# Patient Record
Sex: Female | Born: 1945
Health system: Southern US, Community
[De-identification: ages and names within clinical notes are randomized; demographics above are authoritative.]

## PROBLEM LIST (undated history)

## (undated) DIAGNOSIS — Z8541 Personal history of malignant neoplasm of cervix uteri: Secondary | ICD-10-CM

## (undated) DIAGNOSIS — N6019 Diffuse cystic mastopathy of unspecified breast: Secondary | ICD-10-CM

## (undated) DIAGNOSIS — I4891 Unspecified atrial fibrillation: Secondary | ICD-10-CM

## (undated) DIAGNOSIS — G629 Polyneuropathy, unspecified: Secondary | ICD-10-CM

## (undated) DIAGNOSIS — E785 Hyperlipidemia, unspecified: Secondary | ICD-10-CM

## (undated) DIAGNOSIS — I10 Essential (primary) hypertension: Secondary | ICD-10-CM

## (undated) DIAGNOSIS — Z8679 Personal history of other diseases of the circulatory system: Secondary | ICD-10-CM

## (undated) DIAGNOSIS — D649 Anemia, unspecified: Secondary | ICD-10-CM

## (undated) HISTORY — PX: BREAST EXCISIONAL BIOPSY: SUR124

## (undated) HISTORY — PX: TONSILLECTOMY: SUR1361

## (undated) HISTORY — DX: Hyperlipidemia, unspecified: E78.5

## (undated) HISTORY — PX: TOTAL ABDOMINAL HYSTERECTOMY: SHX209

## (undated) HISTORY — PX: BREAST CYST EXCISION: SHX579

## (undated) HISTORY — DX: Personal history of malignant neoplasm of cervix uteri: Z85.41

## (undated) HISTORY — DX: Anemia, unspecified: D64.9

## (undated) HISTORY — DX: Personal history of other diseases of the circulatory system: Z86.79

## (undated) HISTORY — PX: APPENDECTOMY: SHX54

## (undated) HISTORY — DX: Essential (primary) hypertension: I10

## (undated) HISTORY — DX: Unspecified atrial fibrillation: I48.91

## (undated) HISTORY — DX: Diffuse cystic mastopathy of unspecified breast: N60.19

## (undated) HISTORY — DX: Polyneuropathy, unspecified: G62.9

---

## 2003-10-29 ENCOUNTER — Encounter: Admission: RE | Admit: 2003-10-29 | Discharge: 2003-10-29 | Payer: Self-pay | Admitting: Internal Medicine

## 2004-11-14 ENCOUNTER — Encounter: Admission: RE | Admit: 2004-11-14 | Discharge: 2004-11-14 | Payer: Self-pay | Admitting: Internal Medicine

## 2005-02-27 ENCOUNTER — Ambulatory Visit: Payer: Self-pay | Admitting: Cardiovascular Disease

## 2005-03-12 ENCOUNTER — Ambulatory Visit: Payer: Self-pay | Admitting: Internal Medicine

## 2005-03-12 ENCOUNTER — Ambulatory Visit: Payer: Self-pay

## 2007-02-04 IMAGING — CT CT HEAD W/O CM
1 of 2 series · 16 of 30 positions shown, 20 images · non-contrast
Comparison: none

HISTORY: Dizziness, syncope, occasional hand numbness, remote history cervical
cancer

[Series 2: head_seq 4.5 h40s st · axial · 0.43mm/px · z∈[-128,-20]mm · 16 of 28 slices shown, 20 images]
[im 2/28  brain]
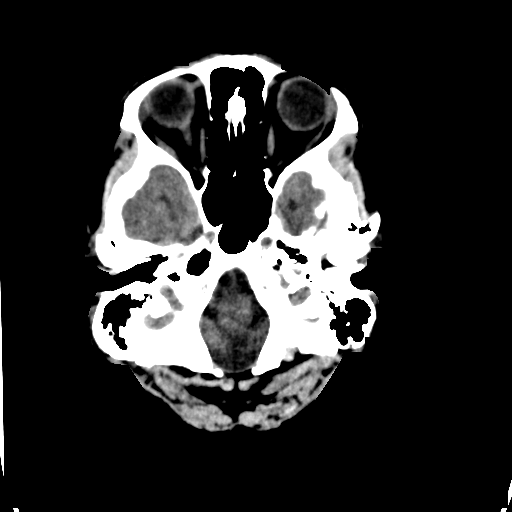
[im 2/28  bone]
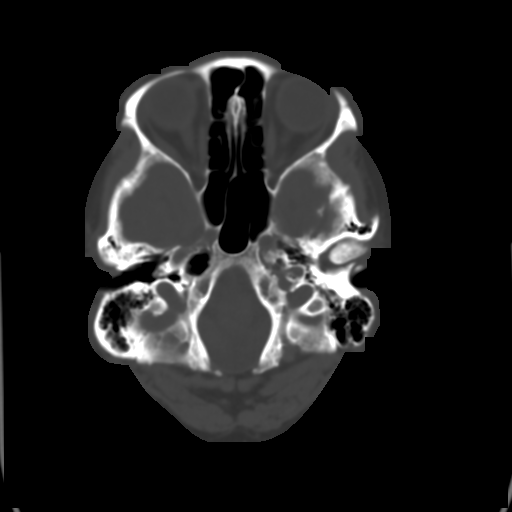
[im 4/28  brain]
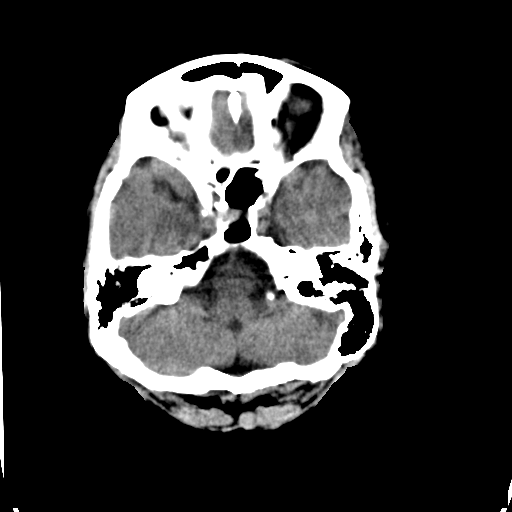
[im 5/28  brain]
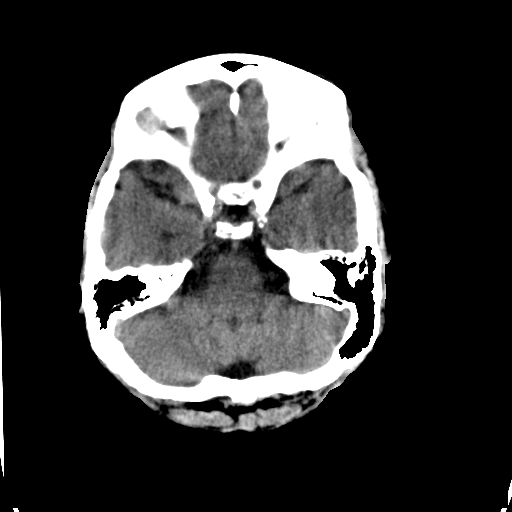
[im 7/28  brain]
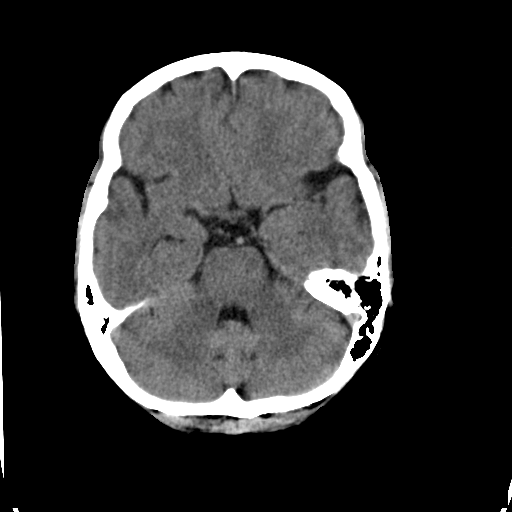
[im 8/28  brain]
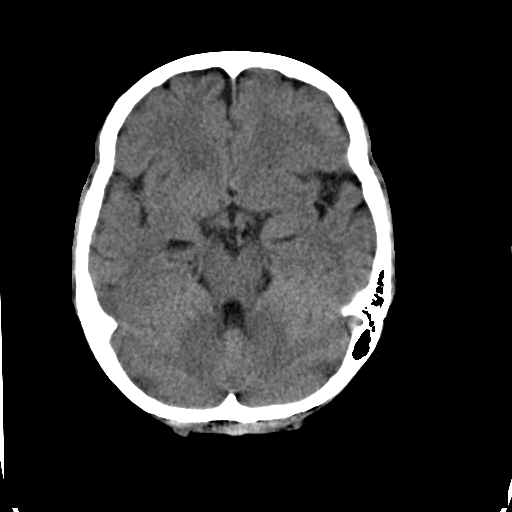
[im 8/28  bone]
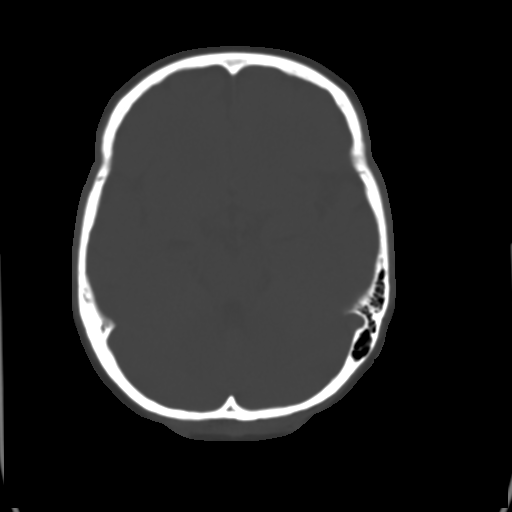
[im 10/28  brain]
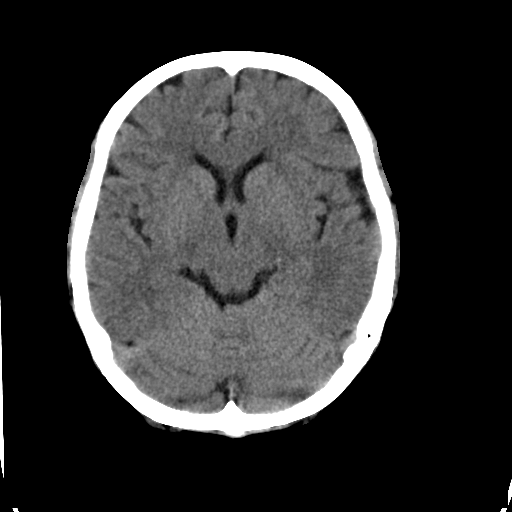
[im 11/28  brain]
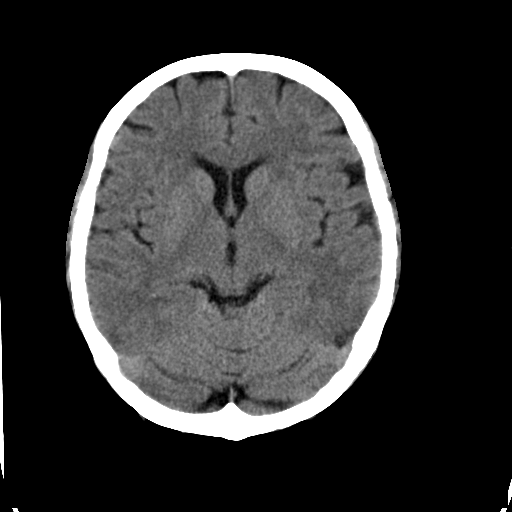
[im 13/28  brain]
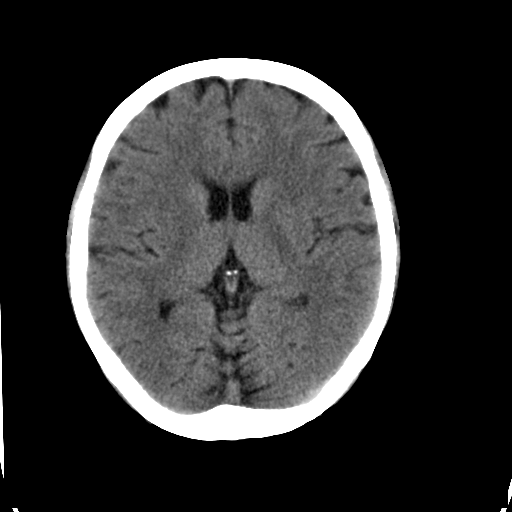
[im 15/28  brain]
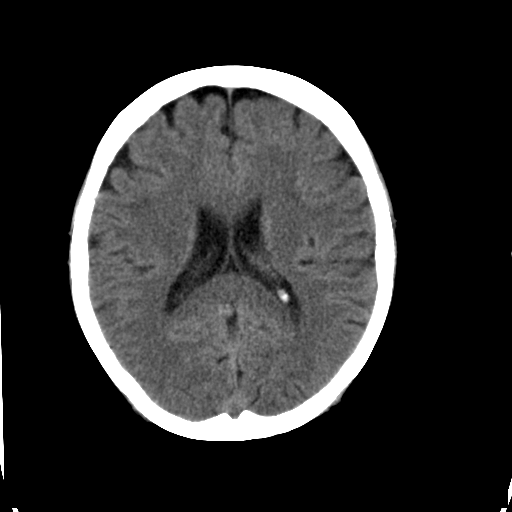
[im 15/28  bone]
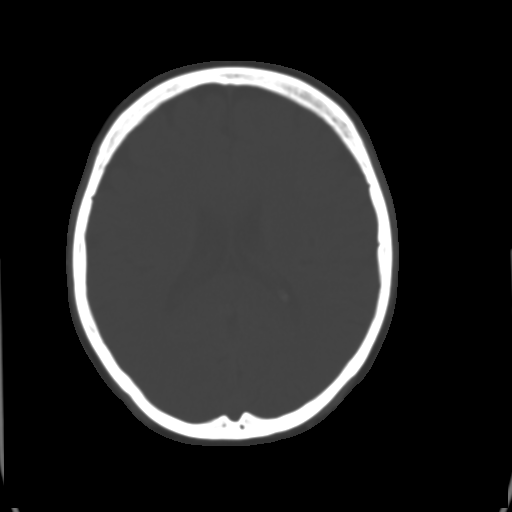
[im 17/28  brain]
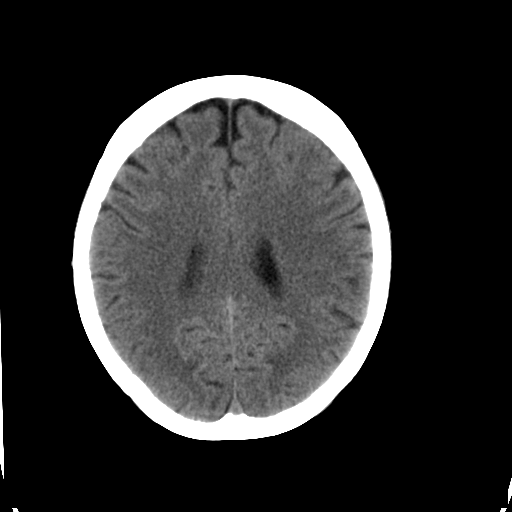
[im 18/28  brain]
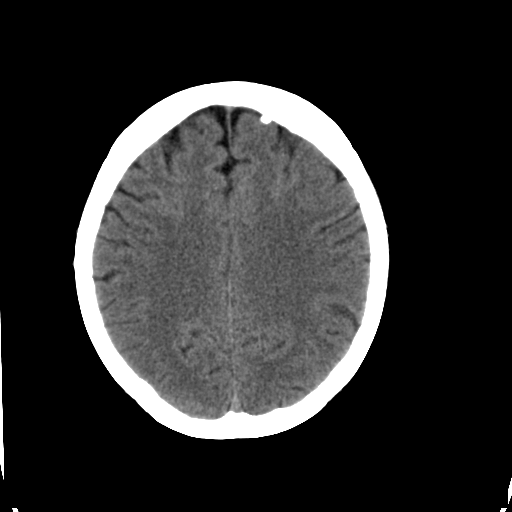
[im 20/28  brain]
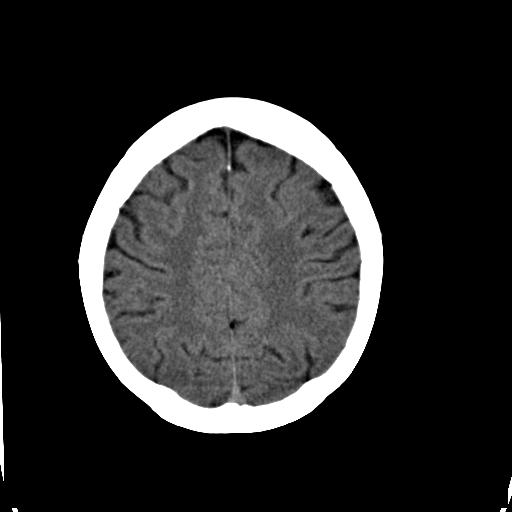
[im 21/28  brain]
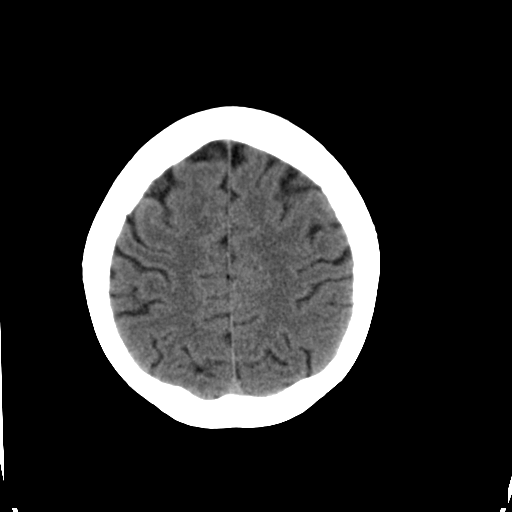
[im 21/28  bone]
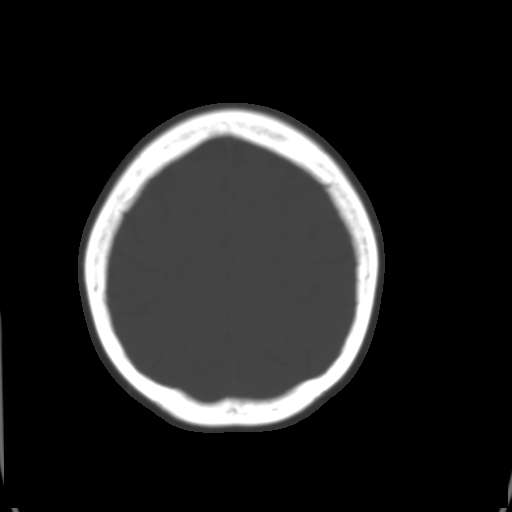
[im 23/28  brain]
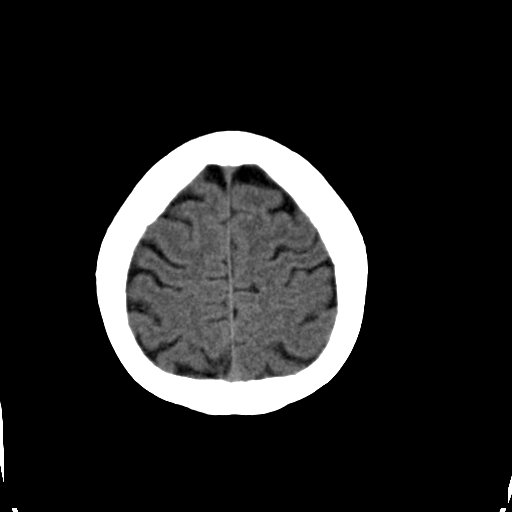
[im 24/28  brain]
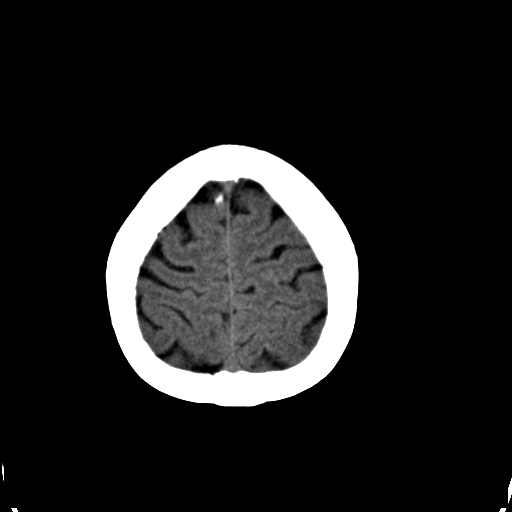
[im 26/28  brain]
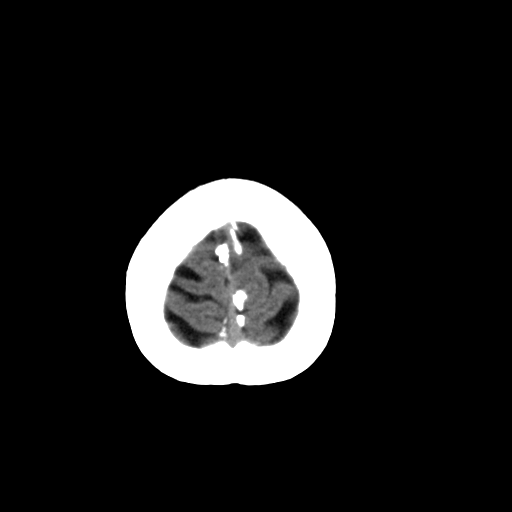

[16 of 30 positions shown; findings below may reference images not displayed]

CT HEAD WITHOUT CONTRAST:

Routine noncontrast CT head without priors for comparison

Normal ventricular morphology.
No midline shift or mass-effect.
Normal appearance of brain parenchyma without mass, hemorrhage, or infarction.
Visualized sinuses clear.
Significant aeration of petrous and temporal bones by mastoid air cells, which
appear clear.
IMPRESSION: No acute intracranial abnormality.

## 2007-04-15 ENCOUNTER — Encounter: Admission: RE | Admit: 2007-04-15 | Discharge: 2007-04-15 | Payer: Self-pay | Admitting: Internal Medicine

## 2007-05-10 ENCOUNTER — Emergency Department (HOSPITAL_COMMUNITY): Admission: EM | Admit: 2007-05-10 | Discharge: 2007-05-10 | Payer: Self-pay | Admitting: Emergency Medicine

## 2007-05-12 ENCOUNTER — Ambulatory Visit: Payer: Self-pay | Admitting: Cardiovascular Disease

## 2007-05-20 ENCOUNTER — Ambulatory Visit: Payer: Self-pay

## 2007-06-10 ENCOUNTER — Ambulatory Visit: Payer: Self-pay | Admitting: Cardiovascular Disease

## 2009-03-29 DIAGNOSIS — I10 Essential (primary) hypertension: Secondary | ICD-10-CM | POA: Insufficient documentation

## 2009-03-29 DIAGNOSIS — Z8541 Personal history of malignant neoplasm of cervix uteri: Secondary | ICD-10-CM | POA: Insufficient documentation

## 2009-03-29 DIAGNOSIS — E785 Hyperlipidemia, unspecified: Secondary | ICD-10-CM | POA: Insufficient documentation

## 2009-03-29 DIAGNOSIS — Z8679 Personal history of other diseases of the circulatory system: Secondary | ICD-10-CM | POA: Insufficient documentation

## 2010-09-07 ENCOUNTER — Encounter: Payer: Self-pay | Admitting: Internal Medicine

## 2010-12-30 NOTE — Assessment & Plan Note (Signed)
[facility]                            CARDIOLOGY OFFICE NOTE   Alicia Rodgers, Alicia Rodgers                         MRN:          [id number]  DATE:06/10/2007                            DOB:          1946/07/09    DATE OF VISIT:  June 10, 2007.   Alicia Rodgers returns today for followup.  She has a history of hypertension  and is much better controlled on Micardis; however, this medicine is  expensive for her and we need to come up with a substitute.  Lisinopril/HCTZ was not effective.  We had switched to Micardis-80 25,  and this was much better.  I told her I would look through the formulary  and see.  She uses the [facility] on [address] in [place].   Coronary risk factors include hypertension and hyperlipidemia.  She has  had no documented coronary artery disease.   She has been compliant with her meds.   REVIEW OF SYSTEMS:  Remarkable for continued dizziness with occasional  nauseated feelings.  This really sounds like more vertiginous or inner  ear problems; however, I cannot rule out temporal lobe migraines.   I think it is reasonable for her to be seen by a neurologist.  She has  had a CT scan before, which did not show any intracranial abnormalities.   From a cardiac perspective, she is otherwise stable.   CURRENT MEDICATIONS:  1. Lipitor 40 a day.  2. An aspirin a day.  3. Lisinopril/HCTZ, which was stopped.  4. Micardis-80 25, which is more effective but needs to substituted      for something cheaper.   PHYSICAL EXAMINATION:  VITAL SIGNS:  Blood pressure of 128/80, pulse is  70 to 85 and regular, afebrile, weight 156.  HEENT:  Unremarkable.  NECK:  Carotids are normal without bruit, no lymphadenopathy, no  thyromegaly, and no JVP elevation.  LUNGS:  Clear with good diaphragmatic motion and no wheezing.  HEART:  There is an S1 and S2 with normal heart sounds, the PMI is  normal.  ABDOMEN:  Benign, bowel sounds are positive, there is no  hepatosplenomegaly or hepatojugular reflux.  EXTREMITIES:  Distal pulses are intact with no edema.  NEURO:  Nonfocal, no muscular weakness.  SKIN:  Warm and dry.   IMPRESSION:  1. Hypertension currently well controlled.  Will try to find a generic      form of angiotensin-II receptor blocker (ARB) with diuretic to      substitute, continue a low salt diet.  2. Hyperlipidemia.  Continue on Lipitor 40 mg a day, a lipid and liver      profile in six months.  3. Vertiginous-like symptoms.  Rule out temporal lobe migraines.      Check electroencephalogram and have the patient see Dr. Marcelino Rodgers in Neurology.   Apparently Santina Evans sees [place], and [place] requested her as the  neurologist.     Theron Arista C. Eden Emms, MD, [facility]  Electronically Signed    PCN/MedQ  DD: 06/10/2007  DT: 06/11/2007  Job #: [id number] [id number]

## 2010-12-30 NOTE — Assessment & Plan Note (Signed)
[facility]                            CARDIOLOGY OFFICE NOTE   JANAUTICA, NETZLEY                         MRN:          [id number]  DATE:05/12/2007                            DOB:          17-Feb-1946    Ms. Alicia Rodgers is seen today as a new patient.  She is 65 years old.  I have  taken care of her husband for a long time.  She was in the emergency  room on May 10, 2007 with dizziness and hypertension.   The patient has had dizziness before.  She has been diagnosed with  tinnitus or vertigo.  She, I believe 2 years ago, had a workup including  an MRI, and there was no evidence of vertebrobasilar insufficiency, CVA,  or cardiac problem.   She does have long-standing hypertension.  She has been on lisinopril  hydrochlorothiazide 10/12.5 for about 10 years.  I am not sure why her  blood pressure would be spiking recently, although she is under a lot of  stress.  She works at [employer]. [employer], which is a [employer].  Apparently, both of her bosses have been sick, and she has been  basically running the company.  This is clearly a stressor for her.  She  was told by the ER doctor to take 2 of her lisinopril pills and followup  with a cardiologist.   Her coronary risk factors include a family history of coronary disease,  hyperlipidemia, and hypertension.  She is a nonsmoker.  Non-diabetic.   She has not had any previous workup with regard to coronary artery  disease or valvular heart disease.  I do not have a recent echo on her.   Her dizziness is not associated with any significant pre-syncope or  falls.  There are no palpitations.  She has occasionally gotten pressure  in her chest, which sounds more like anxiety.  Apparently, Dr. Sudie Bailey,  who knows her in [place], did not want to give her any Xanax until she  saw me.  I think it is okay for her to have some.   I talked to [place] at length.  I think we will switch her to Micardis  80/25  just to simplify her regimen so that she does not have to take 2  pills, and see how she does.  May need to add a second blood pressure  medicine depending on how she reacts to this change.  I told her we  would have her come back in about 2 weeks to reassess her blood pressure  and do a stress Myoview.  The pressure in her chest is related to her  stress levels at work.  It can be exacerbated by walking.  There is no  associated diaphoresis or shortness of breath.  I am not 100% sure it is  not an anginal equivalent and we will check her stress test next week.   REVIEW OF SYSTEMS:  Otherwise, negative.   PAST MEDICAL HISTORY:  Remarkable for hypertension, cervical cancer, and  is otherwise fairly benign.  She is allergic to  Penicillin.  She is  currently taking:  1. Lipitor 40 a day.  2. An aspirin a day.  3. Lisinopril 20/25.   She is happily married.  I have cared for her husband, Alicia Rodgers  for over 14 years.  One of her 3 daughters is with her.  They are a  lovely family from the [place] area.  She does a lot of work around the  house.  She is still working full time at [employer].  She does not smoke  or drink.   FAMILY HISTORY:  Noncontributory, except there is premature coronary  disease on her father's side.   EXAM:  Remarkable for a somewhat anxious white female in no distress.  Her blood pressure is 150/85, pulse 71 and regular, weight is 156.  Respiratory rate is 14. She is afebrile.  Affect is anxious.  HEENT:  Normal.  Carotids normal without bruit.  There is no lymphadenopathy or  thyromegaly.  NO JVP elevation.  LUNGS:  Clear.  Good diaphragmatic motion.  No wheezing.  ABDOMEN:  Benign.  Bowel sounds positive.  There are no renal bruits.  No AAA.  No hepatosplenomegaly.  No hepatojugular reflux.  Femorals are +2 bilaterally without bruit.  PT +3.  There is no lower  extremity edema.  NEURO:  Nonfocal.  SKIN:  Warm and dry.   She does have mild EKG  changes that may represent LVH.  She has slight  ST depression in the inferior lateral leads.   I reviewed her lab work from [facility].  Hematocrit was 38.3,  white count was 5.9, platelet count was 238,000.   She apparently was recently treated for a UTI by Dr. Sudie Bailey.  The UA  was negative for nitrite and leukocyte esterase with no white blood  cells.   IMPRESSION:  1. Hypertension, suspect it is exacerbated by her anxiety levels.      P.r.n. Xanax 0.25 mg a day given, Micardis 80/25 given, follow up      in 2 weeks.  2. Pressure in her chest, likely related to anxiety.  Followup stress      Myoview, particularly in light of her abnormal EKG.  3. Hyperlipidemia.  Continue Lipitor 40 a day.  Lipid and liver      profile per Dr. Sudie Bailey in 6 months.  4. History of cervical cancer.  Follow up with OB/GYN regarding yearly      Pap smears.  5. Recently-treated urinary tract infection.  UA negative in the      emergency room.  Symptoms resolved.  Follow up with primary care      physician.   Other recommendations based on the results of the medication change and  her stress test.     Theron Arista C. Eden Emms, MD, [facility]  Electronically Signed    PCN/MedQ  DD: 05/12/2007  DT: 05/13/2007  Job #: [id number]

## 2011-05-28 LAB — POCT I-STAT CREATININE
Creatinine, Ser: 1
Operator id: 198171

## 2011-05-28 LAB — URINALYSIS, ROUTINE W REFLEX MICROSCOPIC
Glucose, UA: NEGATIVE
Hgb urine dipstick: NEGATIVE
Ketones, ur: NEGATIVE
Protein, ur: NEGATIVE
Urobilinogen, UA: 0.2
pH: 8

## 2011-05-28 LAB — I-STAT 8, (EC8 V) (CONVERTED LAB)
Chloride: 107
Glucose, Bld: 123 — ABNORMAL HIGH
HCT: 41
Operator id: 198171
TCO2: 26
pCO2, Ven: 34.2 — ABNORMAL LOW
pH, Ven: 7.468 — ABNORMAL HIGH

## 2011-05-28 LAB — CBC
Hemoglobin: 13.4
RDW: 13.5

## 2011-05-28 LAB — POCT CARDIAC MARKERS
CKMB, poc: 1.2
Troponin i, poc: <0.05

## 2011-08-21 DIAGNOSIS — J019 Acute sinusitis, unspecified: Secondary | ICD-10-CM | POA: Diagnosis not present

## 2011-08-21 DIAGNOSIS — J209 Acute bronchitis, unspecified: Secondary | ICD-10-CM | POA: Diagnosis not present

## 2011-10-07 DIAGNOSIS — M79609 Pain in unspecified limb: Secondary | ICD-10-CM | POA: Diagnosis not present

## 2011-10-07 DIAGNOSIS — I10 Essential (primary) hypertension: Secondary | ICD-10-CM | POA: Diagnosis not present

## 2011-10-07 DIAGNOSIS — Z79899 Other long term (current) drug therapy: Secondary | ICD-10-CM | POA: Diagnosis not present

## 2011-10-07 DIAGNOSIS — E785 Hyperlipidemia, unspecified: Secondary | ICD-10-CM | POA: Diagnosis not present

## 2011-10-07 DIAGNOSIS — F411 Generalized anxiety disorder: Secondary | ICD-10-CM | POA: Diagnosis not present

## 2011-10-07 DIAGNOSIS — D649 Anemia, unspecified: Secondary | ICD-10-CM | POA: Diagnosis not present

## 2011-11-02 ENCOUNTER — Encounter: Payer: Self-pay | Admitting: Internal Medicine

## 2011-11-09 DIAGNOSIS — D539 Nutritional anemia, unspecified: Secondary | ICD-10-CM | POA: Diagnosis not present

## 2011-11-19 DIAGNOSIS — Z1231 Encounter for screening mammogram for malignant neoplasm of breast: Secondary | ICD-10-CM | POA: Diagnosis not present

## 2011-12-04 ENCOUNTER — Encounter: Payer: Self-pay | Admitting: *Deleted

## 2011-12-04 ENCOUNTER — Encounter: Payer: Self-pay | Admitting: Cardiovascular Disease

## 2011-12-07 ENCOUNTER — Encounter: Payer: Self-pay | Admitting: Cardiovascular Disease

## 2011-12-07 ENCOUNTER — Ambulatory Visit (INDEPENDENT_AMBULATORY_CARE_PROVIDER_SITE_OTHER): Payer: Medicare Other | Admitting: Cardiovascular Disease

## 2011-12-07 VITALS — BP 142/77 | HR 92 | Ht 62.0 in | Wt 153.0 lb

## 2011-12-07 DIAGNOSIS — I10 Essential (primary) hypertension: Secondary | ICD-10-CM

## 2011-12-07 DIAGNOSIS — I4949 Other premature depolarization: Secondary | ICD-10-CM

## 2011-12-07 DIAGNOSIS — E785 Hyperlipidemia, unspecified: Secondary | ICD-10-CM | POA: Diagnosis not present

## 2011-12-07 DIAGNOSIS — I493 Ventricular premature depolarization: Secondary | ICD-10-CM | POA: Insufficient documentation

## 2011-12-07 MED ORDER — NEBIVOLOL HCL 10 MG PO TABS: 10.0000 mg | ORAL_TABLET | Freq: Every day | ORAL | Status: DC

## 2011-12-07 NOTE — Assessment & Plan Note (Signed)
Cholesterol is at goal.  Continue current dose of statin and diet Rx.  No myalgias or side effects.  F/U  LFT's in 6 months. No results found for this basename: LDLCALC             

## 2011-12-07 NOTE — Patient Instructions (Signed)
Start Bystolic 10mg  daily.  Your physician recommends that you schedule a follow-up appointment in: 6 weeks with Dr. Eden Emms.

## 2011-12-07 NOTE — Assessment & Plan Note (Signed)
High adrenergic tone from stress.  Beta blocker should help.  F/U 4-6 weeks

## 2011-12-07 NOTE — Assessment & Plan Note (Signed)
Suboptimally controlled.  Add bystolic 10 mg  F/U 4-6 weeks

## 2011-12-07 NOTE — Progress Notes (Signed)
Alicia Rodgers returns today for followup. She has a history of hypertension and is much better controlled on Micardis; however, this medicine is expensive for her and we need to come up with a substitute. Lisinopril/HCTZ was not effective. We had switched to Micardis-80 25, and this was much better. I told her I would look through the formulary and see. She uses the [facility] on [address] in [place]. Coronary risk factors include hypertension and hyperlipidemia. She has had no documented coronary artery disease. She has been compliant with her meds.  Under a lot of stress Expenses at home.  Thinking of retiring  Wants to be close to grand babies at [facility]. Had lab work a month ago with primary in [place]  Anemic and to have colonoscopy with Dr Juanda Chance next month  ROS: Denies fever, malais, weight loss, blurry vision, decreased visual acuity, cough, sputum, SOB, hemoptysis, pleuritic pain, palpitaitons, heartburn, abdominal pain, melena, lower extremity edema, claudication, or rash.  All other systems reviewed and negative  General: Affect appropriate Healthy:  appears stated age HEENT: normal Neck supple with no adenopathy JVP normal no bruits no thyromegaly Lungs clear with no wheezing and good diaphragmatic motion Heart:  S1/S2 no murmur, no rub, gallop or click PMI normal Abdomen: benighn, BS positve, no tenderness, no AAA no bruit.  No HSM or HJR Distal pulses intact with no bruits No edema Neuro non-focal Skin warm and dry No muscular weakness   No current outpatient prescriptions on file.    Allergies  Review of patient's allergies indicates not on file.  Electrocardiogram: NSR rate 92  PVC's nonspecific ST/T wave changes  Assessment and Plan

## 2011-12-09 DIAGNOSIS — E7800 Pure hypercholesterolemia, unspecified: Secondary | ICD-10-CM | POA: Diagnosis not present

## 2011-12-09 DIAGNOSIS — R002 Palpitations: Secondary | ICD-10-CM | POA: Diagnosis not present

## 2011-12-09 DIAGNOSIS — K219 Gastro-esophageal reflux disease without esophagitis: Secondary | ICD-10-CM | POA: Diagnosis not present

## 2011-12-09 DIAGNOSIS — I4891 Unspecified atrial fibrillation: Secondary | ICD-10-CM | POA: Diagnosis not present

## 2011-12-09 DIAGNOSIS — I4949 Other premature depolarization: Secondary | ICD-10-CM | POA: Diagnosis not present

## 2011-12-09 DIAGNOSIS — Z79899 Other long term (current) drug therapy: Secondary | ICD-10-CM | POA: Diagnosis not present

## 2011-12-09 DIAGNOSIS — F411 Generalized anxiety disorder: Secondary | ICD-10-CM | POA: Diagnosis not present

## 2011-12-09 DIAGNOSIS — Z7982 Long term (current) use of aspirin: Secondary | ICD-10-CM | POA: Diagnosis not present

## 2011-12-10 NOTE — Progress Notes (Signed)
Addended by: Reine Just on: 12/10/2011 04:08 PM   Modules accepted: Orders

## 2011-12-11 DIAGNOSIS — H6120 Impacted cerumen, unspecified ear: Secondary | ICD-10-CM | POA: Diagnosis not present

## 2011-12-11 DIAGNOSIS — F411 Generalized anxiety disorder: Secondary | ICD-10-CM | POA: Diagnosis not present

## 2011-12-11 DIAGNOSIS — R002 Palpitations: Secondary | ICD-10-CM | POA: Diagnosis not present

## 2011-12-21 ENCOUNTER — Encounter: Payer: Self-pay | Admitting: *Deleted

## 2011-12-21 DIAGNOSIS — H6120 Impacted cerumen, unspecified ear: Secondary | ICD-10-CM | POA: Diagnosis not present

## 2011-12-29 ENCOUNTER — Encounter: Payer: Self-pay | Admitting: Internal Medicine

## 2011-12-29 ENCOUNTER — Ambulatory Visit (INDEPENDENT_AMBULATORY_CARE_PROVIDER_SITE_OTHER): Payer: Medicare Other | Admitting: Internal Medicine

## 2011-12-29 VITALS — BP 114/62 | HR 64 | Ht 62.0 in | Wt 152.8 lb

## 2011-12-29 DIAGNOSIS — D509 Iron deficiency anemia, unspecified: Secondary | ICD-10-CM | POA: Diagnosis not present

## 2011-12-29 MED ORDER — FERROUS SULFATE 325 (65 FE) MG PO TBEC: 325.0000 mg | DELAYED_RELEASE_TABLET | Freq: Every day | ORAL | Status: AC

## 2011-12-29 MED ORDER — PEG-KCL-NACL-NASULF-NA ASC-C 100 G PO SOLR: 1.0000 | Freq: Once | ORAL | Status: DC

## 2011-12-29 NOTE — Progress Notes (Signed)
Alicia Rodgers 1946/03/13 MRN [id number]    History of Present Illness:  This is a 66 year old white female with mild anemia. In February 2013, her hemoglobin was 11.4 and hematocrit was 35.3. According to our records, she had a hemoglobin of 13.9 in 2008. She denies any rectal bleeding, abdominal pain or family history of colon cancer. Her husband passed away 3 years ago and she has not been taking care of herself. She has had recent palpitations which are in the process of being evaluated by Dr.Nishan. She has never had a colonoscopy. She has been treated for high blood pressure. There is a history of cervical carcinoma for which she is status post radical hysterectomy at age 77. There is no history of radiation. She denies taking any nonsteroidal agents or aspirin.   Past Medical History  Diagnosis Date  . HYPERLIPIDEMIA   . HYPERTENSION   . CERVICAL CANCER, HX OF   . RHEUMATIC FEVER, HX OF   . Anemia   . Fibrocystic breast    Past Surgical History  Procedure Date  . Total abdominal hysterectomy   . Breast cyst excision   . Tonsillectomy   . Cesarean section     x3  . Appendectomy     reports that she has never smoked. She has never used smokeless tobacco. She reports that she does not drink alcohol or use illicit drugs. family history includes Breast cancer in her paternal aunt; Heart disease in her father; Stroke in her mother; and Thyroid cancer in her paternal aunt.  There is no history of Colon cancer. Allergies  Allergen Reactions  . Penicillins         Review of Systems:Denies symptoms of reflux. Dysphagia chest pain, positive for palpitations  The remainder of the 10 point ROS is negative except as outlined in H&P   Physical Exam: General appearance  Well developed, in no distress. Eyes- non icteric. HEENT nontraumatic, normocephalic. Mouth no lesions, tongue papillated, no cheilosis. Neck supple without adenopathy, thyroid not enlarged, no carotid bruits, no  JVD. Lungs Clear to auscultation bilaterally. Cor normal S1, normal S2, regular rhythm, no murmur,  quiet precordium. Abdomen: Soft nontender with normal active bowel sounds. No distention. Liver edge at costal margin. Well-healed surgical scar below umbilicus. Rectal:Soft Hemoccult negative stool. Small external hemorrhoidal tags.  Extremities no pedal edema. Skin no lesions. Neurological alert and oriented x 3. Psychological normal mood and affect.  Assessment and Plan:  Problem #75 66 year old white female with mild anemia. She had surgical menopause at age 58. She has a history of cervical cancer. There are no overt symptoms of GI malignancy such as abdominal pain or Hemoccult positive stool. She has never had a colonoscopy. She is a good candidate for a screening colonoscopy. We have discussed the procedure, the prep and the sedation. She would like to have the colonoscopy done after she sees Dr.Nishan in June. I asked her to take over-the-counter iron supplements and stop it one week prior to the scheduled colonoscopy.   12/29/2011 Alicia Rodgers

## 2011-12-29 NOTE — Patient Instructions (Signed)
You have been scheduled for a colonoscopy with propofol. Please follow written instructions given to you at your visit today.  Please pick up your prep kit at the pharmacy within the next 1-3 days. Please purchase ferrous sulfate 325 mg (Iron) over the counter and take once daily. Please hold ferrous sulfate 1 week prior to colonoscopy as per Dr Regino Schultze recommendations. CC: Dr Sudie Bailey

## 2012-01-25 ENCOUNTER — Ambulatory Visit (INDEPENDENT_AMBULATORY_CARE_PROVIDER_SITE_OTHER): Payer: Medicare Other | Admitting: Cardiovascular Disease

## 2012-01-25 ENCOUNTER — Encounter: Payer: Self-pay | Admitting: Cardiovascular Disease

## 2012-01-25 VITALS — BP 138/72 | HR 74 | Ht 62.0 in | Wt 152.0 lb

## 2012-01-25 DIAGNOSIS — I493 Ventricular premature depolarization: Secondary | ICD-10-CM

## 2012-01-25 DIAGNOSIS — E785 Hyperlipidemia, unspecified: Secondary | ICD-10-CM

## 2012-01-25 DIAGNOSIS — I4949 Other premature depolarization: Secondary | ICD-10-CM | POA: Diagnosis not present

## 2012-01-25 DIAGNOSIS — I10 Essential (primary) hypertension: Secondary | ICD-10-CM | POA: Diagnosis not present

## 2012-01-25 MED ORDER — ATENOLOL 50 MG PO TABS: 50.0000 mg | ORAL_TABLET | Freq: Every day | ORAL | Status: DC

## 2012-01-25 NOTE — Assessment & Plan Note (Signed)
Cholesterol is at goal.  Continue current dose of statin and diet Rx.  No myalgias or side effects.  F/U  LFT's in 6 months. No results found for this basename: LDLCALC             

## 2012-01-25 NOTE — Assessment & Plan Note (Signed)
Well controlled.  Continue current medications and low sodium Dash type diet.   Change bystolic to atenolol for cost

## 2012-01-25 NOTE — Patient Instructions (Signed)
Your physician wants you to follow-up in:  6 MONTHS WITH DR Haywood Filler will receive a reminder letter in the mail two months in advance. If you don't receive a letter, please call our office to schedule the follow-up appointment. Your physician has recommended you make the following change in your medication: STOP BYSTOLIC 10 MG AND START GEN ATENOLOL 50 MG  EVERY DAY

## 2012-01-25 NOTE — Assessment & Plan Note (Signed)
Stable palpitations only when she is anxious  Continue beta blocker

## 2012-01-25 NOTE — Progress Notes (Signed)
Patient ID: CHANNA HAZELETT, female   DOB: 10/21/1945, 66 y.o.   MRN: [id number] Ms. Matherne returns today for followup. She has a history of hypertension and is much better controlled on Micardis; however, this medicine is expensive for her and we need to come up with a substitute. Lisinopril/HCTZ was not effective. Currently on cozaar and diuretic  Bystolic too expensive and will need to change  Coronary risk factors include hypertension and hyperlipidemia. She has had no documented coronary artery disease. She has been compliant with her meds. Under a lot of stress Expenses at home. Thinking of retiring Wants to be close to grand babies at [facility]. Had lab work a month ago with primary in [place] Anemic  Colonoscopy next month.  Still some melancholy days.  Daughter to be married next April and she may move to [place] after wedding.  Her twin grandsons are now 3  ROS: Denies fever, malais, weight loss, blurry vision, decreased visual acuity, cough, sputum, SOB, hemoptysis, pleuritic pain, palpitaitons, heartburn, abdominal pain, melena, lower extremity edema, claudication, or rash.  All other systems reviewed and negative  General: Affect appropriate Healthy:  appears stated age HEENT: normal Neck supple with no adenopathy JVP normal no bruits no thyromegaly Lungs clear with no wheezing and good diaphragmatic motion Heart:  S1/S2 no murmur, no rub, gallop or click PMI normal Abdomen: benighn, BS positve, no tenderness, no AAA no bruit.  No HSM or HJR Distal pulses intact with no bruits No edema Neuro non-focal Skin warm and dry No muscular weakness   Current Outpatient Prescriptions  Medication Sig Dispense Refill  . ALPRAZolam (XANAX) 0.5 MG tablet Take 0.5 mg by mouth at bedtime as needed.      Marland Kitchen aspirin 81 MG tablet Take 81 mg by mouth daily.      . cetirizine (ZYRTEC) 10 MG tablet Take 10 mg by mouth as needed.      . Cholecalciferol 1000 UNITS capsule Take 1,000 Units by mouth daily.       . cyanocobalamin 500 MCG tablet Take 1,000 mcg by mouth daily.      . ferrous sulfate 325 (65 FE) MG EC tablet Take 1 tablet (325 mg total) by mouth daily.  1 tablet  0  . losartan (COZAAR) 100 MG tablet Take 100 mg by mouth daily.      . nebivolol (BYSTOLIC) 10 MG tablet Take 1 tablet (10 mg total) by mouth daily.  30 tablet  3  . omeprazole (PRILOSEC) 40 MG capsule Take 40 mg by mouth as needed.      . peg 3350 powder (MOVIPREP) 100 G SOLR Take 1 kit (100 g total) by mouth once. As directed  1 kit  0  . pravastatin (PRAVACHOL) 10 MG tablet Take 10 mg by mouth every other day.       . triamterene-hydrochlorothiazide (DYAZIDE) 37.5-25 MG per capsule Take 1 capsule by mouth every morning.        Allergies  Penicillins  Electrocardiogram:  Assessment and Plan

## 2012-02-04 DIAGNOSIS — E559 Vitamin D deficiency, unspecified: Secondary | ICD-10-CM | POA: Diagnosis not present

## 2012-02-04 DIAGNOSIS — D509 Iron deficiency anemia, unspecified: Secondary | ICD-10-CM | POA: Diagnosis not present

## 2012-02-04 DIAGNOSIS — Z79899 Other long term (current) drug therapy: Secondary | ICD-10-CM | POA: Diagnosis not present

## 2012-02-04 DIAGNOSIS — F411 Generalized anxiety disorder: Secondary | ICD-10-CM | POA: Diagnosis not present

## 2012-02-04 DIAGNOSIS — I10 Essential (primary) hypertension: Secondary | ICD-10-CM | POA: Diagnosis not present

## 2012-02-04 DIAGNOSIS — E785 Hyperlipidemia, unspecified: Secondary | ICD-10-CM | POA: Diagnosis not present

## 2012-02-19 ENCOUNTER — Encounter: Payer: Medicare Other | Admitting: Internal Medicine

## 2012-02-23 ENCOUNTER — Telehealth: Payer: Self-pay | Admitting: *Deleted

## 2012-02-23 NOTE — Telephone Encounter (Signed)
Pt called concerned because when she last saw dr Eden Emms he changed her bystolic to atenolol and the dosage went from 10 mg to 50 mg. Explained to pt was a difference in the meds. She will start with just 1/2 tablet of atenolol daily. She will track her bp and if elevated she can then increase to 50 mg daily. She will track her bp and bring the readings top the next appt with dr Eden Emms.

## 2012-03-07 DIAGNOSIS — R7989 Other specified abnormal findings of blood chemistry: Secondary | ICD-10-CM | POA: Diagnosis not present

## 2012-05-06 DIAGNOSIS — I10 Essential (primary) hypertension: Secondary | ICD-10-CM | POA: Diagnosis not present

## 2012-05-06 DIAGNOSIS — F411 Generalized anxiety disorder: Secondary | ICD-10-CM | POA: Diagnosis not present

## 2012-05-06 DIAGNOSIS — E785 Hyperlipidemia, unspecified: Secondary | ICD-10-CM | POA: Diagnosis not present

## 2012-05-06 DIAGNOSIS — K219 Gastro-esophageal reflux disease without esophagitis: Secondary | ICD-10-CM | POA: Diagnosis not present

## 2012-05-19 DIAGNOSIS — N390 Urinary tract infection, site not specified: Secondary | ICD-10-CM | POA: Diagnosis not present

## 2012-06-08 DIAGNOSIS — Z23 Encounter for immunization: Secondary | ICD-10-CM | POA: Diagnosis not present

## 2012-06-14 DIAGNOSIS — J209 Acute bronchitis, unspecified: Secondary | ICD-10-CM | POA: Diagnosis not present

## 2012-06-14 DIAGNOSIS — Z79899 Other long term (current) drug therapy: Secondary | ICD-10-CM | POA: Diagnosis not present

## 2012-06-14 DIAGNOSIS — F411 Generalized anxiety disorder: Secondary | ICD-10-CM | POA: Diagnosis not present

## 2012-06-14 DIAGNOSIS — D649 Anemia, unspecified: Secondary | ICD-10-CM | POA: Diagnosis not present

## 2012-06-14 DIAGNOSIS — I10 Essential (primary) hypertension: Secondary | ICD-10-CM | POA: Diagnosis not present

## 2012-06-14 DIAGNOSIS — E785 Hyperlipidemia, unspecified: Secondary | ICD-10-CM | POA: Diagnosis not present

## 2012-06-20 DIAGNOSIS — D649 Anemia, unspecified: Secondary | ICD-10-CM | POA: Diagnosis not present

## 2012-07-18 DIAGNOSIS — Z79899 Other long term (current) drug therapy: Secondary | ICD-10-CM | POA: Diagnosis not present

## 2012-07-27 ENCOUNTER — Ambulatory Visit (INDEPENDENT_AMBULATORY_CARE_PROVIDER_SITE_OTHER): Payer: Medicare Other | Admitting: Cardiovascular Disease

## 2012-07-27 ENCOUNTER — Encounter: Payer: Self-pay | Admitting: Cardiovascular Disease

## 2012-07-27 VITALS — BP 159/84 | HR 65 | Resp 16 | Ht 62.0 in | Wt 153.0 lb

## 2012-07-27 DIAGNOSIS — E785 Hyperlipidemia, unspecified: Secondary | ICD-10-CM

## 2012-07-27 DIAGNOSIS — I493 Ventricular premature depolarization: Secondary | ICD-10-CM

## 2012-07-27 DIAGNOSIS — I10 Essential (primary) hypertension: Secondary | ICD-10-CM

## 2012-07-27 DIAGNOSIS — I4949 Other premature depolarization: Secondary | ICD-10-CM

## 2012-07-27 NOTE — Assessment & Plan Note (Signed)
Well controlled.  Continue current medications and low sodium Dash type diet.   Component of white coat.  Samples bystolic given

## 2012-07-27 NOTE — Assessment & Plan Note (Signed)
Resolved no palpitations continue beta blocker

## 2012-07-27 NOTE — Assessment & Plan Note (Signed)
Cholesterol is at goal.  Continue current dose of statin and diet Rx.  No myalgias or side effects.  F/U  LFT's in 6 months. No results found for this basename: LDLCALC  Labs with primary            

## 2012-07-27 NOTE — Progress Notes (Signed)
Patient ID: Alicia Rodgers, female   DOB: [birth year], 66 y.o.   MRN: [id number] Alicia Rodgers returns today for followup. She has a history of hypertension and is much better controlled on Micardis; however, this medicine is expensive for her and we need to come up with a substitute. Lisinopril/HCTZ was not effective. Currently on cozaar and diuretic Bystolic too expensive and will need to change Coronary risk factors include hypertension and hyperlipidemia. She has had no documented coronary artery disease. She has been compliant with her meds. Under a lot of stress Expenses at home. Thinking of retiring Wants to be close to grand babies at [facility]. Had lab work a month ago with primary in [place] Anemic Colonoscopy next month.  Still some melancholy days. Daughter to be married next April and she may move to [place] after wedding. Her twin grandsons are now 76  Alicia Rodgers and Alicia Rodgers will be moving this way in January  ROS: Denies fever, malais, weight loss, blurry vision, decreased visual acuity, cough, sputum, SOB, hemoptysis, pleuritic pain, palpitaitons, heartburn, abdominal pain, melena, lower extremity edema, claudication, or rash.  All other systems reviewed and negative  General: Affect appropriate Healthy:  appears stated age HEENT: normal Neck supple with no adenopathy JVP normal no bruits no thyromegaly Lungs clear with no wheezing and good diaphragmatic motion Heart:  S1/S2 no murmur, no rub, gallop or click PMI normal Abdomen: benighn, BS positve, no tenderness, no AAA no bruit.  No HSM or HJR Distal pulses intact with no bruits No edema Neuro non-focal Skin warm and dry No muscular weakness   Current Outpatient Prescriptions  Medication Sig Dispense Refill  . ALPRAZolam (XANAX) 0.5 MG tablet Take 0.5 mg by mouth at bedtime as needed.      Marland Kitchen aspirin 81 MG tablet Take 81 mg by mouth daily.      Marland Kitchen atenolol (TENORMIN) 50 MG tablet Take 1 tablet (50 mg total) by mouth daily.  30 tablet  11   . cetirizine (ZYRTEC) 10 MG tablet Take 10 mg by mouth as needed.      . Cholecalciferol 1000 UNITS capsule Take 1,000 Units by mouth daily.      . cyanocobalamin 500 MCG tablet Take 1,000 mcg by mouth daily.      . ferrous sulfate 325 (65 FE) MG EC tablet Take 1 tablet (325 mg total) by mouth daily.  1 tablet  0  . hydrochlorothiazide (HYDRODIURIL) 12.5 MG tablet Take 12.5 mg by mouth daily.      Marland Kitchen losartan (COZAAR) 100 MG tablet Take 100 mg by mouth daily.      . nebivolol (BYSTOLIC) 10 MG tablet Take 10 mg by mouth daily.      Marland Kitchen omeprazole (PRILOSEC) 40 MG capsule Take 40 mg by mouth as needed.      . pravastatin (PRAVACHOL) 10 MG tablet Take 10 mg by mouth every other day.         Allergies  Penicillins  Electrocardiogram:  NSR normal ECG no LVH 12/07/11  Assessment and Plan

## 2012-07-27 NOTE — Patient Instructions (Addendum)
Your physician recommends that you continue on your current medications as directed. Please refer to the Current Medication list given to you today.  Your physician wants you to follow-up in: 6 months. You will receive a reminder letter in the mail two months in advance. If you don't receive a letter, please call our office to schedule the follow-up appointment.  

## 2012-08-16 DIAGNOSIS — R7989 Other specified abnormal findings of blood chemistry: Secondary | ICD-10-CM | POA: Diagnosis not present

## 2012-08-16 DIAGNOSIS — Z79899 Other long term (current) drug therapy: Secondary | ICD-10-CM | POA: Diagnosis not present

## 2012-09-22 DIAGNOSIS — E785 Hyperlipidemia, unspecified: Secondary | ICD-10-CM | POA: Diagnosis not present

## 2012-09-22 DIAGNOSIS — J209 Acute bronchitis, unspecified: Secondary | ICD-10-CM | POA: Diagnosis not present

## 2012-09-22 DIAGNOSIS — I10 Essential (primary) hypertension: Secondary | ICD-10-CM | POA: Diagnosis not present

## 2012-09-22 DIAGNOSIS — Z79899 Other long term (current) drug therapy: Secondary | ICD-10-CM | POA: Diagnosis not present

## 2012-09-22 DIAGNOSIS — F411 Generalized anxiety disorder: Secondary | ICD-10-CM | POA: Diagnosis not present

## 2012-09-22 DIAGNOSIS — R062 Wheezing: Secondary | ICD-10-CM | POA: Diagnosis not present

## 2012-11-04 DIAGNOSIS — M62838 Other muscle spasm: Secondary | ICD-10-CM | POA: Diagnosis not present

## 2012-11-04 DIAGNOSIS — Z79899 Other long term (current) drug therapy: Secondary | ICD-10-CM | POA: Diagnosis not present

## 2012-11-04 DIAGNOSIS — I10 Essential (primary) hypertension: Secondary | ICD-10-CM | POA: Diagnosis not present

## 2012-11-04 DIAGNOSIS — E785 Hyperlipidemia, unspecified: Secondary | ICD-10-CM | POA: Diagnosis not present

## 2012-11-04 DIAGNOSIS — H6980 Other specified disorders of Eustachian tube, unspecified ear: Secondary | ICD-10-CM | POA: Diagnosis not present

## 2012-11-04 DIAGNOSIS — K219 Gastro-esophageal reflux disease without esophagitis: Secondary | ICD-10-CM | POA: Diagnosis not present

## 2013-01-31 ENCOUNTER — Ambulatory Visit (INDEPENDENT_AMBULATORY_CARE_PROVIDER_SITE_OTHER): Payer: Medicare Other | Admitting: Cardiovascular Disease

## 2013-01-31 ENCOUNTER — Encounter: Payer: Self-pay | Admitting: Cardiovascular Disease

## 2013-01-31 DIAGNOSIS — I10 Essential (primary) hypertension: Secondary | ICD-10-CM | POA: Diagnosis not present

## 2013-01-31 DIAGNOSIS — E785 Hyperlipidemia, unspecified: Secondary | ICD-10-CM

## 2013-01-31 DIAGNOSIS — I493 Ventricular premature depolarization: Secondary | ICD-10-CM

## 2013-01-31 DIAGNOSIS — I4949 Other premature depolarization: Secondary | ICD-10-CM | POA: Diagnosis not present

## 2013-01-31 MED ORDER — ATENOLOL 25 MG PO TABS: 25.0000 mg | ORAL_TABLET | Freq: Every day | ORAL | Status: DC

## 2013-01-31 NOTE — Assessment & Plan Note (Signed)
Cholesterol is at goal.  Continue current dose of statin and diet Rx.  No myalgias or side effects.  F/U  LFT's in 6 months. No results found for this basename: LDLCALC  Labs with primary            

## 2013-01-31 NOTE — Assessment & Plan Note (Signed)
Benign SEM  Consider f/u echo in future

## 2013-01-31 NOTE — Assessment & Plan Note (Signed)
Resolved continue low dose beta blocker Try Atenolol which will be cheaper than bystolic

## 2013-01-31 NOTE — Progress Notes (Signed)
Patient ID: Alicia Rodgers, female   DOB: November 04, 1945, 66 y.o.   MRN: [id number] Ms. Mcclinton returns today for followup. She has a history of hypertension and is much better controlled on Micardis; however, this medicine is expensive for her and we need to come up with a substitute. Lisinopril/HCTZ was not effective. Currently on cozaar and diuretic Bystolic too expensive  Coronary risk factors include hypertension and hyperlipidemia. She has had no documented coronary artery disease. She has been compliant with her meds. Under a lot of stress Expenses at home. Thinking of retiring Wants to be close to grand babies at [facility]. Had lab work a month ago with primary in [place] Anemic Colonoscopy next month.  Still some melancholy daysDaughter married in April and she may move to [place] after wedding. Her twin grandsons are now 18  [place] and [name] living in [place] now   ROS: Denies fever, malais, weight loss, blurry vision, decreased visual acuity, cough, sputum, SOB, hemoptysis, pleuritic pain, palpitaitons, heartburn, abdominal pain, melena, lower extremity edema, claudication, or rash.  All other systems reviewed and negative  General: Affect appropriate Healthy:  appears stated age HEENT: normal Neck supple with no adenopathy JVP normal no bruits no thyromegaly Lungs clear with no wheezing and good diaphragmatic motion Heart:  S1/S2 SEM  murmur, no rub, gallop or click PMI normal Abdomen: benighn, BS positve, no tenderness, no AAA no bruit.  No HSM or HJR Distal pulses intact with no bruits No edema Neuro non-focal Skin warm and dry No muscular weakness   Current Outpatient Prescriptions  Medication Sig Dispense Refill  . ALPRAZolam (XANAX) 0.5 MG tablet Take 0.5 mg by mouth at bedtime as needed.      Marland Kitchen aspirin 81 MG tablet Take 81 mg by mouth daily.      . Cholecalciferol 1000 UNITS capsule Take 1,000 Units by mouth daily.      . cyanocobalamin 500 MCG tablet Take 1,000 mcg by mouth  daily.      Marland Kitchen escitalopram (LEXAPRO) 5 MG tablet Take 5 mg by mouth daily.      . hydrochlorothiazide (HYDRODIURIL) 12.5 MG tablet Take 12.5 mg by mouth daily.      Marland Kitchen losartan (COZAAR) 100 MG tablet Take 100 mg by mouth daily.      . nebivolol (BYSTOLIC) 10 MG tablet Take 10 mg by mouth daily.      . pravastatin (PRAVACHOL) 10 MG tablet Take 10 mg by mouth every other day.        No current facility-administered medications for this visit.    Allergies  Penicillins  Electrocardiogram:  SR rate 64  Nonspecific ST/T wave changes   Assessment and Plan

## 2013-01-31 NOTE — Assessment & Plan Note (Addendum)
Well controlled.  Continue current medications and low sodium Dash type diet.  Script for atenolol 25 mg called in to [facility] pharmacy

## 2013-01-31 NOTE — Patient Instructions (Addendum)
Your physician wants you to follow-up in:   6 MONTHS WITH DR Haywood Filler will receive a reminder letter in the mail two months in advance. If you don't receive a letter, please call our office to schedule the follow-up appointment.. Your physician has recommended you make the following change in your medication: STOP  BYSTOLIC AND START  ATENOLOL 25 MG  EVERY DAY

## 2013-03-03 DIAGNOSIS — I10 Essential (primary) hypertension: Secondary | ICD-10-CM | POA: Diagnosis not present

## 2013-03-03 DIAGNOSIS — E785 Hyperlipidemia, unspecified: Secondary | ICD-10-CM | POA: Diagnosis not present

## 2013-03-03 DIAGNOSIS — E559 Vitamin D deficiency, unspecified: Secondary | ICD-10-CM | POA: Diagnosis not present

## 2013-03-03 DIAGNOSIS — Z79899 Other long term (current) drug therapy: Secondary | ICD-10-CM | POA: Diagnosis not present

## 2013-03-03 DIAGNOSIS — F411 Generalized anxiety disorder: Secondary | ICD-10-CM | POA: Diagnosis not present

## 2013-03-03 DIAGNOSIS — D649 Anemia, unspecified: Secondary | ICD-10-CM | POA: Diagnosis not present

## 2013-07-03 DIAGNOSIS — M62838 Other muscle spasm: Secondary | ICD-10-CM | POA: Diagnosis not present

## 2013-07-03 DIAGNOSIS — I10 Essential (primary) hypertension: Secondary | ICD-10-CM | POA: Diagnosis not present

## 2013-07-03 DIAGNOSIS — E785 Hyperlipidemia, unspecified: Secondary | ICD-10-CM | POA: Diagnosis not present

## 2013-07-03 DIAGNOSIS — F411 Generalized anxiety disorder: Secondary | ICD-10-CM | POA: Diagnosis not present

## 2013-07-03 DIAGNOSIS — Z79899 Other long term (current) drug therapy: Secondary | ICD-10-CM | POA: Diagnosis not present

## 2013-07-28 ENCOUNTER — Encounter: Payer: Self-pay | Admitting: Cardiovascular Disease

## 2013-07-28 ENCOUNTER — Ambulatory Visit (INDEPENDENT_AMBULATORY_CARE_PROVIDER_SITE_OTHER): Payer: Medicare Other | Admitting: Cardiovascular Disease

## 2013-07-28 VITALS — BP 138/82 | HR 64 | Ht 62.0 in | Wt 157.4 lb

## 2013-07-28 DIAGNOSIS — I493 Ventricular premature depolarization: Secondary | ICD-10-CM

## 2013-07-28 DIAGNOSIS — I4949 Other premature depolarization: Secondary | ICD-10-CM

## 2013-07-28 DIAGNOSIS — I10 Essential (primary) hypertension: Secondary | ICD-10-CM | POA: Diagnosis not present

## 2013-07-28 DIAGNOSIS — E785 Hyperlipidemia, unspecified: Secondary | ICD-10-CM | POA: Diagnosis not present

## 2013-07-28 NOTE — Assessment & Plan Note (Signed)
Well controlled.  Continue current medications and low sodium Dash type diet.    

## 2013-07-28 NOTE — Assessment & Plan Note (Signed)
Cholesterol is at goal.  Continue current dose of statin and diet Rx.  No myalgias or side effects.  F/U  LFT's in 6 months. No results found for this basename: [facility]  Labs with Dr Gari Crown in [place]

## 2013-07-28 NOTE — Progress Notes (Signed)
Patient ID: Alicia Rodgers, female   DOB: Feb 23, 1946, 67 y.o.   MRN: [id number] Alicia Rodgers returns today for followup. She has a history of hypertension and is much better controlled on Micardis; however, this medicine is expensive for her and we need to come up with a substitute. Lisinopril/HCTZ was not effective. Currently on cozaar and diuretic Bystolic too expensive Coronary risk factors include hypertension and hyperlipidemia. She has had no documented coronary artery disease. She has been compliant with her meds. Under a lot of stress Expenses at home. Thinking of retiring Wants to be close to grand babies at [facility]. Had lab work a month ago with primary in [place] Anemic Colonoscopy next month.  Still some melancholy days Daughter married in April and she may move to [place] after wedding. 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  All other systems reviewed and negative  General: Affect appropriate Healthy:  appears stated age HEENT: normal Neck supple with no adenopathy JVP normal no bruits no thyromegaly Lungs clear with no wheezing and good diaphragmatic motion Heart:  S1/S2 no murmur, no rub, gallop or click PMI normal Abdomen: benighn, BS positve, no tenderness, no AAA no bruit.  No HSM or HJR Distal pulses intact with no bruits No edema Neuro non-focal Skin warm and dry No muscular weakness   Current Outpatient Prescriptions  Medication Sig Dispense Refill  . ALPRAZolam (XANAX) 0.5 MG tablet Take 0.5 mg by mouth at bedtime as needed.      Marland Kitchen amLODipine (NORVASC) 2.5 MG tablet Take 2.5 mg by mouth daily.      Marland Kitchen aspirin 81 MG tablet Take 81  mg by mouth daily.      Marland Kitchen atenolol (TENORMIN) 25 MG tablet Take 1 tablet (25 mg total) by mouth daily.  30 tablet  11  . Cholecalciferol 1000 UNITS capsule Take 1,000 Units by mouth daily.      . cyanocobalamin 500 MCG tablet Take 1,000 mcg by mouth daily.      Marland Kitchen escitalopram (LEXAPRO) 5 MG tablet Take 5 mg by mouth daily.      . hydrochlorothiazide (HYDRODIURIL) 12.5 MG tablet Take 12.5 mg by mouth daily.      Marland Kitchen losartan (COZAAR) 100 MG tablet Take 100 mg by mouth daily.      . pravastatin (PRAVACHOL) 10 MG tablet Take 10 mg by mouth every other day.        No current facility-administered medications for this visit.    Allergies  Penicillins  Electrocardiogram: NSR normal ECG  6/17  Rate 64   Assessment and Plan

## 2013-07-28 NOTE — Assessment & Plan Note (Signed)
Resolved Normal EF no CAD Observe

## 2013-07-28 NOTE — Patient Instructions (Addendum)
Your physician wants you to follow-up in:   62 MONTH  WITH  DR Haywood Filler will receive a reminder letter in the mail two months in advance. If you don't receive a letter, please call our office to schedule the follow-up appointment. Your physician recommends that you continue on your current medications as directed. Please refer to the Current Medication list given to you today.

## 2013-07-31 ENCOUNTER — Ambulatory Visit: Payer: Medicare Other | Admitting: Cardiovascular Disease

## 2013-10-31 DIAGNOSIS — F411 Generalized anxiety disorder: Secondary | ICD-10-CM | POA: Diagnosis not present

## 2013-10-31 DIAGNOSIS — E785 Hyperlipidemia, unspecified: Secondary | ICD-10-CM | POA: Diagnosis not present

## 2013-10-31 DIAGNOSIS — J209 Acute bronchitis, unspecified: Secondary | ICD-10-CM | POA: Diagnosis not present

## 2013-10-31 DIAGNOSIS — I10 Essential (primary) hypertension: Secondary | ICD-10-CM | POA: Diagnosis not present

## 2013-10-31 DIAGNOSIS — Z79899 Other long term (current) drug therapy: Secondary | ICD-10-CM | POA: Diagnosis not present

## 2013-10-31 DIAGNOSIS — J019 Acute sinusitis, unspecified: Secondary | ICD-10-CM | POA: Diagnosis not present

## 2013-12-12 DIAGNOSIS — Z1231 Encounter for screening mammogram for malignant neoplasm of breast: Secondary | ICD-10-CM | POA: Diagnosis not present

## 2014-01-13 DIAGNOSIS — K5229 Other allergic and dietetic gastroenteritis and colitis: Secondary | ICD-10-CM | POA: Diagnosis not present

## 2014-02-08 ENCOUNTER — Other Ambulatory Visit: Payer: Self-pay | Admitting: Cardiovascular Disease

## 2014-03-02 DIAGNOSIS — I10 Essential (primary) hypertension: Secondary | ICD-10-CM | POA: Diagnosis not present

## 2014-03-02 DIAGNOSIS — G4700 Insomnia, unspecified: Secondary | ICD-10-CM | POA: Diagnosis not present

## 2014-03-02 DIAGNOSIS — Z79899 Other long term (current) drug therapy: Secondary | ICD-10-CM | POA: Diagnosis not present

## 2014-03-02 DIAGNOSIS — E785 Hyperlipidemia, unspecified: Secondary | ICD-10-CM | POA: Diagnosis not present

## 2014-03-02 DIAGNOSIS — F411 Generalized anxiety disorder: Secondary | ICD-10-CM | POA: Diagnosis not present

## 2014-04-09 DIAGNOSIS — N390 Urinary tract infection, site not specified: Secondary | ICD-10-CM | POA: Diagnosis not present

## 2014-05-03 DIAGNOSIS — R7989 Other specified abnormal findings of blood chemistry: Secondary | ICD-10-CM | POA: Diagnosis not present

## 2014-07-31 ENCOUNTER — Ambulatory Visit (INDEPENDENT_AMBULATORY_CARE_PROVIDER_SITE_OTHER): Payer: Medicare Other | Admitting: Cardiovascular Disease

## 2014-07-31 VITALS — BP 125/67 | HR 70

## 2014-07-31 DIAGNOSIS — I493 Ventricular premature depolarization: Secondary | ICD-10-CM

## 2014-07-31 DIAGNOSIS — I10 Essential (primary) hypertension: Secondary | ICD-10-CM

## 2014-07-31 DIAGNOSIS — H2511 Age-related nuclear cataract, right eye: Secondary | ICD-10-CM | POA: Diagnosis not present

## 2014-07-31 DIAGNOSIS — H02839 Dermatochalasis of unspecified eye, unspecified eyelid: Secondary | ICD-10-CM | POA: Diagnosis not present

## 2014-07-31 DIAGNOSIS — E785 Hyperlipidemia, unspecified: Secondary | ICD-10-CM

## 2014-07-31 DIAGNOSIS — H18411 Arcus senilis, right eye: Secondary | ICD-10-CM | POA: Diagnosis not present

## 2014-07-31 MED ORDER — ATENOLOL 25 MG PO TABS: 25.0000 mg | ORAL_TABLET | Freq: Every day | ORAL | Status: DC

## 2014-07-31 NOTE — Assessment & Plan Note (Signed)
F/U labs with primary in [place] next month

## 2014-07-31 NOTE — Assessment & Plan Note (Signed)
Resolved no palpitations improved

## 2014-07-31 NOTE — Assessment & Plan Note (Signed)
Well controlled.  Continue current medications and low sodium Dash type diet.    

## 2014-07-31 NOTE — Progress Notes (Signed)
Patient ID: Alicia Rodgers, female   DOB: 1946-05-24, 68 y.o.   MRN: [id number] Alicia Rodgers returns today for followup. She has a history of hypertension and is much better controlled on Micardis; however, this medicine is expensive for her and we need to come up with a substitute. Lisinopril/HCTZ was not effective. Currently on cozaar and diuretic Bystolic too expensive Coronary risk factors include hypertension and hyperlipidemia. She has had no documented coronary artery disease. She has been compliant with her meds. Under a lot of stress Expenses at home. Thinking of retiring Wants to be close to grand babies at [facility]. Had lab work a month ago with primary in [place] Anemic Colonoscopy next month.  Still some melancholy days Daughter married in April and she may move to [place] after wedding. 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  Living in [place] now and will stay at home With 3 children after birth   ROS: Denies fever, malais, weight loss, blurry vision, decreased visual acuity, cough, sputum, SOB, hemoptysis, pleuritic pain, palpitaitons, heartburn, abdominal pain, melena, lower extremity edema, claudication, or rash.  All other systems reviewed and negative  General: Affect appropriate Healthy:  appears stated age 68: normal Neck supple with no adenopathy JVP normal no bruits no thyromegaly Lungs clear with no wheezing and good diaphragmatic motion Heart:  S1/S2 SEM murmur, no rub, gallop or click PMI normal Abdomen: benighn, BS positve, no tenderness, no AAA no bruit.  No HSM or HJR Distal pulses intact with no bruits No edema Neuro non-focal Skin warm and dry No muscular weakness   Current Outpatient Prescriptions  Medication Sig Dispense Refill  .  ALPRAZolam (XANAX) 0.5 MG tablet Take 0.5 mg by mouth at bedtime as needed.    Marland Kitchen amLODipine (NORVASC) 2.5 MG tablet Take 2.5 mg by mouth daily.    Marland Kitchen aspirin 81 MG tablet Take 81 mg by mouth daily.    Marland Kitchen atenolol (TENORMIN) 25 MG tablet TAKE 1 TABLET ONCE DAILY. 30 tablet 5  . Cholecalciferol 1000 UNITS capsule Take 1,000 Units by mouth daily.    . cyanocobalamin 500 MCG tablet Take 1,000 mcg by mouth daily.    Marland Kitchen escitalopram (LEXAPRO) 5 MG tablet Take 5 mg by mouth daily.    . hydrochlorothiazide (HYDRODIURIL) 12.5 MG tablet Take 12.5 mg by mouth daily.    Marland Kitchen losartan (COZAAR) 100 MG tablet Take 100 mg by mouth daily.    . pravastatin (PRAVACHOL) 10 MG tablet Take 10 mg by mouth every other day.      No current facility-administered medications for this visit.    Allergies  Penicillins  Electrocardiogram:  6/14  SR rate 64  Nonspecific ST/T wave changes  Assessment and Plan

## 2014-07-31 NOTE — Patient Instructions (Signed)
Your physician wants you to follow-up in: YEAR WITH DR NISHAN  You will receive a reminder letter in the mail two months in advance. If you don't receive a letter, please call our office to schedule the follow-up appointment.  Your physician recommends that you continue on your current medications as directed. Please refer to the Current Medication list given to you today. 

## 2014-08-03 DIAGNOSIS — G4700 Insomnia, unspecified: Secondary | ICD-10-CM | POA: Diagnosis not present

## 2014-08-03 DIAGNOSIS — Z79899 Other long term (current) drug therapy: Secondary | ICD-10-CM | POA: Diagnosis not present

## 2014-08-03 DIAGNOSIS — F419 Anxiety disorder, unspecified: Secondary | ICD-10-CM | POA: Diagnosis not present

## 2014-08-03 DIAGNOSIS — K219 Gastro-esophageal reflux disease without esophagitis: Secondary | ICD-10-CM | POA: Diagnosis not present

## 2014-08-03 DIAGNOSIS — I10 Essential (primary) hypertension: Secondary | ICD-10-CM | POA: Diagnosis not present

## 2014-08-03 DIAGNOSIS — E785 Hyperlipidemia, unspecified: Secondary | ICD-10-CM | POA: Diagnosis not present

## 2014-09-12 DIAGNOSIS — M109 Gout, unspecified: Secondary | ICD-10-CM | POA: Diagnosis not present

## 2014-09-24 DIAGNOSIS — H25811 Combined forms of age-related cataract, right eye: Secondary | ICD-10-CM | POA: Diagnosis not present

## 2014-09-24 DIAGNOSIS — H2511 Age-related nuclear cataract, right eye: Secondary | ICD-10-CM | POA: Diagnosis not present

## 2014-09-25 DIAGNOSIS — H2512 Age-related nuclear cataract, left eye: Secondary | ICD-10-CM | POA: Diagnosis not present

## 2014-10-15 DIAGNOSIS — H25812 Combined forms of age-related cataract, left eye: Secondary | ICD-10-CM | POA: Diagnosis not present

## 2014-10-15 DIAGNOSIS — H2512 Age-related nuclear cataract, left eye: Secondary | ICD-10-CM | POA: Diagnosis not present

## 2014-12-07 DIAGNOSIS — Z79899 Other long term (current) drug therapy: Secondary | ICD-10-CM | POA: Diagnosis not present

## 2014-12-07 DIAGNOSIS — G4700 Insomnia, unspecified: Secondary | ICD-10-CM | POA: Diagnosis not present

## 2014-12-07 DIAGNOSIS — F419 Anxiety disorder, unspecified: Secondary | ICD-10-CM | POA: Diagnosis not present

## 2014-12-07 DIAGNOSIS — M2550 Pain in unspecified joint: Secondary | ICD-10-CM | POA: Diagnosis not present

## 2014-12-07 DIAGNOSIS — I10 Essential (primary) hypertension: Secondary | ICD-10-CM | POA: Diagnosis not present

## 2014-12-07 DIAGNOSIS — E785 Hyperlipidemia, unspecified: Secondary | ICD-10-CM | POA: Diagnosis not present

## 2014-12-07 DIAGNOSIS — R252 Cramp and spasm: Secondary | ICD-10-CM | POA: Diagnosis not present

## 2014-12-31 DIAGNOSIS — Z1231 Encounter for screening mammogram for malignant neoplasm of breast: Secondary | ICD-10-CM | POA: Diagnosis not present

## 2015-05-28 DIAGNOSIS — Z961 Presence of intraocular lens: Secondary | ICD-10-CM | POA: Diagnosis not present

## 2015-05-28 DIAGNOSIS — H18411 Arcus senilis, right eye: Secondary | ICD-10-CM | POA: Diagnosis not present

## 2015-05-28 DIAGNOSIS — H02839 Dermatochalasis of unspecified eye, unspecified eyelid: Secondary | ICD-10-CM | POA: Diagnosis not present

## 2015-05-28 DIAGNOSIS — I10 Essential (primary) hypertension: Secondary | ICD-10-CM | POA: Diagnosis not present

## 2015-06-14 DIAGNOSIS — R209 Unspecified disturbances of skin sensation: Secondary | ICD-10-CM | POA: Diagnosis not present

## 2015-06-14 DIAGNOSIS — Z79899 Other long term (current) drug therapy: Secondary | ICD-10-CM | POA: Diagnosis not present

## 2015-06-14 DIAGNOSIS — I10 Essential (primary) hypertension: Secondary | ICD-10-CM | POA: Diagnosis not present

## 2015-06-14 DIAGNOSIS — E785 Hyperlipidemia, unspecified: Secondary | ICD-10-CM | POA: Diagnosis not present

## 2015-06-14 DIAGNOSIS — F419 Anxiety disorder, unspecified: Secondary | ICD-10-CM | POA: Diagnosis not present

## 2015-07-08 ENCOUNTER — Encounter: Payer: Self-pay | Admitting: Cardiovascular Disease

## 2015-08-06 ENCOUNTER — Ambulatory Visit: Payer: Medicare Other | Admitting: Cardiovascular Disease

## 2015-08-13 ENCOUNTER — Encounter: Payer: Medicare Other | Admitting: Cardiology

## 2015-08-13 NOTE — Progress Notes (Signed)
This encounter was created in error - please disregard.

## 2015-08-27 DIAGNOSIS — J189 Pneumonia, unspecified organism: Secondary | ICD-10-CM | POA: Diagnosis not present

## 2015-09-02 ENCOUNTER — Other Ambulatory Visit: Payer: Self-pay | Admitting: Cardiovascular Disease

## 2015-09-03 DIAGNOSIS — J189 Pneumonia, unspecified organism: Secondary | ICD-10-CM | POA: Diagnosis not present

## 2015-09-10 ENCOUNTER — Encounter: Payer: Self-pay | Admitting: Cardiovascular Disease

## 2015-09-19 NOTE — Progress Notes (Signed)
Patient ID: Alicia Rodgers, female   DOB: Jan 31, 1946, 70 y.o.   MRN: [id number]   Alicia Rodgers returns today for followup. She has a history of hypertension and is much better controlled on Micardis; however, this medicine is expensive for her and we need to come up with a substitute. Lisinopril/HCTZ was not effective. Currently on cozaar and diuretic Bystolic too expensive Coronary risk factors include hypertension and hyperlipidemia. She has had no documented coronary artery disease. She has been compliant with her meds. Under a lot of stress Expenses at home. Thinking of retiring Wants to be close to grand babies at [facility]. Had lab work a month ago with primary in [place] Anemic Colonoscopy next month.  Still some melancholy days Daughter married in April and she may move to [place] after wedding. 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  Living in [place] now and will stay at home With 3 children after birth   ROS: Denies fever, malais, weight loss, blurry vision, decreased visual acuity, cough, sputum, SOB, hemoptysis, pleuritic pain, palpitaitons, heartburn, abdominal pain, melena, lower extremity edema, claudication, or rash.  All other systems reviewed and negative  General: Affect appropriate Healthy:  appears stated age 70: normal Neck supple with no adenopathy JVP normal no bruits no thyromegaly Lungs clear with no wheezing and good diaphragmatic motion Heart:  S1/S2 SEM murmur, no rub, gallop or click PMI normal Abdomen: benighn, BS positve, no tenderness, no AAA no bruit.  No HSM or HJR Distal pulses intact with no bruits No edema Neuro non-focal Skin warm and dry No muscular weakness   Current Outpatient Prescriptions  Medication Sig Dispense Refill  .  ALPRAZolam (XANAX) 0.5 MG tablet Take 0.5 mg by mouth at bedtime as needed for anxiety.    Marland Kitchen amLODipine (NORVASC) 2.5 MG tablet Take 2.5 mg by mouth daily.    Marland Kitchen aspirin 81 MG tablet Take 81 mg by mouth daily.    Marland Kitchen atenolol (TENORMIN) 25 MG tablet Take 1 tablet (25 mg total) by mouth daily. 90 tablet 3  . Cholecalciferol 1000 UNITS capsule Take 1,000 Units by mouth daily.    Marland Kitchen escitalopram (LEXAPRO) 5 MG tablet Take 5 mg by mouth daily.    Marland Kitchen esomeprazole (NEXIUM) 20 MG capsule Take 20 mg by mouth daily as needed (heartburn).    . hydrochlorothiazide (HYDRODIURIL) 12.5 MG tablet Take 12.5 mg by mouth daily.    Marland Kitchen losartan (COZAAR) 100 MG tablet Take 100 mg by mouth daily.    . Multiple Vitamin (MULTIVITAMIN WITH MINERALS) TABS tablet Take 1 tablet by mouth daily.    . pravastatin (PRAVACHOL) 10 MG tablet Take 10 mg by mouth daily.    . vitamin B-12 (CYANOCOBALAMIN) 500 MCG tablet Take 500 mcg by mouth daily.     No current facility-administered medications for this visit.    Allergies  Penicillins  Electrocardiogram:  6/14  SR rate 64  Nonspecific ST/T wave changes  09/20/15  SR rate 60  Nonspecific ST changes   Assessment and Plan HTN: Well controlled.  Continue current medications and low sodium Dash type diet.   Abnormal ECG: nonspecific ST changes stable  Chol: labs with Dr Alicia Rodgers in [place] continue statin   GERD:  Alicia Rodgers

## 2015-09-20 ENCOUNTER — Ambulatory Visit (INDEPENDENT_AMBULATORY_CARE_PROVIDER_SITE_OTHER): Payer: Medicare Other | Admitting: Cardiovascular Disease

## 2015-09-20 ENCOUNTER — Encounter: Payer: Self-pay | Admitting: Cardiovascular Disease

## 2015-09-20 VITALS — BP 170/70 | HR 60 | Ht 63.0 in | Wt 148.4 lb

## 2015-09-20 DIAGNOSIS — I10 Essential (primary) hypertension: Secondary | ICD-10-CM | POA: Diagnosis not present

## 2015-09-20 MED ORDER — ATENOLOL 25 MG PO TABS: 25.0000 mg | ORAL_TABLET | Freq: Every day | ORAL | Status: DC

## 2015-09-20 NOTE — Patient Instructions (Signed)

## 2015-09-25 ENCOUNTER — Telehealth: Payer: Self-pay | Admitting: Internal Medicine

## 2015-09-25 NOTE — Telephone Encounter (Signed)
I called pt and she isn't moving till mid April and she is going to have one more appt with her provider in April. She said she will call us to schedule when she is ready.

## 2015-10-01 DIAGNOSIS — J189 Pneumonia, unspecified organism: Secondary | ICD-10-CM | POA: Diagnosis not present

## 2015-12-05 DIAGNOSIS — I10 Essential (primary) hypertension: Secondary | ICD-10-CM | POA: Diagnosis not present

## 2015-12-05 DIAGNOSIS — E785 Hyperlipidemia, unspecified: Secondary | ICD-10-CM | POA: Diagnosis not present

## 2015-12-05 DIAGNOSIS — Z1231 Encounter for screening mammogram for malignant neoplasm of breast: Secondary | ICD-10-CM | POA: Diagnosis not present

## 2015-12-05 DIAGNOSIS — G4700 Insomnia, unspecified: Secondary | ICD-10-CM | POA: Diagnosis not present

## 2015-12-05 DIAGNOSIS — E538 Deficiency of other specified B group vitamins: Secondary | ICD-10-CM | POA: Diagnosis not present

## 2015-12-05 DIAGNOSIS — Z79899 Other long term (current) drug therapy: Secondary | ICD-10-CM | POA: Diagnosis not present

## 2015-12-30 DIAGNOSIS — J40 Bronchitis, not specified as acute or chronic: Secondary | ICD-10-CM | POA: Diagnosis not present

## 2015-12-30 DIAGNOSIS — S30860A Insect bite (nonvenomous) of lower back and pelvis, initial encounter: Secondary | ICD-10-CM | POA: Diagnosis not present

## 2015-12-30 DIAGNOSIS — R209 Unspecified disturbances of skin sensation: Secondary | ICD-10-CM | POA: Diagnosis not present

## 2015-12-30 DIAGNOSIS — W57XXXA Bitten or stung by nonvenomous insect and other nonvenomous arthropods, initial encounter: Secondary | ICD-10-CM | POA: Diagnosis not present

## 2015-12-30 DIAGNOSIS — R05 Cough: Secondary | ICD-10-CM | POA: Diagnosis not present

## 2016-01-21 ENCOUNTER — Encounter: Payer: Self-pay | Admitting: Neurology

## 2016-01-21 ENCOUNTER — Ambulatory Visit (INDEPENDENT_AMBULATORY_CARE_PROVIDER_SITE_OTHER): Payer: Medicare Other | Admitting: Neurology

## 2016-01-21 VITALS — BP 142/67 | HR 63 | Ht 63.0 in | Wt 143.0 lb

## 2016-01-21 DIAGNOSIS — R202 Paresthesia of skin: Secondary | ICD-10-CM | POA: Diagnosis not present

## 2016-01-21 NOTE — Progress Notes (Signed)
PATIENT: Alicia Rodgers DOB: 18-Nov-1945  Chief Complaint  Patient presents with  . Peripheral Neuropathy    Reports pain, burning and tingling in her bilateral feet for the last two months.  Symptoms tend to worsen after being on her feet all day and during the night.       HISTORICAL  Alicia Rodgers is a 70 years old right-handed female, seen in refer by her primary care doctor Ernestene Kiel for evaluation of bilateral feet paresthesia  I reviewed and summarized the most recent referring note, she had a history of anxiety, hypertension, hyperlipidemia, chronic insomnia, had a history of cervical cancer, had hysterectomy in 1989  Around 2015, she noticed intermittent bilateral feet paresthesia, involving bilateral toes, ball of bilateral plantar feet, over the past 2 years, become more obvious, especially after weightbearing, she felt burning tingly sensation at bilateral plantar feet, coldness at bilateral toes, she denies gait difficulty, no low back pain, no bilateral fingertips paresthesia, no bowel and bladder incontinence.   Occasionally her bilateral feet paresthesia is so bothersome, she has difficulty sleeping, urge to move her leg, has to put socks on her feet to warm it up.  I reviewed most recent lab evaluation in May 2017, Lyme titer, Rocky mountain spotted fever was negative after tick bite.  She reported laboratory evaluation in April 2017, no significant abnormality   REVIEW OF SYSTEMS: Full 14 system review of systems performed and notable only for Anxiety, not enough sleep, allergy   ALLERGIES: Allergies  Allergen Reactions  . Penicillins     Rash & swelling     HOME MEDICATIONS: Current Outpatient Prescriptions  Medication Sig Dispense Refill  . ALPRAZolam (XANAX) 0.5 MG tablet Take 0.5 mg by mouth at bedtime as needed for anxiety.    Marland Kitchen amLODipine (NORVASC) 2.5 MG tablet Take 2.5 mg by mouth daily.    Marland Kitchen aspirin 81 MG tablet Take 81 mg by mouth daily.      Marland Kitchen atenolol (TENORMIN) 25 MG tablet Take 1 tablet (25 mg total) by mouth daily. 90 tablet 3  . Cholecalciferol 1000 UNITS capsule Take 1,000 Units by mouth daily.    Marland Kitchen escitalopram (LEXAPRO) 5 MG tablet Take 5 mg by mouth daily.    Marland Kitchen esomeprazole (NEXIUM) 20 MG capsule Take 20 mg by mouth daily as needed (heartburn).    . hydrochlorothiazide (HYDRODIURIL) 12.5 MG tablet Take 12.5 mg by mouth daily.    Marland Kitchen losartan (COZAAR) 100 MG tablet Take 100 mg by mouth daily.    . Multiple Vitamin (MULTIVITAMIN WITH MINERALS) TABS tablet Take 1 tablet by mouth daily.    . pravastatin (PRAVACHOL) 10 MG tablet Take 10 mg by mouth daily.    . vitamin B-12 (CYANOCOBALAMIN) 500 MCG tablet Take 500 mcg by mouth daily.     No current facility-administered medications for this visit.    PAST MEDICAL HISTORY: Past Medical History  Diagnosis Date  . HYPERLIPIDEMIA   . HYPERTENSION   . CERVICAL CANCER, HX OF   . RHEUMATIC FEVER, HX OF   . Anemia   . Fibrocystic breast   . Peripheral neuropathy ([place])     PAST SURGICAL HISTORY: Past Surgical History  Procedure Laterality Date  . Total abdominal hysterectomy    . Breast cyst excision    . Tonsillectomy    . Cesarean section      x3  . Appendectomy      FAMILY HISTORY: Family History  Problem Relation Age of  Onset  . Heart disease Father   . Stroke Mother   . Breast cancer Paternal Aunt   . Thyroid cancer Paternal Aunt   . Stroke Paternal Grandfather   . Heart disease Maternal Grandfather   . Cancer Maternal Grandmother     Unsure    SOCIAL HISTORY:  Social History   Social History  . Marital Status: Married    Spouse Name: N/A  . Number of Children: 3  . Years of Education: 1 yr coll   Occupational History  . Accountant     Retired   Social History Main Topics  . Smoking status: Never Smoker   . Smokeless tobacco: Never Used  . Alcohol Use: No  . Drug Use: No  . Sexual Activity: Not on file   Other Topics Concern  . Not on  file   Social History Narrative   Lives with her daughter and her family.   Right-handed.   1 cup caffeine daily.     PHYSICAL EXAM   Filed Vitals:   01/21/16 1137  BP: 142/67  Pulse: 63  Height: 5\' 3"  (1.6 m)  Weight: 143 lb (64.864 kg)    Not recorded      Body mass index is 25.34 kg/(m^2).  PHYSICAL EXAMNIATION:  Gen: NAD, conversant, well nourised, obese, well groomed                     Cardiovascular: Regular rate rhythm, no peripheral edema, warm, nontender. Eyes: Conjunctivae clear without exudates or hemorrhage Neck: Supple, no carotid bruise. Pulmonary: Clear to auscultation bilaterally   NEUROLOGICAL EXAM:  MENTAL STATUS: Speech:    Speech is normal; fluent and spontaneous with normal comprehension.  Cognition:     Orientation to time, place and person     Normal recent and remote memory     Normal Attention span and concentration     Normal Language, naming, repeating,spontaneous speech     Fund of knowledge   CRANIAL NERVES: CN II: Visual fields are full to confrontation. Fundoscopic exam is normal with sharp discs and no vascular changes. Pupils are round equal and briskly reactive to light. CN III, IV, VI: extraocular movement are normal. No ptosis. CN V: Facial sensation is intact to pinprick in all 3 divisions bilaterally. Corneal responses are intact.  CN VII: Face is symmetric with normal eye closure and smile. CN VIII: Hearing is normal to rubbing fingers CN IX, X: Palate elevates symmetrically. Phonation is normal. CN XI: Head turning and shoulder shrug are intact CN XII: Tongue is midline with normal movements and no atrophy.  MOTOR: There is no pronator drift of out-stretched arms. Muscle bulk and tone are normal. Muscle strength is normal.  REFLEXES: Reflexes are 2+ and symmetric at the biceps, triceps, knees, and ankles. Plantar responses are flexor.  SENSORY: Intact to light touch, pinprick, positional sensation and vibratory  sensation are intact in fingers and toes.  COORDINATION: Rapid alternating movements and fine finger movements are intact. There is no dysmetria on finger-to-nose and heel-knee-shin.    GAIT/STANCE: Posture is normal. Gait is steady with normal steps, base, arm swing, and turning. Heel and toe walking are normal. Tandem gait is normal.  Romberg is absent.   DIAGNOSTIC DATA (LABS, IMAGING, TESTING) - I reviewed patient records, labs, notes, testing and imaging myself where available.   ASSESSMENT AND PLAN  Alicia Rodgers is a 70 y.o. female    Bilateral lower extremity paresthesia, most consistent with  small vessel disease  Start with EMG nerve conduction study Get laboratory evaluation from her primary care at [facility] Dr. Ernestene Kiel Ph  [phone] Fax: [phone]  May consider skin biopsy  Marcial Pacas, M.D. Ph.D.  [facility] [address], [place], [place] [address] Ph: [phone] Fax: [phone]  [id number] Laqueta Due, MD

## 2016-02-25 ENCOUNTER — Encounter: Payer: Medicare Other | Admitting: Neurology

## 2016-03-17 NOTE — Progress Notes (Signed)
Patient ID: Alicia Rodgers, female   DOB: 1945/12/18, 70 y.o.   MRN: [id number]   Ms. Monts returns today for followup. She has a history of hypertension and is much better controlled on Micardis; however, this medicine is expensive for her and we need to come up with a substitute. Lisinopril/HCTZ was not effective. Currently on cozaar and diuretic Bystolic too expensive Coronary risk factors include hypertension and hyperlipidemia. She has had no documented coronary artery disease. She has been compliant with her meds. Under a lot of stress Expenses at home. Thinking of retiring Wants to be close to grand babies at [facility]. Had lab work a month ago with primary in [place] Anemic Colonoscopy next month.  Still some melancholy days Daughter married in April and she may move to [place] after wedding. 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  Living in [place] now and will stay at home With 3 children after birth   ROS: Denies fever, malais, weight loss, blurry vision, decreased visual acuity, cough, sputum, SOB, hemoptysis, pleuritic pain, palpitaitons, heartburn, abdominal pain, melena, lower extremity edema, claudication, or rash.  All other systems reviewed and negative  General: Affect appropriate Healthy:  appears stated age 61: normal Neck supple with no adenopathy JVP normal no bruits no thyromegaly Lungs clear with no wheezing and good diaphragmatic motion Heart:  S1/S2 SEM murmur, no rub, gallop or click PMI normal Abdomen: benighn, BS positve, no tenderness, no AAA no bruit.  No HSM or HJR Distal pulses intact with no bruits No edema Neuro non-focal Skin warm and dry No muscular weakness   Current Outpatient Prescriptions  Medication Sig Dispense Refill  .  ALPRAZolam (XANAX) 0.5 MG tablet Take 0.5 mg by mouth at bedtime as needed for anxiety.    Marland Kitchen amLODipine (NORVASC) 2.5 MG tablet Take 2.5 mg by mouth daily.    Marland Kitchen aspirin 81 MG tablet Take 81 mg by mouth daily.    Marland Kitchen atenolol (TENORMIN) 25 MG tablet Take 1 tablet (25 mg total) by mouth daily. 90 tablet 3  . Cholecalciferol 1000 UNITS capsule Take 1,000 Units by mouth daily.    Marland Kitchen escitalopram (LEXAPRO) 5 MG tablet Take 5 mg by mouth daily.    Marland Kitchen esomeprazole (NEXIUM) 20 MG capsule Take 20 mg by mouth daily as needed (heartburn).    . hydrochlorothiazide (HYDRODIURIL) 12.5 MG tablet Take 12.5 mg by mouth daily.    Marland Kitchen losartan (COZAAR) 100 MG tablet Take 100 mg by mouth daily.    . pravastatin (PRAVACHOL) 10 MG tablet Take 10 mg by mouth daily.    . vitamin B-12 (CYANOCOBALAMIN) 500 MCG tablet Take 500 mcg by mouth daily.     No current facility-administered medications for this visit.     Allergies  Penicillins  Electrocardiogram:  6/14  SR rate 64  Nonspecific ST/T wave changes  09/20/15  SR rate 60  Nonspecific ST changes   Assessment and Plan HTN: Well controlled.  Continue current medications and low sodium Dash type diet.    Abnormal ECG: nonspecific ST changes stable   Chol: labs with Dr Willette Alma in [place] continue statin   GERD: discussed weight loss and low carb diet   Murmur: benign SEM has had rheumatic fever script for clindamycin given  for dental procedures coming up   [facility]

## 2016-03-18 ENCOUNTER — Encounter: Payer: Self-pay | Admitting: Cardiovascular Disease

## 2016-03-18 ENCOUNTER — Encounter (INDEPENDENT_AMBULATORY_CARE_PROVIDER_SITE_OTHER): Payer: Self-pay

## 2016-03-18 ENCOUNTER — Ambulatory Visit (INDEPENDENT_AMBULATORY_CARE_PROVIDER_SITE_OTHER): Payer: Medicare Other | Admitting: Cardiovascular Disease

## 2016-03-18 VITALS — BP 135/70 | HR 59 | Ht 63.0 in | Wt 146.4 lb

## 2016-03-18 DIAGNOSIS — I493 Ventricular premature depolarization: Secondary | ICD-10-CM

## 2016-03-18 NOTE — Patient Instructions (Signed)

## 2016-04-17 ENCOUNTER — Other Ambulatory Visit: Payer: Self-pay

## 2016-04-27 DIAGNOSIS — Z79899 Other long term (current) drug therapy: Secondary | ICD-10-CM | POA: Diagnosis not present

## 2016-04-27 DIAGNOSIS — I10 Essential (primary) hypertension: Secondary | ICD-10-CM | POA: Diagnosis not present

## 2016-04-27 DIAGNOSIS — E785 Hyperlipidemia, unspecified: Secondary | ICD-10-CM | POA: Diagnosis not present

## 2016-04-27 DIAGNOSIS — F419 Anxiety disorder, unspecified: Secondary | ICD-10-CM | POA: Diagnosis not present

## 2016-04-27 DIAGNOSIS — K219 Gastro-esophageal reflux disease without esophagitis: Secondary | ICD-10-CM | POA: Diagnosis not present

## 2016-05-06 DIAGNOSIS — J00 Acute nasopharyngitis [common cold]: Secondary | ICD-10-CM | POA: Diagnosis not present

## 2016-06-02 DIAGNOSIS — H02839 Dermatochalasis of unspecified eye, unspecified eyelid: Secondary | ICD-10-CM | POA: Diagnosis not present

## 2016-06-02 DIAGNOSIS — Z961 Presence of intraocular lens: Secondary | ICD-10-CM | POA: Diagnosis not present

## 2016-06-02 DIAGNOSIS — H18411 Arcus senilis, right eye: Secondary | ICD-10-CM | POA: Diagnosis not present

## 2016-06-02 DIAGNOSIS — I10 Essential (primary) hypertension: Secondary | ICD-10-CM | POA: Diagnosis not present

## 2016-06-03 DIAGNOSIS — Z23 Encounter for immunization: Secondary | ICD-10-CM | POA: Diagnosis not present

## 2016-06-30 ENCOUNTER — Telehealth: Payer: Self-pay | Admitting: Cardiovascular Disease

## 2016-06-30 NOTE — Telephone Encounter (Addendum)
Patient stated she has been having dizzy spells for about a week when changing positions. Patient took her BP when sitting and standing. 120/57 and 114/67. Patient is not sure what is going on and wants to be seen now. Informed patient that she could come in this week to see Dr. Johnsie Cancel to be evaluated. Patient agreed to plan and if her symptoms get worse she will call our office.

## 2016-06-30 NOTE — Telephone Encounter (Signed)
Alicia Rodgers is calling because she is having some dizziness when she gets out of the bed and a spell or two during the course of the day . Her blood pressure is fluctuating up and down . Wants to come in to see someone .Marland Kitchen Please call

## 2016-07-01 NOTE — Progress Notes (Signed)
Patient ID: Alicia Rodgers, female   DOB: 1946-03-17, 70 y.o.   MRN: [id number]   Alicia Rodgers returns today for followup. She has a history of hypertension and is much better controlled on Micardis; however, this medicine is expensive for her and we need to come up with a substitute. Lisinopril/HCTZ was not effective. Currently on cozaar and diuretic Bystolic too expensive Coronary risk factors include hypertension and hyperlipidemia. She has had no documented coronary artery disease. She has been compliant with her meds. Under a lot of stress Expenses at home. Thinking of retiring Wants to be close to grand babies at [place]. Had lab work a month ago with primary in [place] Anemic Colonoscopy next month.  Still some melancholy days Daughter married in April and she may move to [place] after wedding. 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  Living in [place] now and will stay at home With 3 children after birth  Having some postural dizzy spells this week. When she checks her BP ok though Sounds like am vertigo   ROS: Denies fever, malais, weight loss, blurry vision, decreased visual acuity, cough, sputum, SOB, hemoptysis, pleuritic pain, palpitaitons, heartburn, abdominal pain, melena, lower extremity edema, claudication, or rash.  All other systems reviewed and negative  General: Affect appropriate Healthy:  appears stated age 70: normal Neck supple with no adenopathy JVP normal no bruits no thyromegaly Lungs clear with no wheezing and good diaphragmatic motion Heart:  S1/S2 SEM murmur, no rub, gallop or click PMI normal Abdomen: benighn, BS positve, no tenderness, no AAA no bruit.  No HSM or HJR Distal pulses intact with no bruits No edema Neuro non-focal Skin warm and  dry No muscular weakness   Current Outpatient Prescriptions  Medication Sig Dispense Refill  . amLODipine (NORVASC) 2.5 MG tablet Take 2.5 mg by mouth daily.    Marland Kitchen aspirin 81 MG tablet Take 81 mg by mouth daily.    Marland Kitchen atenolol (TENORMIN) 25 MG tablet Take 1 tablet (25 mg total) by mouth daily. 90 tablet 3  . Cholecalciferol 1000 UNITS capsule Take 1,000 Units by mouth daily.    Marland Kitchen escitalopram (LEXAPRO) 5 MG tablet Take 5 mg by mouth daily.    Marland Kitchen esomeprazole (NEXIUM) 20 MG capsule Take 20 mg by mouth daily as needed (heartburn).    . hydrochlorothiazide (HYDRODIURIL) 12.5 MG tablet Take 12.5 mg by mouth daily.    Marland Kitchen losartan (COZAAR) 100 MG tablet Take 100 mg by mouth daily.    . pravastatin (PRAVACHOL) 10 MG tablet Take 10 mg by mouth daily.    . vitamin B-12 (CYANOCOBALAMIN) 500 MCG tablet Take 500 mcg by mouth daily.     No current facility-administered medications for this visit.     Allergies  Penicillins  Electrocardiogram:  6/14  SR rate 64  Nonspecific ST/T wave changes  09/20/15  SR rate 60  Nonspecific ST changes   Assessment and Plan HTN: Well controlled.  Continue current medications and low sodium Dash type diet.    Abnormal ECG: nonspecific ST changes stable   Chol: labs with Dr [place]ia Rodgers in [place] continue statin   GERD: discussed weight loss and low carb diet   Murmur: benign SEM has had rheumatic fever script for clindamycin given for  dental procedures coming up  Dizzy:  Mild vertigo antivert called in coached on slow position changes can refer to [facility] [place] for vestibular PT if worse  [name]

## 2016-07-02 ENCOUNTER — Ambulatory Visit (INDEPENDENT_AMBULATORY_CARE_PROVIDER_SITE_OTHER): Payer: Medicare Other | Admitting: Cardiovascular Disease

## 2016-07-02 ENCOUNTER — Encounter (INDEPENDENT_AMBULATORY_CARE_PROVIDER_SITE_OTHER): Payer: Self-pay

## 2016-07-02 ENCOUNTER — Encounter: Payer: Self-pay | Admitting: Cardiovascular Disease

## 2016-07-02 VITALS — BP 140/60 | HR 68 | Ht 63.0 in | Wt 147.4 lb

## 2016-07-02 DIAGNOSIS — R42 Dizziness and giddiness: Secondary | ICD-10-CM | POA: Diagnosis not present

## 2016-07-02 MED ORDER — MECLIZINE HCL 25 MG PO TABS: 25.0000 mg | ORAL_TABLET | Freq: Three times a day (TID) | ORAL | 0 refills | Status: DC | PRN

## 2016-07-02 NOTE — Patient Instructions (Signed)

## 2016-07-28 DIAGNOSIS — I10 Essential (primary) hypertension: Secondary | ICD-10-CM | POA: Diagnosis not present

## 2016-07-28 DIAGNOSIS — Z23 Encounter for immunization: Secondary | ICD-10-CM | POA: Diagnosis not present

## 2016-07-28 DIAGNOSIS — F418 Other specified anxiety disorders: Secondary | ICD-10-CM | POA: Diagnosis not present

## 2016-08-24 NOTE — Progress Notes (Signed)
Patient ID: Alicia Rodgers, female   DOB: [birth year], 71 y.o.   MRN: [id number]   [place] Rodgers returns today for followup. She has a history of hypertension and is much better controlled on Micardis; however, this medicine is expensive for her and we need to come up with a substitute. Lisinopril/HCTZ was not effective. Currently on cozaar and diuretic Bystolic too expensive Coronary risk factors include hypertension and hyperlipidemia. She has had no documented coronary artery disease. She has been compliant with her meds. Under a lot of stress Expenses at home. Thinking of retiring Wants to be close to grand babies at [facility]. Had lab work a month ago with primary in [place] Anemic Colonoscopy next month.  Still some melancholy days Daughter married in April and she may move to [place] [place] after wedding. 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  Living in [place] now and will stay at home With 3 children after birth  She was re united with her husbands children from a previous marriage Son and daughter living in [place] had a nice holiday with them   ROS: Denies fever, malais, weight loss, blurry vision, decreased visual acuity, cough, sputum, SOB, hemoptysis, pleuritic pain, palpitaitons, heartburn, abdominal pain, melena, lower extremity edema, claudication, or rash.  All other systems reviewed and negative  General: Affect appropriate Healthy:  appears stated age 71: normal Neck supple with no adenopathy JVP normal no bruits no thyromegaly Lungs clear with no wheezing and good diaphragmatic motion Heart:  S1/S2 SEM murmur, no rub, gallop or click PMI normal Abdomen: benighn, BS positve, no tenderness, no AAA no bruit.  No HSM or HJR Distal pulses intact with no bruits No  edema Neuro non-focal Skin warm and dry No muscular weakness   Current Outpatient Prescriptions  Medication Sig Dispense Refill  . amLODipine (NORVASC) 2.5 MG tablet Take 2.5 mg by mouth daily.    Marland Kitchen aspirin 81 MG tablet Take 81 mg by mouth daily.    Marland Kitchen atenolol (TENORMIN) 25 MG tablet Take 1 tablet (25 mg total) by mouth daily. 90 tablet 3  . Cholecalciferol 1000 UNITS capsule Take 1,000 Units by mouth daily.    Marland Kitchen esomeprazole (NEXIUM) 20 MG capsule Take 20 mg by mouth daily as needed (heartburn).    . hydrochlorothiazide (HYDRODIURIL) 12.5 MG tablet Take 12.5 mg by mouth daily.    Marland Kitchen losartan (COZAAR) 100 MG tablet Take 100 mg by mouth daily.    . meclizine (ANTIVERT) 25 MG tablet Take 1 tablet (25 mg total) by mouth 3 (three) times daily as needed for dizziness. 30 tablet 0  . pravastatin (PRAVACHOL) 10 MG tablet Take 10 mg by mouth daily.    . vitamin B-12 (CYANOCOBALAMIN) 500 MCG tablet Take 500 mcg by mouth daily.     No current facility-administered medications for this visit.     Allergies  Penicillins  Electrocardiogram:  6/14  SR rate 64  Nonspecific ST/T wave changes  09/20/15  SR rate 60  Nonspecific ST changes  08/27/16  SR rate 64 nonspecific ST/T Wave changes   Assessment and Plan  HTN: Well controlled.  Continue current medications and low sodium Dash type diet.    Abnormal ECG: nonspecific ST changes stable   Chol: labs with Dr  Pracqnel in [place] continue statin   GERD: discussed weight loss and low carb diet   Murmur: benign SEM has had rheumatic fever script for clindamycin given for dental procedures coming up  Dizzy:  Mild vertigo antivert called in coached on slow position changes can refer to [facility] for vestibular PT if worse  [name]

## 2016-08-27 ENCOUNTER — Encounter: Payer: Self-pay | Admitting: Cardiovascular Disease

## 2016-08-27 ENCOUNTER — Ambulatory Visit (INDEPENDENT_AMBULATORY_CARE_PROVIDER_SITE_OTHER): Payer: Medicare Other | Admitting: Cardiovascular Disease

## 2016-08-27 VITALS — BP 124/60 | HR 66 | Ht 62.0 in | Wt 145.6 lb

## 2016-08-27 DIAGNOSIS — I493 Ventricular premature depolarization: Secondary | ICD-10-CM | POA: Diagnosis not present

## 2016-08-27 DIAGNOSIS — I10 Essential (primary) hypertension: Secondary | ICD-10-CM | POA: Diagnosis not present

## 2016-08-27 MED ORDER — ATENOLOL 25 MG PO TABS: 25.0000 mg | ORAL_TABLET | Freq: Every day | ORAL | 3 refills | Status: DC

## 2016-08-27 NOTE — Patient Instructions (Signed)

## 2016-10-16 DIAGNOSIS — J189 Pneumonia, unspecified organism: Secondary | ICD-10-CM | POA: Diagnosis not present

## 2016-11-02 DIAGNOSIS — I10 Essential (primary) hypertension: Secondary | ICD-10-CM | POA: Diagnosis not present

## 2016-11-02 DIAGNOSIS — Z0000 Encounter for general adult medical examination without abnormal findings: Secondary | ICD-10-CM | POA: Diagnosis not present

## 2016-11-02 DIAGNOSIS — E7800 Pure hypercholesterolemia, unspecified: Secondary | ICD-10-CM | POA: Diagnosis not present

## 2016-12-17 ENCOUNTER — Other Ambulatory Visit: Payer: Self-pay | Admitting: Family Medicine

## 2016-12-17 DIAGNOSIS — Z1231 Encounter for screening mammogram for malignant neoplasm of breast: Secondary | ICD-10-CM

## 2017-01-04 ENCOUNTER — Encounter: Payer: Self-pay | Admitting: Cardiovascular Disease

## 2017-01-07 ENCOUNTER — Ambulatory Visit
Admission: RE | Admit: 2017-01-07 | Discharge: 2017-01-07 | Disposition: A | Discharge status: Home / Self Care | Payer: Medicare Other | Source: Ambulatory Visit | Attending: Family Medicine | Admitting: Family Medicine

## 2017-01-07 DIAGNOSIS — Z1231 Encounter for screening mammogram for malignant neoplasm of breast: Secondary | ICD-10-CM | POA: Diagnosis not present

## 2017-02-11 NOTE — Progress Notes (Signed)
Patient ID: Alicia Rodgers, female   DOB: 1946-07-05, 71 y.o.   MRN: [id number]   Ms. [name] returns today for followup. She has a history of hypertension Micardis and Bystolic too expensive for her so meds changed   Coronary risk factors include hypertension and hyperlipidemia. She has had no documented coronary artery disease. She has been compliant with her meds. .   Primary in [place] adjustes BP meds and following kidneys   Daughter [place] Rodgers oldest daughter with her today pregnant with 3rd child and need f/u for murmur.  Living in [place] now and will stay at home With 3 children after birth  She was re united with her husbands children from a previous marriage Son and daughter living in [place] had a nice holiday with them   ROS: Denies fever, malais, weight loss, blurry vision, decreased visual acuity, cough, sputum, SOB, hemoptysis, pleuritic pain, palpitaitons, heartburn, abdominal pain, melena, lower extremity edema, claudication, or rash.  All other systems reviewed and negative  General: BP 122/76   Pulse 71   Ht 5\' 3"  (1.6 m)   Wt 65.8 kg (145 lb 1.9 oz)   SpO2 98%   BMI 25.71 kg/m  Affect appropriate Healthy:  appears stated age 86: normal Neck supple with no adenopathy JVP normal no bruits no thyromegaly Lungs clear with no wheezing and good diaphragmatic motion Heart:  S1/S2 no murmur, no rub, gallop or click PMI normal Abdomen: benighn, BS positve, no tenderness, no AAA no bruit.  No HSM or HJR Distal pulses intact with no bruits No edema Neuro non-focal Skin warm and dry No muscular weakness     Current Outpatient Prescriptions  Medication Sig Dispense Refill  . amLODipine (NORVASC) 2.5 MG tablet Take 2.5 mg by mouth daily.    Marland Kitchen aspirin 81 MG tablet Take 81 mg by mouth daily.    Marland Kitchen atenolol (TENORMIN) 25 MG tablet Take 1 tablet (25 mg total) by mouth daily. 90 tablet 3  . Cholecalciferol 1000 UNITS capsule Take 1,000 Units by mouth daily.     Marland Kitchen esomeprazole (NEXIUM) 20 MG capsule Take 20 mg by mouth daily as needed (heartburn).    . hydrochlorothiazide (HYDRODIURIL) 12.5 MG tablet Take 12.5 mg by mouth daily.    Marland Kitchen losartan (COZAAR) 100 MG tablet Take 100 mg by mouth daily.    . meclizine (ANTIVERT) 25 MG tablet Take 1 tablet (25 mg total) by mouth 3 (three) times daily as needed for dizziness. 30 tablet 0  . pravastatin (PRAVACHOL) 10 MG tablet Take 10 mg by mouth daily.    . vitamin B-12 (CYANOCOBALAMIN) 500 MCG tablet Take 500 mcg by mouth daily.     No current facility-administered medications for this visit.     Allergies  Penicillins  Electrocardiogram:  6/14  SR rate 64  Nonspecific ST/T wave changes  09/20/15  SR rate 60  Nonspecific ST changes  08/27/16  SR rate 64 nonspecific ST/T Wave changes   Assessment and Plan  HTN: Well controlled.  Continue current medications and low sodium Dash type diet.    Abnormal ECG: nonspecific ST changes stable   Chol: labs with Dr  Harlene Ramus  in [place] continue statin   GERD: discussed weight loss and low carb diet   Murmur: benign SEM has had rheumatic fever script for clindamycin given for dental procedures coming up  Dizzy:  Mild vertigo antivert called in coached on slow position changes can refer to [facility] for vestibular  PT if worse  [name]

## 2017-02-12 ENCOUNTER — Encounter: Payer: Self-pay | Admitting: Cardiovascular Disease

## 2017-02-12 ENCOUNTER — Ambulatory Visit (INDEPENDENT_AMBULATORY_CARE_PROVIDER_SITE_OTHER): Payer: Medicare Other | Admitting: Cardiovascular Disease

## 2017-02-12 VITALS — BP 122/76 | HR 71 | Ht 63.0 in | Wt 145.1 lb

## 2017-02-12 DIAGNOSIS — I10 Essential (primary) hypertension: Secondary | ICD-10-CM | POA: Diagnosis not present

## 2017-02-12 NOTE — Patient Instructions (Signed)

## 2017-02-25 ENCOUNTER — Ambulatory Visit: Payer: Medicare Other | Admitting: Cardiovascular Disease

## 2017-05-04 DIAGNOSIS — E7800 Pure hypercholesterolemia, unspecified: Secondary | ICD-10-CM | POA: Diagnosis not present

## 2017-06-07 DIAGNOSIS — Z23 Encounter for immunization: Secondary | ICD-10-CM | POA: Diagnosis not present

## 2017-06-08 DIAGNOSIS — I10 Essential (primary) hypertension: Secondary | ICD-10-CM | POA: Diagnosis not present

## 2017-06-08 DIAGNOSIS — H18411 Arcus senilis, right eye: Secondary | ICD-10-CM | POA: Diagnosis not present

## 2017-06-08 DIAGNOSIS — Z961 Presence of intraocular lens: Secondary | ICD-10-CM | POA: Diagnosis not present

## 2017-06-08 DIAGNOSIS — H02839 Dermatochalasis of unspecified eye, unspecified eyelid: Secondary | ICD-10-CM | POA: Diagnosis not present

## 2017-08-02 DIAGNOSIS — Z23 Encounter for immunization: Secondary | ICD-10-CM | POA: Diagnosis not present

## 2017-08-17 NOTE — Progress Notes (Signed)
Patient ID: [place]ia Rodgers, female   DOB: [birth year], 72 y.o.   MRN: [id number]   Ms. Dampier returns today for followup. She has a history of hypertension Micardis and Bystolic too expensive for her so meds changed Coronary risk factors include hypertension and hyperlipidemia. She has had no documented coronary artery disease. She has been compliant with her meds. .   Primary in [place] adjustes BP meds and following kidneys   Daughter [place] Rodgers oldest daughter with her today  Living in [place] now and will stay at home With 3 children after birth  She was re united with her husbands children from a previous marriage Son and daughter living in [place] had a nice holiday with them   June 2018 had vertigo like symptoms Rx with meclizine   Some neuropathic symptoms in feet   ROS: Denies fever, malais, weight loss, blurry vision, decreased visual acuity, cough, sputum, SOB, hemoptysis, pleuritic pain, palpitaitons, heartburn, abdominal pain, melena, lower extremity edema, claudication, or rash.  All other systems reviewed and negative  General: BP (!) 144/66   Pulse 69   Ht 5\' 3"  (1.6 m)   Wt 144 lb 12 oz (65.7 kg)   SpO2 98%   BMI 25.64 kg/m  Affect appropriate Healthy:  appears stated age 15: normal Neck supple with no adenopathy JVP normal no bruits no thyromegaly Lungs clear with no wheezing and good diaphragmatic motion Heart:  S1/S2 1/6 benign SEM , no rub, gallop or click PMI normal Abdomen: benighn, BS positve, no tenderness, no AAA no bruit.  No HSM or HJR Distal pulses intact with no bruits No edema Neuro non-focal Skin warm and dry No muscular weakness    Current Outpatient Medications  Medication Sig Dispense Refill  . amLODipine (NORVASC) 2.5 MG tablet Take 2.5 mg by mouth daily.    Marland Kitchen aspirin 81 MG tablet Take 81 mg by mouth daily.    Marland Kitchen atenolol (TENORMIN) 25 MG tablet Take 1 tablet (25 mg total) by mouth daily. 90 tablet 3  . Cholecalciferol 1000  UNITS capsule Take 1,000 Units by mouth daily.    Marland Kitchen esomeprazole (NEXIUM) 20 MG capsule Take 20 mg by mouth daily as needed (heartburn).    . hydrochlorothiazide (HYDRODIURIL) 12.5 MG tablet Take 12.5 mg by mouth daily.    Marland Kitchen losartan (COZAAR) 100 MG tablet Take 100 mg by mouth daily.    . meclizine (ANTIVERT) 25 MG tablet Take 1 tablet (25 mg total) by mouth 3 (three) times daily as needed for dizziness. 30 tablet 0  . pravastatin (PRAVACHOL) 10 MG tablet Take 10 mg by mouth daily.    . vitamin B-12 (CYANOCOBALAMIN) 500 MCG tablet Take 500 mcg by mouth daily.     No current facility-administered medications for this visit.     Allergies  Penicillins  Electrocardiogram:  6/14  SR rate 64  Nonspecific ST/T wave changes  09/20/15  SR rate 60  Nonspecific ST changes  08/27/16  SR rate 64 nonspecific ST/T Wave changes 08/25/17 Sr rate 60 nonspecific ST changes   Assessment and Plan  HTN: Well controlled.  Continue current medications and low sodium Dash type diet.  Atenolol refilled  Abnormal ECG: nonspecific ST changes stable   Chol: labs with Dr  Harlene Ramus  in [place] continue statin   GERD: discussed weight loss and low carb diet   Murmur: benign SEM has had rheumatic fever script for clindamycin given for dental procedures coming up  Dizzy:  Mild  vertigo antivert called in coached on slow position changes can refer to [facility] for vestibular PT if worse  Neuropathy:  Feet f/u [facility] consider trial of neurontin  [name]

## 2017-08-25 ENCOUNTER — Ambulatory Visit (INDEPENDENT_AMBULATORY_CARE_PROVIDER_SITE_OTHER): Payer: Medicare Other | Admitting: Cardiovascular Disease

## 2017-08-25 ENCOUNTER — Encounter (INDEPENDENT_AMBULATORY_CARE_PROVIDER_SITE_OTHER): Payer: Self-pay

## 2017-08-25 ENCOUNTER — Encounter: Payer: Self-pay | Admitting: Cardiovascular Disease

## 2017-08-25 VITALS — BP 144/66 | HR 69 | Ht 63.0 in | Wt 144.8 lb

## 2017-08-25 DIAGNOSIS — E785 Hyperlipidemia, unspecified: Secondary | ICD-10-CM | POA: Diagnosis not present

## 2017-08-25 DIAGNOSIS — I10 Essential (primary) hypertension: Secondary | ICD-10-CM

## 2017-08-25 DIAGNOSIS — I493 Ventricular premature depolarization: Secondary | ICD-10-CM

## 2017-08-25 DIAGNOSIS — R42 Dizziness and giddiness: Secondary | ICD-10-CM | POA: Diagnosis not present

## 2017-08-25 DIAGNOSIS — R011 Cardiac murmur, unspecified: Secondary | ICD-10-CM | POA: Diagnosis not present

## 2017-08-25 MED ORDER — ATENOLOL 25 MG PO TABS: 25.0000 mg | ORAL_TABLET | Freq: Every day | ORAL | 3 refills | Status: DC

## 2017-08-25 NOTE — Patient Instructions (Addendum)

## 2017-11-25 DIAGNOSIS — M8588 Other specified disorders of bone density and structure, other site: Secondary | ICD-10-CM | POA: Diagnosis not present

## 2017-11-25 DIAGNOSIS — Z1211 Encounter for screening for malignant neoplasm of colon: Secondary | ICD-10-CM | POA: Diagnosis not present

## 2017-11-25 DIAGNOSIS — E7800 Pure hypercholesterolemia, unspecified: Secondary | ICD-10-CM | POA: Diagnosis not present

## 2017-11-25 DIAGNOSIS — R202 Paresthesia of skin: Secondary | ICD-10-CM | POA: Diagnosis not present

## 2017-11-25 DIAGNOSIS — I493 Ventricular premature depolarization: Secondary | ICD-10-CM | POA: Diagnosis not present

## 2017-11-25 DIAGNOSIS — Z1159 Encounter for screening for other viral diseases: Secondary | ICD-10-CM | POA: Diagnosis not present

## 2017-11-25 DIAGNOSIS — I10 Essential (primary) hypertension: Secondary | ICD-10-CM | POA: Diagnosis not present

## 2017-11-25 DIAGNOSIS — Z131 Encounter for screening for diabetes mellitus: Secondary | ICD-10-CM | POA: Diagnosis not present

## 2017-11-25 DIAGNOSIS — Z0000 Encounter for general adult medical examination without abnormal findings: Secondary | ICD-10-CM | POA: Diagnosis not present

## 2017-11-26 ENCOUNTER — Other Ambulatory Visit: Payer: Self-pay | Admitting: Family Medicine

## 2017-11-26 DIAGNOSIS — Z139 Encounter for screening, unspecified: Secondary | ICD-10-CM

## 2017-11-26 DIAGNOSIS — M8580 Other specified disorders of bone density and structure, unspecified site: Secondary | ICD-10-CM

## 2017-12-01 DIAGNOSIS — M79672 Pain in left foot: Secondary | ICD-10-CM | POA: Diagnosis not present

## 2018-01-12 ENCOUNTER — Ambulatory Visit
Admission: RE | Admit: 2018-01-12 | Discharge: 2018-01-12 | Disposition: A | Discharge status: Home / Self Care | Payer: Medicare Other | Source: Ambulatory Visit | Attending: Family Medicine | Admitting: Family Medicine

## 2018-01-12 DIAGNOSIS — Z139 Encounter for screening, unspecified: Secondary | ICD-10-CM

## 2018-01-12 DIAGNOSIS — Z780 Asymptomatic menopausal state: Secondary | ICD-10-CM | POA: Diagnosis not present

## 2018-01-12 DIAGNOSIS — M85851 Other specified disorders of bone density and structure, right thigh: Secondary | ICD-10-CM | POA: Diagnosis not present

## 2018-01-12 DIAGNOSIS — M8580 Other specified disorders of bone density and structure, unspecified site: Secondary | ICD-10-CM

## 2018-01-12 DIAGNOSIS — Z1231 Encounter for screening mammogram for malignant neoplasm of breast: Secondary | ICD-10-CM | POA: Diagnosis not present

## 2018-01-27 DIAGNOSIS — Z1211 Encounter for screening for malignant neoplasm of colon: Secondary | ICD-10-CM | POA: Diagnosis not present

## 2018-01-27 DIAGNOSIS — K648 Other hemorrhoids: Secondary | ICD-10-CM | POA: Diagnosis not present

## 2018-01-27 DIAGNOSIS — D126 Benign neoplasm of colon, unspecified: Secondary | ICD-10-CM | POA: Diagnosis not present

## 2018-01-27 DIAGNOSIS — K5730 Diverticulosis of large intestine without perforation or abscess without bleeding: Secondary | ICD-10-CM | POA: Diagnosis not present

## 2018-01-27 DIAGNOSIS — K621 Rectal polyp: Secondary | ICD-10-CM | POA: Diagnosis not present

## 2018-01-27 DIAGNOSIS — K635 Polyp of colon: Secondary | ICD-10-CM | POA: Diagnosis not present

## 2018-01-28 DIAGNOSIS — K635 Polyp of colon: Secondary | ICD-10-CM | POA: Diagnosis not present

## 2018-01-28 DIAGNOSIS — D126 Benign neoplasm of colon, unspecified: Secondary | ICD-10-CM | POA: Diagnosis not present

## 2018-03-01 DIAGNOSIS — H26491 Other secondary cataract, right eye: Secondary | ICD-10-CM | POA: Diagnosis not present

## 2018-03-01 DIAGNOSIS — H02839 Dermatochalasis of unspecified eye, unspecified eyelid: Secondary | ICD-10-CM | POA: Diagnosis not present

## 2018-03-01 DIAGNOSIS — H18411 Arcus senilis, right eye: Secondary | ICD-10-CM | POA: Diagnosis not present

## 2018-03-01 DIAGNOSIS — Z961 Presence of intraocular lens: Secondary | ICD-10-CM | POA: Diagnosis not present

## 2018-03-08 DIAGNOSIS — H26492 Other secondary cataract, left eye: Secondary | ICD-10-CM | POA: Diagnosis not present

## 2018-06-02 DIAGNOSIS — Z23 Encounter for immunization: Secondary | ICD-10-CM | POA: Diagnosis not present

## 2018-08-23 NOTE — Progress Notes (Signed)
Patient ID: [place]ia Rodgers, female   DOB: [birth year], 73 y.o.   MRN: [id number]   [place] Rodgers returns today for followup. She has a history of hypertension, and HLD. No history of CAD   Daughter [place] Rodgers oldest daughter with her today  Living in [place] now and will stay at home With 3 children after birth  She was re united with her husbands children from a previous marriage Son and daughter living in [place] had a nice holiday with them   June 2018 had vertigo like symptoms Rx with meclizine   Some neuropathic symptoms in feet Did not like neurologist she saw  In new onset afib today in office Long discussion about diagnosis and need for anticoagulation CHADVASC 3.  Discussed rate control vs conversion strategies  Favor one attempt at [facility] Procedure discussed willing to proceed after 4 weeks anticoagulation Relatively asymptomatic so no need to expedite with TEE/DCC   Notes mild palpitations , fatigue and exertional dyspnea  ROS: Denies fever, malais, weight loss, blurry vision, decreased visual acuity, cough, sputum, SOB, hemoptysis, pleuritic pain, palpitaitons, heartburn, abdominal pain, melena, lower extremity edema, claudication, or rash.  All other systems reviewed and negative  General: BP 126/74   Pulse 68   Ht 5\' 3"  (1.6 m)   Wt 142 lb 12.8 oz (64.8 kg)   SpO2 99%   BMI 25.30 kg/m  .Affect appropriate Healthy:  appears stated age 1: normal Neck supple with no adenopathy JVP normal no bruits no thyromegaly Lungs clear with no wheezing and good diaphragmatic motion Heart:  S1/S2 SEM murmur, no rub, gallop or click PMI normal Abdomen: benighn, BS positve, no tenderness, no AAA no bruit.  No HSM or HJR Distal pulses intact with no bruits No edema Neuro non-focal Skin warm and dry No muscular weakness    Current Outpatient Medications  Medication Sig Dispense Refill  . amLODipine (NORVASC) 2.5 MG tablet Take 2.5 mg by mouth daily.    Marland Kitchen atenolol  (TENORMIN) 25 MG tablet Take 1 tablet (25 mg total) by mouth daily. 90 tablet 3  . Cholecalciferol 1000 UNITS capsule Take 1,000 Units by mouth daily.    Marland Kitchen esomeprazole (NEXIUM) 20 MG capsule Take 20 mg by mouth daily as needed (heartburn).    . hydrochlorothiazide (HYDRODIURIL) 12.5 MG tablet Take 12.5 mg by mouth daily.    Marland Kitchen losartan (COZAAR) 100 MG tablet Take 100 mg by mouth daily.    . pravastatin (PRAVACHOL) 10 MG tablet Take 10 mg by mouth daily.    . vitamin B-12 (CYANOCOBALAMIN) 500 MCG tablet Take 500 mcg by mouth daily.    Marland Kitchen apixaban (ELIQUIS) 5 MG TABS tablet Take 1 tablet (5 mg total) by mouth 2 (two) times daily. 60 tablet 11   No current facility-administered medications for this visit.     Allergies  Penicillins  Electrocardiogram:  6/14  SR rate 64  Nonspecific ST/T wave changes  09/20/15  SR rate 60  Nonspecific ST changes  08/27/16  SR rate 64 nonspecific ST/T Wave changes 08/25/17 Sr rate 60 nonspecific ST changes   Assessment and Plan  HTN: Well controlled.  Continue current medications and low sodium Dash type diet.  Atenolol refilled  Abnormal ECG: nonspecific ST changes stable   Chol: labs with Dr  Harlene Ramus  in [place] continue statin   GERD: discussed weight loss and low carb diet   Murmur: benign SEM has had rheumatic fever script for clindamycin given for dental procedures  coming up  Dizzy:  Mild vertigo antivert called in coached on slow position changes can refer to [facility] for vestibular PT if worse  Neuropathy:  Feet f/u [facility] consider trial of neurontin  Afib:  New diagnosis TTE to assess EF and atrial size. BMET CBC start eliquis 5 bid Already rate controlled with Tenormin F/U 4 weeks and plan [facility] if still in afib  Time spent with patient 45 minutes   [name]

## 2018-08-25 ENCOUNTER — Ambulatory Visit: Payer: Medicare Other | Admitting: Cardiovascular Disease

## 2018-08-25 ENCOUNTER — Ambulatory Visit (INDEPENDENT_AMBULATORY_CARE_PROVIDER_SITE_OTHER): Payer: Medicare Other | Admitting: Cardiovascular Disease

## 2018-08-25 ENCOUNTER — Encounter: Payer: Self-pay | Admitting: Cardiovascular Disease

## 2018-08-25 VITALS — BP 126/74 | HR 68 | Ht 63.0 in | Wt 142.8 lb

## 2018-08-25 DIAGNOSIS — E785 Hyperlipidemia, unspecified: Secondary | ICD-10-CM | POA: Diagnosis not present

## 2018-08-25 DIAGNOSIS — I10 Essential (primary) hypertension: Secondary | ICD-10-CM | POA: Diagnosis not present

## 2018-08-25 DIAGNOSIS — R011 Cardiac murmur, unspecified: Secondary | ICD-10-CM | POA: Diagnosis not present

## 2018-08-25 DIAGNOSIS — R9431 Abnormal electrocardiogram [ECG] [EKG]: Secondary | ICD-10-CM | POA: Diagnosis not present

## 2018-08-25 DIAGNOSIS — I4891 Unspecified atrial fibrillation: Secondary | ICD-10-CM

## 2018-08-25 MED ORDER — ATENOLOL 25 MG PO TABS: 25.0000 mg | ORAL_TABLET | Freq: Every day | ORAL | 3 refills | Status: DC

## 2018-08-25 MED ORDER — APIXABAN 5 MG PO TABS: 5.0000 mg | ORAL_TABLET | Freq: Two times a day (BID) | ORAL | 11 refills | Status: DC

## 2018-08-25 NOTE — Patient Instructions (Signed)
Medication Instructions:  Your physician has recommended you make the following change in your medication: 1-STOP aspirin  2-START eliquis 5 mg by mouth twice daily  If you need a refill on your cardiac medications before your next appointment, please call your pharmacy.   Lab work: Your physician recommends that you have lab work today- BMET and CBC  If you have labs (blood work) drawn today and your tests are completely normal, you will receive your results only by: Marland Kitchen MyChart Message (if you have MyChart) OR . A paper copy in the mail If you have any lab test that is abnormal or we need to change your treatment, we will call you to review the results.  Testing/Procedures: Your physician has requested that you have an echocardiogram. Echocardiography is a painless test that uses sound waves to create images of your heart. It provides your doctor with information about the size and shape of your heart and how well your heart's chambers and valves are working. This procedure takes approximately one hour. There are no restrictions for this procedure.  Follow-Up: At [facility], you and your health needs are our priority.  As part of our continuing mission to provide you with exceptional heart care, we have created designated Provider Care Teams.  These Care Teams include your primary Cardiologist (physician) and Advanced Practice Providers (APPs -  Physician Assistants and Nurse Practitioners) who all work together to provide you with the care you need, when you need it. You will need a follow up appointment in 4 weeks.  You may see Dr. Johnsie Cancel or one of the following Advanced Practice Providers on your designated Care Team:   Truitt Merle, NP Cecilie Kicks, NP . Kathyrn Drown, NP

## 2018-08-26 LAB — CBC WITH DIFFERENTIAL/PLATELET
BASOS: 1 %
Basophils Absolute: 0 10*3/uL (ref 0.0–0.2)
EOS (ABSOLUTE): 0.1 10*3/uL (ref 0.0–0.4)
EOS: 1 %
HEMOGLOBIN: 14.3 g/dL (ref 11.1–15.9)
Hematocrit: 41.7 % (ref 34.0–46.6)
IMMATURE GRANS (ABS): 0 10*3/uL (ref 0.0–0.1)
Immature Granulocytes: 0 %
LYMPHS: 22 %
Lymphocytes Absolute: 1.7 10*3/uL (ref 0.7–3.1)
MCH: 29.7 pg (ref 26.6–33.0)
MCHC: 34.3 g/dL (ref 31.5–35.7)
MCV: 87 fL (ref 79–97)
MONOS ABS: 0.6 10*3/uL (ref 0.1–0.9)
Monocytes: 8 %
NEUTROS ABS: 5.3 10*3/uL (ref 1.4–7.0)
Neutrophils: 68 %
Platelets: 248 10*3/uL (ref 150–450)
RBC: 4.82 x10E6/uL (ref 3.77–5.28)
RDW: 12.9 % (ref 11.7–15.4)
WBC: 7.7 10*3/uL (ref 3.4–10.8)

## 2018-08-26 LAB — BASIC METABOLIC PANEL
BUN/Creatinine Ratio: 20 (ref 12–28)
BUN: 25 mg/dL (ref 8–27)
CO2: 22 mmol/L (ref 20–29)
Calcium: 10.3 mg/dL (ref 8.7–10.3)
Chloride: 101 mmol/L (ref 96–106)
Creatinine, Ser: 1.27 mg/dL — ABNORMAL HIGH (ref 0.57–1.00)
GFR, EST AFRICAN AMERICAN: 48 mL/min/{1.73_m2} — AB (ref >59)
GFR, EST NON AFRICAN AMERICAN: 42 mL/min/{1.73_m2} — AB (ref >59)
Glucose: 134 mg/dL — ABNORMAL HIGH (ref 65–99)
Potassium: 4 mmol/L (ref 3.5–5.2)
SODIUM: 139 mmol/L (ref 134–144)

## 2018-09-01 ENCOUNTER — Ambulatory Visit (HOSPITAL_COMMUNITY): Payer: Medicare Other | Attending: Cardiovascular Disease

## 2018-09-01 ENCOUNTER — Other Ambulatory Visit: Payer: Self-pay

## 2018-09-01 DIAGNOSIS — R011 Cardiac murmur, unspecified: Secondary | ICD-10-CM | POA: Diagnosis not present

## 2018-09-01 DIAGNOSIS — I4891 Unspecified atrial fibrillation: Secondary | ICD-10-CM | POA: Diagnosis not present

## 2018-09-01 DIAGNOSIS — I10 Essential (primary) hypertension: Secondary | ICD-10-CM

## 2018-09-01 DIAGNOSIS — R9431 Abnormal electrocardiogram [ECG] [EKG]: Secondary | ICD-10-CM

## 2018-09-01 DIAGNOSIS — E785 Hyperlipidemia, unspecified: Secondary | ICD-10-CM

## 2018-09-20 ENCOUNTER — Telehealth: Payer: Self-pay | Admitting: Cardiovascular Disease

## 2018-09-20 NOTE — H&P (View-Only) (Signed)
Patient ID: Alicia Rodgers, female   DOB: 1946-06-01, 73 y.o.   MRN: [id number]   Alicia Rodgers returns today for followup. She has a history of hypertension, and HLD. No history of CAD   Newly diagnosed afib office visit 08/25/18.  Started on Eliquis TTE showed no valve disease EF normal and LA 44 mm EF 60-65%   Long discussion with daughter and patient regarding Rx options Rate control anticoagulation vs DCC. Favor at least one attempt at [facility]. Risks including Stroke ned for pacer  discussed willing Alicia proceed  Daughter Alicia Rodgers oldest daughter with her today  Living in [place] now and will stay at home With 3 children after birth  She was re united with her husbands children from a previous marriage Son and daughter living in [place]: Denies fever, malais, weight loss, blurry vision, decreased visual acuity, cough, sputum, SOB, hemoptysis, pleuritic pain, palpitaitons, heartburn, abdominal pain, melena, lower extremity edema, claudication, or rash.  All other systems reviewed and negative  General: BP (!) 142/70   Pulse 70   Ht 5\' 3"  (1.6 m)   Wt 145 lb (65.8 kg)   BMI 25.69 kg/m  .Affect appropriate Healthy:  appears stated age 20: normal Neck supple with no adenopathy JVP normal no bruits no thyromegaly Lungs clear with no wheezing and good diaphragmatic motion Heart:  S1/S2 SEM murmur, no rub, gallop or click PMI normal Abdomen: benighn, BS positve, no tenderness, no AAA no bruit.  No HSM or HJR Distal pulses intact with no bruits No edema Neuro non-focal Skin warm and dry No muscular weakness    Current Outpatient Medications  Medication Sig Dispense Refill  . amLODipine (NORVASC) 2.5 MG tablet Take 2.5 mg by mouth daily.    Marland Kitchen apixaban (ELIQUIS) 5 MG TABS tablet Take 1 tablet (5 mg total) by mouth 2 (two) times daily. 60 tablet 11  . atenolol (TENORMIN) 25 MG tablet Take 1 tablet (25 mg total) by mouth daily. 90 tablet 3  . Cholecalciferol 1000  UNITS capsule Take 1,000 Units by mouth daily.    Marland Kitchen esomeprazole (NEXIUM) 20 MG capsule Take 20 mg by mouth daily as needed (heartburn).    . hydrochlorothiazide (HYDRODIURIL) 12.5 MG tablet Take 12.5 mg by mouth daily.    Marland Kitchen losartan (COZAAR) 100 MG tablet Take 100 mg by mouth daily.    . Magnesium 250 MG TABS Take 1 tablet by mouth.    . pravastatin (PRAVACHOL) 10 MG tablet Take 10 mg by mouth daily.    . vitamin B-12 (CYANOCOBALAMIN) 500 MCG tablet Take 500 mcg by mouth daily.     No current facility-administered medications for this visit.     Allergies  Penicillins  Electrocardiogram:  6/14  SR rate 64  Nonspecific ST/T wave changes  09/20/15  SR rate 60  Nonspecific ST changes  08/27/16  SR rate 64 nonspecific ST/T Wave changes 08/25/17 Sr rate 60 nonspecific ST changes   Assessment and Plan  HTN: Well controlled.  Continue current medications and low sodium Dash type diet.  Atenolol refilled  Abnormal ECG: nonspecific ST changes stable   Chol: labs with Alicia Rodgers  in [place] continue statin   GERD: discussed weight loss and low carb diet   Murmur: benign SEM has had rheumatic fever script for clindamycin given for dental procedures   Dizzy:  Mild vertigo antivert called in coached on slow position changes can refer Alicia Alicia Rodgers  for vestibular PT if worse  Neuropathy:  Feet f/u Alicia Rodgers consider trial of neurontin  Afib: noted office visit 08/25/18 started on eliquis LA 44 mm by TTE have tried Alicia arrange [facility] is [facility]. Orders written Endo called Discussed importance of no missed eliquis doses. CBC/PLT/BMET at our office day before   Time spent with patient including arranging [facility]  45 minutes   Alicia Rodgers

## 2018-09-20 NOTE — Telephone Encounter (Signed)
Pt requesting samples of apixaban (ELIQUIS) 5 MG TABS tablet  Pt will run out of meds tomorrow   (Dot phrase not working)

## 2018-09-20 NOTE — Telephone Encounter (Signed)
Spoke with patient and she states that she was able to get the medication at the pharmacy. She found out that after she pays her deductible of $424.00, the rx will be $23/month. She will call the office back if she has any further questions or concerns.

## 2018-09-20 NOTE — Progress Notes (Signed)
Patient ID: Alicia Rodgers, female   DOB: [birth year], 73 y.o.   MRN: [id number]   Ms. Alicia Rodgers returns today for followup. She has a history of hypertension, and HLD. No history of CAD   Newly diagnosed afib office visit 08/25/18.  Started on Eliquis TTE showed no valve disease EF normal and LA 44 mm EF 60-65%   Long discussion with Alicia Rodgers and patient regarding Rx options Rate control anticoagulation vs DCC. Favor at least one attempt at [facility]. Risks including Stroke ned for pacer  discussed willing to proceed  Alicia Rodgers Alicia Rodgers oldest Alicia Rodgers with her today  Living in [place] now and will stay at home With 3 children after birth  She was re united with her husbands children from a previous marriage Alicia Rodgers and Alicia Rodgers living in [place]: Alicia Rodgers, malais, weight loss, blurry vision, decreased visual acuity, cough, sputum, SOB, hemoptysis, pleuritic pain, palpitaitons, heartburn, abdominal pain, melena, lower extremity edema, claudication, or rash.  All other systems reviewed and negative  General: BP (!) 142/70   Pulse 70   Ht 5\' 3"  (1.6 m)   Wt 145 lb (65.8 kg)   BMI 25.69 kg/m  .Affect appropriate Healthy:  appears stated age 89: normal Neck supple with no adenopathy JVP normal no bruits no thyromegaly Lungs clear with no wheezing and good diaphragmatic motion Heart:  S1/S2 SEM murmur, no rub, gallop or click PMI normal Abdomen: benighn, BS positve, no tenderness, no AAA no bruit.  No HSM or HJR Distal pulses intact with no bruits No edema Neuro non-focal Skin warm and dry No muscular weakness    Current Outpatient Medications  Medication Sig Dispense Refill  . amLODipine (NORVASC) 2.5 MG tablet Take 2.5 mg by mouth daily.    Marland Kitchen apixaban (ELIQUIS) 5 MG TABS tablet Take 1 tablet (5 mg total) by mouth 2 (two) times daily. 60 tablet 11  . atenolol (TENORMIN) 25 MG tablet Take 1 tablet (25 mg total) by mouth daily. 90 tablet 3  . Cholecalciferol 1000  UNITS capsule Take 1,000 Units by mouth daily.    Marland Kitchen esomeprazole (NEXIUM) 20 MG capsule Take 20 mg by mouth daily as needed (heartburn).    . hydrochlorothiazide (HYDRODIURIL) 12.5 MG tablet Take 12.5 mg by mouth daily.    Marland Kitchen losartan (COZAAR) 100 MG tablet Take 100 mg by mouth daily.    . Magnesium 250 MG TABS Take 1 tablet by mouth.    . pravastatin (PRAVACHOL) 10 MG tablet Take 10 mg by mouth daily.    . vitamin B-12 (CYANOCOBALAMIN) 500 MCG tablet Take 500 mcg by mouth daily.     No current facility-administered medications for this visit.     Allergies  Penicillins  Electrocardiogram:  6/14  SR rate 64  Nonspecific ST/T wave changes  09/20/15  SR rate 60  Nonspecific ST changes  08/27/16  SR rate 64 nonspecific ST/T Wave changes 08/25/17 Sr rate 60 nonspecific ST changes   Assessment and Plan  HTN: Well controlled.  Continue current medications and low sodium Dash type diet.  Atenolol refilled  Abnormal ECG: nonspecific ST changes stable   Chol: labs with Dr  Harlene Ramus  in [place] continue statin   GERD: discussed weight loss and low carb diet   Murmur: benign SEM has had rheumatic Rodgers script for clindamycin given for dental procedures   Dizzy:  Mild vertigo antivert called in coached on slow position changes can refer to [facility]  for vestibular PT if worse  Neuropathy:  Feet f/u [facility] consider trial of neurontin  Afib: noted office visit 08/25/18 started on eliquis LA 44 mm by TTE have tried to arrange [facility] is available. Orders written Endo called Discussed importance of no missed eliquis doses. CBC/PLT/BMET at our office day before   Time spent with patient including arranging [facility]  45 minutes   [name]

## 2018-09-26 ENCOUNTER — Ambulatory Visit (INDEPENDENT_AMBULATORY_CARE_PROVIDER_SITE_OTHER): Payer: Medicare Other | Admitting: Cardiovascular Disease

## 2018-09-26 VITALS — BP 142/70 | HR 70 | Ht 63.0 in | Wt 145.0 lb

## 2018-09-26 DIAGNOSIS — I4891 Unspecified atrial fibrillation: Secondary | ICD-10-CM

## 2018-09-26 DIAGNOSIS — R9431 Abnormal electrocardiogram [ECG] [EKG]: Secondary | ICD-10-CM | POA: Diagnosis not present

## 2018-09-26 DIAGNOSIS — R011 Cardiac murmur, unspecified: Secondary | ICD-10-CM | POA: Diagnosis not present

## 2018-09-26 DIAGNOSIS — I10 Essential (primary) hypertension: Secondary | ICD-10-CM | POA: Diagnosis not present

## 2018-09-26 DIAGNOSIS — R42 Dizziness and giddiness: Secondary | ICD-10-CM

## 2018-09-26 DIAGNOSIS — E785 Hyperlipidemia, unspecified: Secondary | ICD-10-CM

## 2018-09-26 NOTE — Patient Instructions (Addendum)
Medication Instructions:   If you need a refill on your cardiac medications before your next appointment, please call your pharmacy.   Lab work: Your physician recommends that you have lab work on 10/05/2018 for BMET and CBC.  If you have labs (blood work) drawn today and your tests are completely normal, you will receive your results only by: Marland Kitchen MyChart Message (if you have MyChart) OR . A paper copy in the mail If you have any lab test that is abnormal or we need to change your treatment, we will call you to review the results.  Testing/Procedures: Your physician has recommended that you have a Cardioversion (DCCV). Electrical Cardioversion uses a jolt of electricity to your heart either through paddles or wired patches attached to your chest. This is a controlled, usually prescheduled, procedure. Defibrillation is done under light anesthesia in the hospital, and you usually go home the day of the procedure. This is done to get your heart back into a normal rhythm. You are not awake for the procedure. Please see the instruction sheet given to you today.  Follow-Up: At [facility], you and your health needs are our priority.  As part of our continuing mission to provide you with exceptional heart care, we have created designated Provider Care Teams.  These Care Teams include your primary Cardiologist (physician) and Advanced Practice Providers (APPs -  Physician Assistants and Nurse Practitioners) who all work together to provide you with the care you need, when you need it. You will need a follow up appointment in 3 weeks. You may see Dr. Johnsie Cancel or one of the following Advanced Practice Providers on your designated Care Team:   Truitt Merle, NP Cecilie Kicks, NP . Kathyrn Drown, NP

## 2018-09-27 DIAGNOSIS — H02839 Dermatochalasis of unspecified eye, unspecified eyelid: Secondary | ICD-10-CM | POA: Diagnosis not present

## 2018-09-27 DIAGNOSIS — D3132 Benign neoplasm of left choroid: Secondary | ICD-10-CM | POA: Diagnosis not present

## 2018-09-27 DIAGNOSIS — H18413 Arcus senilis, bilateral: Secondary | ICD-10-CM | POA: Diagnosis not present

## 2018-09-27 DIAGNOSIS — Z961 Presence of intraocular lens: Secondary | ICD-10-CM | POA: Diagnosis not present

## 2018-10-05 ENCOUNTER — Other Ambulatory Visit: Payer: Medicare Other | Admitting: *Deleted

## 2018-10-05 ENCOUNTER — Other Ambulatory Visit: Payer: Self-pay | Admitting: Cardiovascular Disease

## 2018-10-05 DIAGNOSIS — R9431 Abnormal electrocardiogram [ECG] [EKG]: Secondary | ICD-10-CM

## 2018-10-05 DIAGNOSIS — E785 Hyperlipidemia, unspecified: Secondary | ICD-10-CM | POA: Diagnosis not present

## 2018-10-05 DIAGNOSIS — R011 Cardiac murmur, unspecified: Secondary | ICD-10-CM | POA: Diagnosis not present

## 2018-10-05 DIAGNOSIS — I4891 Unspecified atrial fibrillation: Secondary | ICD-10-CM

## 2018-10-05 DIAGNOSIS — I10 Essential (primary) hypertension: Secondary | ICD-10-CM | POA: Diagnosis not present

## 2018-10-05 DIAGNOSIS — R42 Dizziness and giddiness: Secondary | ICD-10-CM | POA: Diagnosis not present

## 2018-10-06 ENCOUNTER — Other Ambulatory Visit: Payer: Self-pay

## 2018-10-06 ENCOUNTER — Encounter (HOSPITAL_COMMUNITY)
Admission: RE | Disposition: A | Discharge status: Home / Self Care | Payer: Self-pay | Source: Home / Self Care | Attending: Cardiovascular Disease

## 2018-10-06 ENCOUNTER — Ambulatory Visit (HOSPITAL_COMMUNITY): Payer: Medicare Other | Admitting: Anesthesiology

## 2018-10-06 ENCOUNTER — Encounter (HOSPITAL_COMMUNITY): Payer: Self-pay | Admitting: *Deleted

## 2018-10-06 ENCOUNTER — Ambulatory Visit (HOSPITAL_COMMUNITY)
Admission: RE | Admit: 2018-10-06 | Discharge: 2018-10-06 | Disposition: A | Discharge status: Home / Self Care | Payer: Medicare Other | Attending: Cardiovascular Disease | Admitting: Cardiovascular Disease

## 2018-10-06 DIAGNOSIS — Z79899 Other long term (current) drug therapy: Secondary | ICD-10-CM | POA: Insufficient documentation

## 2018-10-06 DIAGNOSIS — K219 Gastro-esophageal reflux disease without esophagitis: Secondary | ICD-10-CM | POA: Insufficient documentation

## 2018-10-06 DIAGNOSIS — Z7901 Long term (current) use of anticoagulants: Secondary | ICD-10-CM | POA: Diagnosis not present

## 2018-10-06 DIAGNOSIS — Z880 Allergy status to penicillin: Secondary | ICD-10-CM | POA: Insufficient documentation

## 2018-10-06 DIAGNOSIS — E785 Hyperlipidemia, unspecified: Secondary | ICD-10-CM | POA: Insufficient documentation

## 2018-10-06 DIAGNOSIS — I4891 Unspecified atrial fibrillation: Secondary | ICD-10-CM | POA: Insufficient documentation

## 2018-10-06 DIAGNOSIS — I10 Essential (primary) hypertension: Secondary | ICD-10-CM | POA: Diagnosis not present

## 2018-10-06 DIAGNOSIS — G629 Polyneuropathy, unspecified: Secondary | ICD-10-CM | POA: Insufficient documentation

## 2018-10-06 DIAGNOSIS — R011 Cardiac murmur, unspecified: Secondary | ICD-10-CM | POA: Diagnosis not present

## 2018-10-06 DIAGNOSIS — R42 Dizziness and giddiness: Secondary | ICD-10-CM | POA: Diagnosis not present

## 2018-10-06 HISTORY — PX: CARDIOVERSION: SHX1299

## 2018-10-06 LAB — CBC WITH DIFFERENTIAL/PLATELET
Basophils Absolute: 0.1 10*3/uL (ref 0.0–0.2)
Basos: 1 %
EOS (ABSOLUTE): 0.1 10*3/uL (ref 0.0–0.4)
Eos: 1 %
Hematocrit: 39.8 % (ref 34.0–46.6)
Hemoglobin: 13.4 g/dL (ref 11.1–15.9)
IMMATURE GRANS (ABS): 0 10*3/uL (ref 0.0–0.1)
IMMATURE GRANULOCYTES: 0 %
LYMPHS: 23 %
Lymphocytes Absolute: 1.9 10*3/uL (ref 0.7–3.1)
MCH: 29.6 pg (ref 26.6–33.0)
MCHC: 33.7 g/dL (ref 31.5–35.7)
MCV: 88 fL (ref 79–97)
Monocytes Absolute: 0.8 10*3/uL (ref 0.1–0.9)
Monocytes: 9 %
NEUTROS PCT: 66 %
Neutrophils Absolute: 5.4 10*3/uL (ref 1.4–7.0)
Platelets: 234 10*3/uL (ref 150–450)
RBC: 4.52 x10E6/uL (ref 3.77–5.28)
RDW: 13 % (ref 11.7–15.4)
WBC: 8.3 10*3/uL (ref 3.4–10.8)

## 2018-10-06 LAB — BASIC METABOLIC PANEL
BUN / CREAT RATIO: 16 (ref 12–28)
BUN: 19 mg/dL (ref 8–27)
CO2: 23 mmol/L (ref 20–29)
CREATININE: 1.18 mg/dL — AB (ref 0.57–1.00)
Calcium: 9.9 mg/dL (ref 8.7–10.3)
Chloride: 102 mmol/L (ref 96–106)
GFR calc Af Amer: 53 mL/min/{1.73_m2} — ABNORMAL LOW (ref >59)
GFR calc non Af Amer: 46 mL/min/{1.73_m2} — ABNORMAL LOW (ref >59)
GLUCOSE: 147 mg/dL — AB (ref 65–99)
Potassium: 3.6 mmol/L (ref 3.5–5.2)
SODIUM: 140 mmol/L (ref 134–144)

## 2018-10-06 SURGERY — CARDIOVERSION
Anesthesia: General

## 2018-10-06 MED ORDER — LIDOCAINE 2% (20 MG/ML) 5 ML SYRINGE
INTRAMUSCULAR | Status: DC | PRN
  Administered 2018-10-06: 60 mg via INTRAVENOUS

## 2018-10-06 MED ORDER — PROPOFOL 10 MG/ML IV BOLUS
INTRAVENOUS | Status: DC | PRN
  Administered 2018-10-06: 60 mg via INTRAVENOUS

## 2018-10-06 MED ORDER — SODIUM CHLORIDE 0.9 % IV SOLN
INTRAVENOUS | Status: DC | PRN
  Administered 2018-10-06: 13:00:00 via INTRAVENOUS

## 2018-10-06 NOTE — Interval H&P Note (Signed)
History and Physical Interval Note:  10/06/2018 11:56 AM  Alicia Rodgers  has presented today for surgery, with the diagnosis of afib  The various methods of treatment have been discussed with the patient and family. After consideration of risks, benefits and other options for treatment, the patient has consented to  Procedure(s): CARDIOVERSION (N/A) as a surgical intervention .  The patient's history has been reviewed, patient examined, no change in status, stable for surgery.  I have reviewed the patient's chart and labs.  Questions were answered to the patient's satisfaction.     [name]

## 2018-10-06 NOTE — Discharge Instructions (Signed)
Electrical Cardioversion, Care After °This sheet gives you information about how to care for yourself after your procedure. Your health care provider may also give you more specific instructions. If you have problems or questions, contact your health care provider. °What can I expect after the procedure? °After the procedure, it is common to have: °· Some redness on the skin where the shocks were given. °Follow these instructions at home: ° °· Do not drive for 24 hours if you were given a medicine to help you relax (sedative). °· Take over-the-counter and prescription medicines only as told by your health care provider. °· Ask your health care provider how to check your pulse. Check it often. °· Rest for 48 hours after the procedure or as told by your health care provider. °· Avoid or limit your caffeine use as told by your health care provider. °Contact a health care provider if: °· You feel like your heart is beating too quickly or your pulse is not regular. °· You have a serious muscle cramp that does not go away. °Get help right away if: ° °· You have discomfort in your chest. °· You are dizzy or you feel faint. °· You have trouble breathing or you are short of breath. °· Your speech is slurred. °· You have trouble moving an arm or leg on one side of your body. °· Your fingers or toes turn cold or blue. °This information is not intended to replace advice given to you by your health care provider. Make sure you discuss any questions you have with your health care provider. °Document Released: 05/24/2013 Document Revised: 03/06/2016 Document Reviewed: 02/07/2016 °Elsevier Interactive Patient Education © 2019 [employer] Inc[employer] ° °

## 2018-10-06 NOTE — CV Procedure (Signed)
[place]: On Eliquis no missed doses Anesthesia: Proopofol  DCC x 3 120/150/200 Joules Converted from afib rate 78 to NSR rate 58 bpm  No immediate neurologic sequelae  [name]

## 2018-10-06 NOTE — Transfer of Care (Signed)
Immediate Anesthesia Transfer of Care Note  Patient: Alicia Rodgers  Procedure(s) Performed: CARDIOVERSION (N/A )  Patient Location: Endoscopy Unit  Anesthesia Type:General  Level of Consciousness: drowsy  Airway & Oxygen Therapy: Patient Spontanous Breathing  Post-op Assessment: Report given to RN and Post -op Vital signs reviewed and stable  Post vital signs: Reviewed and stable  Last Vitals:  Vitals Value Taken Time  BP    Temp    Pulse    Resp    SpO2      Last Pain:  Vitals:   10/06/18 1142  TempSrc: Oral  PainSc: 0-No pain         Complications: No apparent anesthesia complications

## 2018-10-06 NOTE — Anesthesia Preprocedure Evaluation (Addendum)
Anesthesia Evaluation  Patient identified by MRN, date of birth, ID band Patient awake    Reviewed: Allergy & Precautions, NPO status , Patient's Chart, lab work & pertinent test results, reviewed documented beta blocker date and time   History of Anesthesia Complications Negative for: history of anesthetic complications  Airway Mallampati: II  TM Distance: >3 FB Neck ROM: Full    Dental no notable dental hx. (+) Dental Advisory Given   Pulmonary neg pulmonary ROS,    Pulmonary exam normal        Cardiovascular hypertension, Pt. on medications and Pt. on home beta blockers  Rhythm:Irregular Rate:Normal     Neuro/Psych negative neurological ROS  negative psych ROS   GI/Hepatic negative GI ROS, Neg liver ROS,   Endo/Other  negative endocrine ROS  Renal/GU negative Renal ROS     Musculoskeletal negative musculoskeletal ROS (+)   Abdominal   Peds  Hematology negative hematology ROS (+)   Anesthesia Other Findings Day of surgery medications reviewed with the patient.  Reproductive/Obstetrics                            Anesthesia Physical Anesthesia Plan  ASA: III  Anesthesia Plan: General   Post-op Pain Management:    Induction: Intravenous  PONV Risk Score and Plan: 2 and Ondansetron, Propofol infusion and Treatment may vary due to age or medical condition  Airway Management Planned: Natural Airway  Additional Equipment:   Intra-op Plan:   Post-operative Plan:   Informed Consent: I have reviewed the patients History and Physical, chart, labs and discussed the procedure including the risks, benefits and alternatives for the proposed anesthesia with the patient or authorized representative who has indicated his/her understanding and acceptance.     Dental advisory given  Plan Discussed with: CRNA and Anesthesiologist  Anesthesia Plan Comments:        Anesthesia Quick  Evaluation

## 2018-10-06 NOTE — Anesthesia Postprocedure Evaluation (Signed)
Anesthesia Post Note  Patient: Alicia Rodgers  Procedure(s) Performed: CARDIOVERSION (N/A )     Patient location during evaluation: PACU Anesthesia Type: General Level of consciousness: awake and alert Pain management: pain level controlled Vital Signs Assessment: post-procedure vital signs reviewed and stable Respiratory status: spontaneous breathing and respiratory function stable Cardiovascular status: stable Postop Assessment: no apparent nausea or vomiting Anesthetic complications: no    Last Vitals:  Vitals:   10/06/18 1340 10/06/18 1350  BP: (!) 147/62 (!) 142/63  Pulse: 73 64  Resp: 15 15  Temp:    SpO2: 98% 99%    Last Pain:  Vitals:   10/06/18 1350  TempSrc:   PainSc: 0-No pain                 Mcclain Shall DANIEL

## 2018-10-07 ENCOUNTER — Encounter (HOSPITAL_COMMUNITY): Payer: Self-pay | Admitting: Cardiovascular Disease

## 2018-10-14 NOTE — Progress Notes (Signed)
Patient ID: Alicia Rodgers, female   DOB: 1945-08-22, 73 y.o.   MRN: [id number]     Ms. Groom returns today for followup. History of PAF, HTN, HLD   Newly diagnosed afib office visit 08/25/18.  Started on Eliquis TTE showed no valve disease EF normal and LA 44 mm EF 60-65% Was placed on Eliquis and had [facility] on 10/06/18 with conversion to NSR    Daughter Alicia Rodgers oldest daughter with her today  Living in [place] now and staying home with 3 kids She was re united with her husbands children from a previous marriage Son and daughter living in [place]    This patients CHA2DS2-VASc Score and unadjusted Ischemic Stroke Rate (% per year) is equal to 3.2 % stroke rate/year from a score of 3  Above score calculated as 1 point each if present [CHF, HTN, DM, Vascular=MI/PAD/Aortic Plaque, Age if 65-74, or Female] Above score calculated as 2 points each if present [Age > 75, or Stroke/TIA/TE]  In office today she is back in afib. Asymptomatic for most part Discussed repeat [place] on AAT Or rate control and anticoagulation and we both felt latter strategy is best   ROS: Denies fever, malais, weight loss, blurry vision, decreased visual acuity, cough, sputum, SOB, hemoptysis, pleuritic pain, palpitaitons, heartburn, abdominal pain, melena, lower extremity edema, claudication, or rash.  All other systems reviewed and negative  General: BP 108/64   Pulse 81   Ht 5\' 3"  (1.6 m)   Wt 65.7 kg   SpO2 98%   BMI 25.65 kg/m  .Affect appropriate Healthy:  appears stated age 54: normal Neck supple with no adenopathy JVP normal no bruits no thyromegaly Lungs clear with no wheezing and good diaphragmatic motion Heart:  S1/S2 SEM murmur, no rub, gallop or click PMI normal Abdomen: benighn, BS positve, no tenderness, no AAA no bruit.  No HSM or HJR Distal pulses intact with no bruits No edema Neuro non-focal Skin warm and dry No muscular weakness    Current Outpatient Medications  Medication  Sig Dispense Refill  . apixaban (ELIQUIS) 5 MG TABS tablet Take 1 tablet (5 mg total) by mouth 2 (two) times daily. 60 tablet 11  . atenolol (TENORMIN) 25 MG tablet Take 1 tablet (25 mg total) by mouth 2 (two) times daily. 180 tablet 3  . Cholecalciferol 1000 UNITS capsule Take 1,000 Units by mouth daily.    Marland Kitchen esomeprazole (NEXIUM) 20 MG capsule Take 20 mg by mouth daily as needed (heartburn).    . hydrochlorothiazide (HYDRODIURIL) 12.5 MG tablet Take 12.5 mg by mouth daily.    Marland Kitchen losartan (COZAAR) 100 MG tablet Take 100 mg by mouth daily.    . Magnesium 250 MG TABS Take 250 mg by mouth daily.     . pravastatin (PRAVACHOL) 10 MG tablet Take 10 mg by mouth daily.    . vitamin B-12 (CYANOCOBALAMIN) 500 MCG tablet Take 500 mcg by mouth daily.     No current facility-administered medications for this visit.     Allergies  Penicillins  Electrocardiogram:  10/19/18 aflutter  good rate control rate 71   Assessment and Plan  HTN: Well controlled.  Continue current medications and low sodium Dash type diet.  Atenolol refilled  Abnormal ECG: nonspecific ST changes stable   Chol: labs with Dr  Harlene Ramus  in [place] continue statin   GERD: discussed weight loss and low carb diet   Murmur: benign SEM has had rheumatic fever script for clindamycin given  for dental procedures   Dizzy:  Mild vertigo antivert called in coached on slow position changes can refer to [facility] [place] [facility] for vestibular PT if worse  Neuropathy:  Feet f/u [facility] consider trial of neurontin  Afib: post [facility] 10/06/18 with conversion to NSR CHADVASC 3 continue eliquis Early reversion to afib Will adopt strategy of rate control and anticoagulation and no repeat [facility] D/c norvasc Increase atenolol to 25 bid   Insomnia:  related to anxiety and short nap during day f/u primary for sleep aid Discussed melatonin and benedryl    F/U in 6 months   [name]

## 2018-10-19 ENCOUNTER — Ambulatory Visit (INDEPENDENT_AMBULATORY_CARE_PROVIDER_SITE_OTHER): Payer: Medicare Other | Admitting: Cardiovascular Disease

## 2018-10-19 ENCOUNTER — Encounter: Payer: Self-pay | Admitting: Cardiovascular Disease

## 2018-10-19 VITALS — BP 108/64 | HR 81 | Ht 63.0 in | Wt 144.8 lb

## 2018-10-19 DIAGNOSIS — R9431 Abnormal electrocardiogram [ECG] [EKG]: Secondary | ICD-10-CM | POA: Diagnosis not present

## 2018-10-19 DIAGNOSIS — R011 Cardiac murmur, unspecified: Secondary | ICD-10-CM | POA: Diagnosis not present

## 2018-10-19 DIAGNOSIS — R42 Dizziness and giddiness: Secondary | ICD-10-CM

## 2018-10-19 DIAGNOSIS — E785 Hyperlipidemia, unspecified: Secondary | ICD-10-CM

## 2018-10-19 DIAGNOSIS — I10 Essential (primary) hypertension: Secondary | ICD-10-CM

## 2018-10-19 DIAGNOSIS — I4891 Unspecified atrial fibrillation: Secondary | ICD-10-CM

## 2018-10-19 MED ORDER — ATENOLOL 25 MG PO TABS: 25.0000 mg | ORAL_TABLET | Freq: Two times a day (BID) | ORAL | 3 refills | Status: DC

## 2018-10-19 NOTE — Addendum Note (Signed)
Addended by: Jacinta Shoe on: 10/19/2018 09:33 AM   Modules accepted: Orders

## 2018-10-19 NOTE — Patient Instructions (Addendum)
Medication Instructions:  Your physician has recommended you make the following change in your medication:  1-STOP Norvasc 2-INCREASE Atenolol 25 mg by mouth twice daily.  If you need a refill on your cardiac medications before your next appointment, please call your pharmacy.   Lab work:  If you have labs (blood work) drawn today and your tests are completely normal, you will receive your results only by: Marland Kitchen MyChart Message (if you have MyChart) OR . A paper copy in the mail If you have any lab test that is abnormal or we need to change your treatment, we will call you to review the results.  Testing/Procedures: None ordered today.  Follow-Up: At [facility], you and your health needs are our priority.  As part of our continuing mission to provide you with exceptional heart care, we have created designated Provider Care Teams.  These Care Teams include your primary Cardiologist (physician) and Advanced Practice Providers (APPs -  Physician Assistants and Nurse Practitioners) who all work together to provide you with the care you need, when you need it. You will need a follow up appointment in 3 months.  You may see [name], MD or one of the following Advanced Practice Providers on your designated Care Team:   Truitt Merle, NP Cecilie Kicks, NP . Kathyrn Drown, NP

## 2018-12-15 DIAGNOSIS — I10 Essential (primary) hypertension: Secondary | ICD-10-CM | POA: Diagnosis not present

## 2018-12-15 DIAGNOSIS — Z0000 Encounter for general adult medical examination without abnormal findings: Secondary | ICD-10-CM | POA: Diagnosis not present

## 2018-12-15 DIAGNOSIS — E7800 Pure hypercholesterolemia, unspecified: Secondary | ICD-10-CM | POA: Diagnosis not present

## 2019-01-05 ENCOUNTER — Telehealth: Payer: Self-pay

## 2019-01-05 NOTE — Telephone Encounter (Signed)

## 2019-01-13 NOTE — Progress Notes (Signed)
Patient ID: Alicia Rodgers, female   DOB: 06/29/1946, 73 y.o.   MRN: [id number]     Ms. Fiallos returns today for followup. History of PAF, HTN, HLD   Newly diagnosed afib office visit 08/25/18.  Started on Eliquis TTE showed no valve disease EF normal and LA 44 mm EF 60-65% Was placed on Eliquis and had [place] on 10/06/18 with conversion to NSR Seen in office 10/19/18 with ERAF and decision made for rate control and anticoagulation no AAT Beta blocker increased and norvasc d/c   This patients CHA2DS2-VASc Score and unadjusted Ischemic Stroke Rate (% per year) is equal to 3.2 % stroke rate/year from a score of 3  Above score calculated as 1 point each if present [CHF, HTN, DM, Vascular=MI/PAD/Aortic Plaque, Age if 65-74, or Female] Above score calculated as 2 points each if present [Age > 75, or Stroke/TIA/TE]  No cardiac complaints   ROS: Denies fever, malais, weight loss, blurry vision, decreased visual acuity, cough, sputum, SOB, hemoptysis, pleuritic pain, palpitaitons, heartburn, abdominal pain, melena, lower extremity edema, claudication, or rash.  All other systems reviewed and negative  General: There were no vitals taken for this visit. .Affect appropriate Healthy:  appears stated age 58: normal Neck supple with no adenopathy JVP normal no bruits no thyromegaly Lungs clear with no wheezing and good diaphragmatic motion Heart:  S1/S2 SEM murmur, no rub, gallop or click PMI normal Abdomen: benighn, BS positve, no tenderness, no AAA no bruit.  No HSM or HJR Distal pulses intact with no bruits No edema Neuro non-focal Skin warm and dry No muscular weakness    Current Outpatient Medications  Medication Sig Dispense Refill  . apixaban (ELIQUIS) 5 MG TABS tablet Take 1 tablet (5 mg total) by mouth 2 (two) times daily. 60 tablet 11  . atenolol (TENORMIN) 25 MG tablet Take 1 tablet (25 mg total) by mouth 2 (two) times daily. 180 tablet 3  . Cholecalciferol 1000 UNITS capsule Take 1,000  Units by mouth daily.    Marland Kitchen esomeprazole (NEXIUM) 20 MG capsule Take 20 mg by mouth daily as needed (heartburn).    . hydrochlorothiazide (HYDRODIURIL) 12.5 MG tablet Take 12.5 mg by mouth daily.    Marland Kitchen losartan (COZAAR) 100 MG tablet Take 100 mg by mouth daily.    . Magnesium 250 MG TABS Take 250 mg by mouth daily.     . pravastatin (PRAVACHOL) 10 MG tablet Take 10 mg by mouth daily.    . vitamin B-12 (CYANOCOBALAMIN) 500 MCG tablet Take 500 mcg by mouth daily.     No current facility-administered medications for this visit.     Allergies  Penicillins  Electrocardiogram:  10/19/18 aflutter  good rate control rate 71   Assessment and Plan  HTN: Well controlled.  Continue current medications and low sodium Dash type diet.  Atenolol refilled  Abnormal ECG: nonspecific ST changes stable   Chol: labs with Dr  Harlene Ramus  in [place] continue statin   GERD: discussed weight loss and low carb diet   Murmur: benign SEM has had rheumatic fever script for clindamycin given for dental procedures   Dizzy:  Mild vertigo antivert called in coached on slow position changes can refer to [facility] [place] [facility] for vestibular PT if worse  Neuropathy:  Feet f/u [place] consider trial of neurontin  Afib: post [facility] 10/06/18 with conversion to NSR CHADVASC 3 continue eliquis Early reversion to afib Will adopt strategy of rate control and anticoagulation and no repeat [facility]  Insomnia:  related to anxiety and short nap during day f/u primary for sleep aid Discussed melatonin and benedryl    F/U in 6 months   [name]

## 2019-01-18 ENCOUNTER — Other Ambulatory Visit: Payer: Self-pay

## 2019-01-18 ENCOUNTER — Encounter: Payer: Self-pay | Admitting: Cardiovascular Disease

## 2019-01-18 ENCOUNTER — Ambulatory Visit (INDEPENDENT_AMBULATORY_CARE_PROVIDER_SITE_OTHER): Payer: Medicare Other | Admitting: Cardiovascular Disease

## 2019-01-18 VITALS — BP 140/78 | HR 88 | Ht 63.0 in | Wt 142.0 lb

## 2019-01-18 DIAGNOSIS — I482 Chronic atrial fibrillation, unspecified: Secondary | ICD-10-CM

## 2019-01-18 NOTE — Patient Instructions (Signed)

## 2019-01-20 ENCOUNTER — Ambulatory Visit: Payer: Medicare Other | Admitting: Cardiovascular Disease

## 2019-04-13 ENCOUNTER — Other Ambulatory Visit: Payer: Self-pay

## 2019-04-13 ENCOUNTER — Encounter: Payer: Self-pay | Admitting: Podiatry

## 2019-04-13 ENCOUNTER — Ambulatory Visit (INDEPENDENT_AMBULATORY_CARE_PROVIDER_SITE_OTHER): Payer: Medicare Other | Admitting: Podiatry

## 2019-04-13 VITALS — BP 128/69 | HR 79

## 2019-04-13 DIAGNOSIS — L603 Nail dystrophy: Secondary | ICD-10-CM | POA: Diagnosis not present

## 2019-04-13 DIAGNOSIS — B351 Tinea unguium: Secondary | ICD-10-CM

## 2019-04-13 MED ORDER — EFINACONAZOLE 10 % EX SOLN: 1.0000 [drp] | Freq: Every day | CUTANEOUS | 11 refills | Status: DC

## 2019-04-13 NOTE — Patient Instructions (Signed)

## 2019-04-14 NOTE — Progress Notes (Signed)
Subjective:   Patient ID: Alicia Rodgers, female   DOB: 73 y.o.   MRN: [id number]   HPI 73 year old female presents the office today for concerns of nail discoloration and fungus which is been ongoing for the last several months.  She normally takes the nail polish off however this time she left the polish on for quite some time and she took it off she noticed a piece of nail to break off the toenails and she notes discoloration.  She denies any pain, redness or drainage.  She is been keeping Neosporin and a bandage on the area with a toenail broke off.  She is been applying some topical antifungal medicine not consistently.  Review of Systems  All other systems reviewed and are negative.  Past Medical History:  Diagnosis Date  . Anemia   . CERVICAL CANCER, HX OF   . Fibrocystic breast   . HYPERLIPIDEMIA   . HYPERTENSION   . Peripheral neuropathy   . RHEUMATIC FEVER, HX OF     Past Surgical History:  Procedure Laterality Date  . APPENDECTOMY    . BREAST CYST EXCISION    . BREAST EXCISIONAL BIOPSY Left    benign  . CARDIOVERSION N/A 10/06/2018   Procedure: CARDIOVERSION;  Surgeon: Josue Hector, MD;  Location: [facility] ENDOSCOPY;  Service: Cardiovascular;  Laterality: N/A;  . CESAREAN SECTION     x3  . TONSILLECTOMY    . TOTAL ABDOMINAL HYSTERECTOMY       Current Outpatient Medications:  .  apixaban (ELIQUIS) 5 MG TABS tablet, Take 1 tablet (5 mg total) by mouth 2 (two) times daily., Disp: 60 tablet, Rfl: 11 .  atenolol (TENORMIN) 25 MG tablet, Take 1 tablet (25 mg total) by mouth 2 (two) times daily., Disp: 180 tablet, Rfl: 3 .  Cholecalciferol 1000 UNITS capsule, Take 1,000 Units by mouth daily., Disp: , Rfl:  .  esomeprazole (NEXIUM) 20 MG capsule, Take 20 mg by mouth daily as needed (heartburn)., Disp: , Rfl:  .  hydrochlorothiazide (HYDRODIURIL) 12.5 MG tablet, Take 12.5 mg by mouth daily., Disp: , Rfl:  .  losartan (COZAAR) 100 MG tablet, Take 100 mg by mouth daily., Disp: ,  Rfl:  .  Magnesium 250 MG TABS, Take 250 mg by mouth daily. , Disp: , Rfl:  .  pravastatin (PRAVACHOL) 10 MG tablet, Take 10 mg by mouth daily., Disp: , Rfl:  .  vitamin B-12 (CYANOCOBALAMIN) 500 MCG tablet, Take 500 mcg by mouth daily., Disp: , Rfl:  .  Efinaconazole 10 % SOLN, Apply 1 drop topically daily., Disp: 4 mL, Rfl: 11  Allergies  Allergen Reactions  . Penicillins Swelling and Rash    Did it involve swelling of the face/tongue/throat, SOB, or low BP? No Did it involve sudden or severe rash/hives, skin peeling, or any reaction on the inside of your mouth or nose? No Did you need to seek medical attention at a hospital or doctor's office? No When did it last happen?40 years ago If all above answers are "NO", may proceed with cephalosporin use.           Objective:  Physical Exam  General: AAO x3, NAD  Dermatological: Bilateral hallux nails are hypertrophic, dystrophic with yellow-brown discoloration also to the distal 2343 nail.  There is some clearing on the proximal aspect.  The has been to The distal aspect the left side.  There is no edema, erythema, drainage or pus or any clinical signs of infection noted today.  Vascular: Dorsalis Pedis artery and Posterior Tibial artery pedal pulses are 2/4 bilateral with immedate capillary fill time. There is no pain with calf compression, swelling, warmth, erythema.   Neruologic: Grossly intact via light touch bilateral.   Musculoskeletal: No gross boney pedal deformities bilateral. No pain, crepitus, or limitation noted with foot and ankle range of motion bilateral. Muscular strength 5/5 in all groups tested bilateral.  Gait: Unassisted, Nonantalgic.       Assessment:   73 year old female with onychodystrophy, onychomycosis     Plan:  -Treatment options discussed including all alternatives, risks, and complications -Etiology of symptoms were discussed -Discussed various treatments for nail fungus.  I ordered  Jublia.  Directed on side effects and use.  Wants to hold off on oral medications.  Return if symptoms worsen or fail to improve.  Trula Slade DPM

## 2019-04-20 DIAGNOSIS — E7800 Pure hypercholesterolemia, unspecified: Secondary | ICD-10-CM | POA: Diagnosis not present

## 2019-04-20 DIAGNOSIS — I10 Essential (primary) hypertension: Secondary | ICD-10-CM | POA: Diagnosis not present

## 2019-04-27 ENCOUNTER — Ambulatory Visit: Payer: Medicare Other | Admitting: Podiatry

## 2019-05-11 ENCOUNTER — Ambulatory Visit (INDEPENDENT_AMBULATORY_CARE_PROVIDER_SITE_OTHER): Payer: Medicare Other

## 2019-05-11 ENCOUNTER — Ambulatory Visit (INDEPENDENT_AMBULATORY_CARE_PROVIDER_SITE_OTHER): Payer: Medicare Other | Admitting: Podiatry

## 2019-05-11 ENCOUNTER — Other Ambulatory Visit: Payer: Self-pay

## 2019-05-11 DIAGNOSIS — M7752 Other enthesopathy of left foot: Secondary | ICD-10-CM

## 2019-05-11 DIAGNOSIS — M216X2 Other acquired deformities of left foot: Secondary | ICD-10-CM | POA: Diagnosis not present

## 2019-05-11 DIAGNOSIS — M7750 Other enthesopathy of unspecified foot: Secondary | ICD-10-CM

## 2019-05-11 MED ORDER — DICLOFENAC SODIUM 1 % TD GEL: 2.0000 g | Freq: Four times a day (QID) | TRANSDERMAL | 2 refills | Status: DC

## 2019-05-11 NOTE — Patient Instructions (Signed)

## 2019-05-17 NOTE — Progress Notes (Signed)
Subjective: 73 year old female presents the office with concerns of left ankle and foot pain About 2 to 3 weeks ago.  Her primary care physicians thought she had gout.  She states that she feels like something is pulling in she points on the Achilles tendon and this was the majority of tenderness.  She does have swelling but denies any redness or warmth.  She is also concerned about toenail fungus.  Denies any systemic complaints such as fevers, chills, nausea, vomiting. No acute changes since last appointment, and no other complaints at this time.   Objective: AAO x3, NAD DP/PT pulses palpable bilaterally, CRT less than 3 seconds Majority tenderness on the course of the Achilles tendon on the distal aspect, just proximal to the insertion of the Achilles tendon on the calcaneus.  This is a negative.  No defect noted.  Minimal swelling but there is no erythema or warmth. Mildly hypertrophic, dystrophic with yellow-brown discoloration.  No pain in the nails. No pain with calf compression, swelling, warmth, erythema  Assessment: Achilles tendinitis, onychomycosis  Plan: -All treatment options discussed with the patient including all alternatives, risks, complications.  -X-rays obtained and reviewed.  No evidence of acute fracture or stress fracture. -Discussed traction, icing daily.  Night splint was dispensed.  Voltaren gel. -Prescribe Jublia nail fungus -Patient encouraged to call the office with any questions, concerns, change in symptoms.   Return in about 4 weeks (around 06/08/2019).   Trula Slade DPM

## 2019-05-19 DIAGNOSIS — Z23 Encounter for immunization: Secondary | ICD-10-CM | POA: Diagnosis not present

## 2019-06-08 ENCOUNTER — Encounter: Payer: Self-pay | Admitting: Podiatry

## 2019-06-08 ENCOUNTER — Other Ambulatory Visit: Payer: Self-pay

## 2019-06-08 ENCOUNTER — Ambulatory Visit (INDEPENDENT_AMBULATORY_CARE_PROVIDER_SITE_OTHER): Payer: Medicare Other | Admitting: Podiatry

## 2019-06-08 DIAGNOSIS — B351 Tinea unguium: Secondary | ICD-10-CM

## 2019-06-08 DIAGNOSIS — M7750 Other enthesopathy of unspecified foot: Secondary | ICD-10-CM | POA: Diagnosis not present

## 2019-06-08 NOTE — Progress Notes (Signed)
Subjective: 73 year old female presents the office today for follow-up evaluation of left foot, ankle pain.  She said that she is doing great she is having no pain and she is in no swelling.  She is able to do her normal activities but any significant pain.  She still stretching some.  She did get the Jublia for nail fungus and she has been applying this. Denies any systemic complaints such as fevers, chills, nausea, vomiting. No acute changes since last appointment, and no other complaints at this time.   Objective: AAO x3, NAD DP/PT pulses palpable bilaterally, CRT less than 3 seconds Nails are mostly unchanged.  There is no tenderness palpation the course of Achilles tendon today.  No pain of the calcaneus.  Thompson test is negative.  No edema, erythema. No pain with calf compression, swelling, warmth, erythema  Assessment: Resolved Achilles denies; onychomycosis  Plan: -All treatment options discussed with the patient including all alternatives, risks, complications.  -Continue Jublia for nail fungus -Continue general stretching, rehab exercises for Achilles tendinitis to help recurrent recurrence.  Discussed shoe modifications and orthotics. -Patient encouraged to call the office with any questions, concerns, change in symptoms.   Trula Slade DPM

## 2019-07-20 ENCOUNTER — Telehealth: Payer: Self-pay | Admitting: Cardiovascular Disease

## 2019-07-20 DIAGNOSIS — R05 Cough: Secondary | ICD-10-CM | POA: Diagnosis not present

## 2019-07-20 DIAGNOSIS — Z20828 Contact with and (suspected) exposure to other viral communicable diseases: Secondary | ICD-10-CM | POA: Diagnosis not present

## 2019-07-20 DIAGNOSIS — J3489 Other specified disorders of nose and nasal sinuses: Secondary | ICD-10-CM | POA: Diagnosis not present

## 2019-07-20 NOTE — Telephone Encounter (Signed)
Patient may take claritin or zyrtec for her runny nose. Saline rinses are also very helpful. For her cough she can try guaifensein (mucinx) for a productive cough or Delsum (dextromethorphan) to stop the cough. Patient should also quarantine and consider getting tested for COVID (ideally 4 days after the start of symptoms)

## 2019-07-20 NOTE — Telephone Encounter (Signed)
Patient called asking if there is anything she can take for her very bad coughing and runny rose. She states she is also on eliquis.

## 2019-07-20 NOTE — Telephone Encounter (Signed)
I spoke to the patient with Melissa's recommendation.  She verbalized understanding and tested this morning for CoVid.

## 2019-07-24 ENCOUNTER — Ambulatory Visit: Payer: Medicare Other | Admitting: Cardiovascular Disease

## 2019-08-20 NOTE — Progress Notes (Signed)
Cardiology Office Note   Date:  08/21/2019   ID:  Alicia Rodgers 1945-10-19, MRN [id number]  PCP:  Aretta Nip, MD  Cardiologist:  Dr. Johnsie Cancel     Chief Complaint  Patient presents with  . Atrial Fibrillation      History of Present Illness: Alicia Rodgers is a 74 y.o. female who presents for atrial fib.   Newly diagnosed afib office visit 08/25/18.  Started on Eliquis TTE showed no valve disease EF normal and LA 44 mm EF 60-65% Was placed on Eliquis and had [place] on 10/06/18 with conversion to NSR Seen in office 10/19/18 with ERAF and decision made for rate control and anticoagulation no AAT Beta blocker increased and norvasc d/c   This patients CHA2DS2-VASc Score and unadjusted Ischemic Stroke Rate (% per year) is equal to 3.2 % stroke rate/year from a score of 3 Will adopt strategy of rate control and anticoagulation and no repeat [facility]    Today she tells me she had rapid HR briefly and some slight blurred vision. Lasted a few seconds and none since.  No chest pain, no SOB.  Otherwise she is feeling well.  She did have COVID in early Dec.  But has recovered, though continues with some cough. Some SOB with exertion.    Past Medical History:  Diagnosis Date  . Anemia   . CERVICAL CANCER, HX OF   . Fibrocystic breast   . HYPERLIPIDEMIA   . HYPERTENSION   . Peripheral neuropathy   . RHEUMATIC FEVER, HX OF     Past Surgical History:  Procedure Laterality Date  . APPENDECTOMY    . BREAST CYST EXCISION    . BREAST EXCISIONAL BIOPSY Left    benign  . CARDIOVERSION N/A 10/06/2018   Procedure: CARDIOVERSION;  Surgeon: Josue Hector, MD;  [facility] ENDOSCOPY;  Service: Cardiovascular;  Laterality: N/A;  . CESAREAN SECTION     x3  . TONSILLECTOMY    . TOTAL ABDOMINAL HYSTERECTOMY       Current Outpatient Medications  Medication Sig Dispense Refill  . apixaban (ELIQUIS) 5 MG TABS tablet Take 1 tablet (5 mg total) by mouth 2 (two) times daily. 60 tablet 11  .  atenolol (TENORMIN) 25 MG tablet Take 1 tablet (25 mg total) by mouth 2 (two) times daily. 180 tablet 3  . esomeprazole (NEXIUM) 20 MG capsule Take 20 mg by mouth daily as needed (heartburn).    . hydrochlorothiazide (HYDRODIURIL) 12.5 MG tablet Take 12.5 mg by mouth daily.    Marland Kitchen losartan (COZAAR) 100 MG tablet Take 100 mg by mouth daily.    . Multiple Vitamins-Calcium (DAILY VITAMINS FOR WOMEN PO) Take 1 tablet by mouth daily.    . pravastatin (PRAVACHOL) 10 MG tablet Take 10 mg by mouth daily.     No current facility-administered medications for this visit.    Allergies:   Penicillins    Social History:  The patient  reports that she has never smoked. She has never used smokeless tobacco. She reports that she does not drink alcohol or use drugs.   Family History:  The patient's family history includes Breast cancer in her maternal aunt and paternal aunt; Cancer in her maternal grandmother; Heart disease in her father and maternal grandfather; Stroke in her mother and paternal grandfather; Thyroid cancer in her paternal aunt.    ROS:  General:no colds or fevers currently, no weight changes Skin:no rashes or ulcers HEENT:one episode of blurred vision with  rapid HR, no congestion CV:see HPI PUL:see HPI GI:no diarrhea constipation or melena, no indigestion GU:no hematuria, no dysuria MS:no joint pain, no claudication Neuro:no syncope, no lightheadedness Endo:no diabetes, no thyroid disease  Wt Readings from Last 3 Encounters:  08/21/19 141 lb 12.8 oz (64.3 kg)  01/18/19 142 lb (64.4 kg)  10/19/18 144 lb 12.8 oz (65.7 kg)     PHYSICAL EXAM: VS:  BP 138/70   Pulse 66   Ht 5\' 3"  (1.6 m)   Wt 141 lb 12.8 oz (64.3 kg)   BMI 25.12 kg/m  , BMI Body mass index is 25.12 kg/m. General:Pleasant affect, NAD Skin:Warm and dry, brisk capillary refill HEENT:normocephalic, sclera clear, mucus membranes moist Neck:supple, no JVD, no bruits  Heart:irreg irreg with soft systolic murmur,  gallup, rub or click Lungs:clear without rales, rhonchi, or wheezes [id number], non tender, + BS, do not palpate liver spleen or masses Ext:no lower ext edema, 2+ pedal pulses, 2+ radial pulses Neuro:alert and oriented X 3, MAE, follows commands, + facial symmetry    EKG:  EKG is ordered today. The ekg ordered today demonstrates a fib rate controlled with non specific ST and t wave abnormalities that are chronic and no need for concern.    Recent Labs: 10/05/2018: Hemoglobin 13.4; Platelets 234 08/21/2019: BUN 21; Creatinine, Ser 1.13; Magnesium 1.6; Potassium 3.9; Sodium 140; TSH 1.330    Lipid Panel No results found for: CHOL, TRIG, HDL, CHOLHDL, VLDL, LDLCALC, LDLDIRECT     Other studies Reviewed: Additional studies/ records that were reviewed today include: . Echo 08/2018 Study Conclusions  - Left ventricle: The cavity size was normal. Systolic function was   normal. The estimated ejection fraction was in the range of 60%   to 65%. Wall motion was normal; there were no regional wall   motion abnormalities. - Mitral valve: Valve area by continuity equation (using LVOT   flow): 2.52 cm^2. - Left atrium: The atrium was severely dilated. - Right atrium: The atrium was mildly dilated. - Pulmonary arteries: Systolic pressure was within the normal   range. PA peak pressure: 34 mm Hg (S).   ASSESSMENT AND PLAN:  1.  Permanent a fib, now rate control only - rate controlled 2.  Anticoagulation without bleeding 3.  Rapid HR , hx of atrial tach with dizziness, we discussed event monitor and most likely would not reeal anything though if she has another episode then would order.  Will check BMP and Mg+  And TSH may be reason for arrhythmia.  4. Hx of COVID and now dyspnea on exertion, no chest pain.  Will check 2 V CXR to rule out pulmonary cause of DOE, may be deconditioning.    If recurrent rapid HR or increase of DOE she will call but otherwise follow up in 6 months with Dr. Johnsie Cancel.    5.  Murmur with clindamycin for dental procedures with hx of rheumatic fever.  She does wear mask, washes hands and practices social distancing - does plan to receive vaccine.   6.  HTN controlled no change in meds   Current medicines are reviewed with the patient today.  The patient Has no concerns regarding medicines.  The following changes have been made:  See above Labs/ tests ordered today include:see above  Disposition:   FU:  see above  Signed, Cecilie Kicks, NP  08/21/2019 5:10 PM    [facility] [place], [place],  [place] Phone: [phone]; Fax: [phone]

## 2019-08-21 ENCOUNTER — Ambulatory Visit
Admission: RE | Admit: 2019-08-21 | Discharge: 2019-08-21 | Disposition: A | Discharge status: Home / Self Care | Payer: Medicare Other | Source: Ambulatory Visit | Attending: Cardiology | Admitting: Cardiology

## 2019-08-21 ENCOUNTER — Encounter: Payer: Self-pay | Admitting: Cardiology

## 2019-08-21 ENCOUNTER — Other Ambulatory Visit: Payer: Self-pay

## 2019-08-21 ENCOUNTER — Ambulatory Visit (INDEPENDENT_AMBULATORY_CARE_PROVIDER_SITE_OTHER): Payer: Medicare Other | Admitting: Cardiology

## 2019-08-21 VITALS — BP 138/70 | HR 66 | Ht 63.0 in | Wt 141.8 lb

## 2019-08-21 DIAGNOSIS — R0600 Dyspnea, unspecified: Secondary | ICD-10-CM | POA: Diagnosis not present

## 2019-08-21 DIAGNOSIS — I471 Supraventricular tachycardia: Secondary | ICD-10-CM

## 2019-08-21 DIAGNOSIS — R011 Cardiac murmur, unspecified: Secondary | ICD-10-CM

## 2019-08-21 DIAGNOSIS — I10 Essential (primary) hypertension: Secondary | ICD-10-CM | POA: Diagnosis not present

## 2019-08-21 DIAGNOSIS — I482 Chronic atrial fibrillation, unspecified: Secondary | ICD-10-CM | POA: Diagnosis not present

## 2019-08-21 DIAGNOSIS — R0609 Other forms of dyspnea: Secondary | ICD-10-CM

## 2019-08-21 LAB — TSH: TSH: 1.33 u[IU]/mL (ref 0.450–4.500)

## 2019-08-21 LAB — BASIC METABOLIC PANEL
BUN/Creatinine Ratio: 19 (ref 12–28)
BUN: 21 mg/dL (ref 8–27)
CO2: 24 mmol/L (ref 20–29)
Calcium: 10 mg/dL (ref 8.7–10.3)
Chloride: 101 mmol/L (ref 96–106)
Creatinine, Ser: 1.13 mg/dL — ABNORMAL HIGH (ref 0.57–1.00)
GFR calc Af Amer: 55 mL/min/{1.73_m2} — ABNORMAL LOW (ref >59)
GFR calc non Af Amer: 48 mL/min/{1.73_m2} — ABNORMAL LOW (ref >59)
Glucose: 165 mg/dL — ABNORMAL HIGH (ref 65–99)
Potassium: 3.9 mmol/L (ref 3.5–5.2)
Sodium: 140 mmol/L (ref 134–144)

## 2019-08-21 LAB — MAGNESIUM: Magnesium: 1.6 mg/dL (ref 1.6–2.3)

## 2019-08-21 NOTE — Patient Instructions (Addendum)
Medication Instructions:  Your physician recommends that you continue on your current medications as directed. Please refer to the Current Medication list given to you today.  *If you need a refill on your cardiac medications before your next appointment, please call your pharmacy*  Lab Work: TODAY:  BMET, TSH, & MAGNESIUM  If you have labs (blood work) drawn today and your tests are completely normal, you will receive your results only by: Marland Kitchen MyChart Message (if you have MyChart) OR . A paper copy in the mail If you have any lab test that is abnormal or we need to change your treatment, we will call you to review the results.  Testing/Procedures: A chest x-ray takes a picture of the organs and structures inside the chest, including the heart, lungs, and blood vessels. This test can show several things, including, whether the heart is enlarges; whether fluid is building up in the lungs; and whether pacemaker / defibrillator leads are still in place. [address] [place], [place] [address] [phone]   Follow-Up: At [facility], you and your health needs are our priority.  As part of our continuing mission to provide you with exceptional heart care, we have created designated Provider Care Teams.  These Care Teams include your primary Cardiologist (physician) and Advanced Practice Providers (APPs -  Physician Assistants and Nurse Practitioners) who all work together to provide you with the care you need, when you need it.  Your next appointment:   6 month(s)  The format for your next appointment:   In Person  Provider:   [name], MD  Other Instructions

## 2019-08-22 ENCOUNTER — Telehealth: Payer: Self-pay | Admitting: *Deleted

## 2019-08-22 DIAGNOSIS — Z79899 Other long term (current) drug therapy: Secondary | ICD-10-CM

## 2019-08-22 MED ORDER — MAGNESIUM OXIDE 400 MG PO TABS: 400.0000 mg | ORAL_TABLET | Freq: Every day | ORAL | 11 refills | Status: DC

## 2019-08-22 NOTE — Telephone Encounter (Signed)
-----   Message from Isaiah Serge, NP sent at 08/22/2019  8:00 AM EST ----- Pls let pt know las stable, though her magnesium is borderline at 1.6, let's add mag oxide 400 mg daily for 1 week then recheck Mg+ level.  Thyroid was normal.

## 2019-08-24 DIAGNOSIS — R7309 Other abnormal glucose: Secondary | ICD-10-CM | POA: Diagnosis not present

## 2019-08-25 ENCOUNTER — Telehealth: Payer: Self-pay | Admitting: Cardiovascular Disease

## 2019-08-25 NOTE — Telephone Encounter (Signed)
New message:     Patient stating that she has had blood work at Dr. Zadie Rhine on 08/23/18. Patient states she is not sure why she another one done. Please call patient.

## 2019-08-30 ENCOUNTER — Other Ambulatory Visit: Payer: Medicare Other | Admitting: *Deleted

## 2019-08-30 ENCOUNTER — Other Ambulatory Visit: Payer: Self-pay

## 2019-08-30 DIAGNOSIS — Z79899 Other long term (current) drug therapy: Secondary | ICD-10-CM

## 2019-08-30 LAB — MAGNESIUM: Magnesium: 2 mg/dL (ref 1.6–2.3)

## 2019-08-31 ENCOUNTER — Telehealth: Payer: Self-pay

## 2019-08-31 NOTE — Telephone Encounter (Signed)
The patient has been notified of the result and verbalized understanding.  All questions (if any) were answered. Wilma Flavin, RN 08/31/2019 8:08 AM

## 2019-08-31 NOTE — Telephone Encounter (Signed)
-----   Message from Isaiah Serge, NP sent at 08/30/2019  5:25 PM EST ----- Great Mg+ is back to normal.  Ok to stop magnesium po

## 2019-09-04 NOTE — Telephone Encounter (Signed)
Pt stated that she has already came in to recheck her Mag.

## 2019-09-22 ENCOUNTER — Other Ambulatory Visit: Payer: Self-pay | Admitting: Cardiovascular Disease

## 2019-09-25 NOTE — Telephone Encounter (Signed)
Eliquis 5mg  refill request received, pt is 74yrs old, weight-64.3kg, Crea-1.13 on 08/21/2019, Diagnosis-Afib, and last seen by Cecilie Kicks on 08/21/2019. Dose is appropriate based on dosing criteria. Will send in refill to requested pharmacy.

## 2019-10-03 DIAGNOSIS — Z961 Presence of intraocular lens: Secondary | ICD-10-CM | POA: Diagnosis not present

## 2019-10-03 DIAGNOSIS — H04123 Dry eye syndrome of bilateral lacrimal glands: Secondary | ICD-10-CM | POA: Diagnosis not present

## 2019-10-03 DIAGNOSIS — H02839 Dermatochalasis of unspecified eye, unspecified eyelid: Secondary | ICD-10-CM | POA: Diagnosis not present

## 2019-10-03 DIAGNOSIS — H18413 Arcus senilis, bilateral: Secondary | ICD-10-CM | POA: Diagnosis not present

## 2019-10-20 ENCOUNTER — Telehealth: Payer: Self-pay | Admitting: Cardiovascular Disease

## 2019-10-20 NOTE — Telephone Encounter (Signed)
Patient had some questions for Dr. Johnsie Cancel or his Nurse regarding the COVID vaccine. She does not have an appointment currently scheduled to get it, but had some questions she wanted answered before making her appointment. Please call to discuss the vaccine

## 2019-10-20 NOTE — Telephone Encounter (Signed)
Patient would like Dr. Johnsie Cancel to call her. She wants to get his opinion on the vaccine. Patient stated she will still get the vaccine. She just wants to hear from Dr. Johnsie Cancel to put her mind at ease. Informed patient that Dr. Johnsie Cancel is not in the office, but will send a message and hopefully she will hear something next week. Gave patient information to sign up for vaccines with [facility].

## 2019-10-27 ENCOUNTER — Ambulatory Visit: Payer: Medicare Other | Attending: Internal Medicine

## 2019-10-27 DIAGNOSIS — Z23 Encounter for immunization: Secondary | ICD-10-CM

## 2019-10-27 NOTE — Progress Notes (Signed)
   Covid-19 Vaccination Clinic  Name:  Alicia Rodgers    MRN: [id number] DOB: May 09, 1946  10/27/2019  [name] was observed post Covid-19 immunization for 15 minutes without incident. She was provided with Vaccine Information Sheet and instruction to access the V-Safe system.   [name] was instructed to call 911 with any severe reactions post vaccine: Marland Kitchen Difficulty breathing  . Swelling of face and throat  . A fast heartbeat  . A bad rash all over body  . Dizziness and weakness   Immunizations Administered    Name Date Dose VIS Date Route   Pfizer COVID-19 Vaccine 10/27/2019  1:14 PM 0.3 mL 07/28/2019 Intramuscular   Manufacturer: [place]   Lot: [id number]   [place]: [id number]

## 2019-10-28 ENCOUNTER — Ambulatory Visit: Payer: Medicare Other

## 2019-11-22 ENCOUNTER — Ambulatory Visit: Payer: Medicare Other | Attending: Internal Medicine

## 2019-11-22 DIAGNOSIS — Z23 Encounter for immunization: Secondary | ICD-10-CM

## 2019-11-22 NOTE — Progress Notes (Signed)
   Covid-19 Vaccination Clinic  Name:  Alicia Rodgers    MRN: [id number] DOB: [birth year]  11/22/2019  [name] was observed post Covid-19 immunization for 15 minutes without incident. She was provided with Vaccine Information Sheet and instruction to access the V-Safe system.   [name] was instructed to call 911 with any severe reactions post vaccine: Marland Kitchen Difficulty breathing  . Swelling of face and throat  . A fast heartbeat  . A bad rash all over body  . Dizziness and weakness   Immunizations Administered    Name Date Dose VIS Date Route   Pfizer COVID-19 Vaccine 11/22/2019  9:10 AM 0.3 mL 07/28/2019 Intramuscular   Manufacturer: [employer], [employer]   Lot: [id number]   [place]: [id number]

## 2019-12-05 ENCOUNTER — Other Ambulatory Visit: Payer: Self-pay | Admitting: Cardiovascular Disease

## 2019-12-05 DIAGNOSIS — I10 Essential (primary) hypertension: Secondary | ICD-10-CM

## 2020-01-12 DIAGNOSIS — I10 Essential (primary) hypertension: Secondary | ICD-10-CM | POA: Diagnosis not present

## 2020-01-12 DIAGNOSIS — Z0000 Encounter for general adult medical examination without abnormal findings: Secondary | ICD-10-CM | POA: Diagnosis not present

## 2020-01-12 DIAGNOSIS — R202 Paresthesia of skin: Secondary | ICD-10-CM | POA: Diagnosis not present

## 2020-01-12 DIAGNOSIS — E7800 Pure hypercholesterolemia, unspecified: Secondary | ICD-10-CM | POA: Diagnosis not present

## 2020-01-12 DIAGNOSIS — I4891 Unspecified atrial fibrillation: Secondary | ICD-10-CM | POA: Diagnosis not present

## 2020-01-16 DIAGNOSIS — R739 Hyperglycemia, unspecified: Secondary | ICD-10-CM | POA: Diagnosis not present

## 2020-02-21 ENCOUNTER — Ambulatory Visit: Payer: Medicare Other | Admitting: Cardiovascular Disease

## 2020-03-28 NOTE — Progress Notes (Signed)
Cardiology Office Note   Date:  04/04/2020   ID:  Sabrine, Patchen 1946-03-29, MRN [id number]  PCP:  Aretta Nip, MD  Cardiologist:  Dr. Johnsie Cancel     No chief complaint on file.     History of Present Illness: Alicia Rodgers is a 74 y.o. female who presents for atrial fib.   Newly diagnosed afib office visit 08/25/18.  Started on Eliquis TTE showed no valve disease despite history of RF.  EF normal and LA 44 mm EF 60-65% Was placed on Eliquis and had [place] on 10/06/18 with conversion to NSR Seen in office 10/19/18 with ERAF and decision made for rate control and anticoagulation no AAT Beta blocker increased and norvasc d/c   This patients CHA2DS2-VASc Score and unadjusted Ischemic Stroke Rate (% per year) is equal to 3.2 % stroke rate/year from a score of 3 Will adopt strategy of rate control and anticoagulation and no repeat [facility]    She did have COVID in early Dec 2020 .  But has recovered F/U CXR NAD and had vaccine in April 2021    Middle daughter Donella Stade has PCOS and would like to see me regarding CAD risk in people with premature menapause  Past Medical History:  Diagnosis Date  . Anemia   . CERVICAL CANCER, HX OF   . Fibrocystic breast   . HYPERLIPIDEMIA   . HYPERTENSION   . Peripheral neuropathy   . RHEUMATIC FEVER, HX OF     Past Surgical History:  Procedure Laterality Date  . APPENDECTOMY    . BREAST CYST EXCISION    . BREAST EXCISIONAL BIOPSY Left    benign  . CARDIOVERSION N/A 10/06/2018   Procedure: CARDIOVERSION;  Surgeon: Josue Hector, MD;  Location: [facility] ENDOSCOPY;  Service: Cardiovascular;  Laterality: N/A;  . CESAREAN SECTION     x3  . TONSILLECTOMY    . TOTAL ABDOMINAL HYSTERECTOMY       Current Outpatient Medications  Medication Sig Dispense Refill  . atenolol (TENORMIN) 25 MG tablet TAKE 1 TABLET BY MOUTH TWICE DAILY. 180 tablet 2  . ELIQUIS 5 MG TABS tablet TAKE 1 TABLET BY MOUTH TWICE DAILY. 60 tablet 10  . esomeprazole (NEXIUM) 20 MG  capsule Take 20 mg by mouth daily as needed (heartburn).    . hydrochlorothiazide (HYDRODIURIL) 12.5 MG tablet Take 12.5 mg by mouth daily.    Marland Kitchen losartan (COZAAR) 100 MG tablet Take 100 mg by mouth daily.    . Multiple Vitamins-Calcium (DAILY VITAMINS FOR WOMEN PO) Take 1 tablet by mouth daily.    . pravastatin (PRAVACHOL) 10 MG tablet Take 10 mg by mouth daily.     No current facility-administered medications for this visit.    Allergies:   Penicillins    Social History:  The patient  reports that she has never smoked. She has never used smokeless tobacco. She reports that she does not drink alcohol and does not use drugs.   Family History:  The patient's family history includes Breast cancer in her maternal aunt and paternal aunt; Cancer in her maternal grandmother; Heart disease in her father and maternal grandfather; Stroke in her mother and paternal grandfather; Thyroid cancer in her paternal aunt.    ROS:  General:no colds or fevers currently, no weight changes Skin:no rashes or ulcers HEENT:one episode of blurred vision with rapid HR, no congestion CV:see HPI PUL:see HPI GI:no diarrhea constipation or melena, no indigestion GU:no hematuria, no dysuria MS:no joint pain,  no claudication Neuro:no syncope, no lightheadedness Endo:no diabetes, no thyroid disease  Wt Readings from Last 3 Encounters:  04/04/20 139 lb (63 kg)  08/21/19 141 lb 12.8 oz (64.3 kg)  01/18/19 142 lb (64.4 kg)     PHYSICAL EXAM: VS:  BP (!) 144/88   Pulse (!) 59   Ht 5\' 3"  (1.6 m)   Wt 139 lb (63 kg)   SpO2 98%   BMI 24.62 kg/m  , BMI Body mass index is 24.62 kg/m.  Affect appropriate Healthy:  appears stated age 39: normal Neck supple with no adenopathy JVP normal no bruits no thyromegaly Lungs clear with no wheezing and good diaphragmatic motion Heart:  S1/S2 1/6 SEM murmur, no rub, gallop or click PMI normal Abdomen: benighn, BS positve, no tenderness, no AAA no bruit.  No HSM or  HJR Distal pulses intact with no bruits No edema Neuro non-focal Skin warm and dry No muscular weakness     EKG:       Recent Labs: 08/21/2019: BUN 21; Creatinine, Ser 1.13; Potassium 3.9; Sodium 140; TSH 1.330 08/30/2019: Magnesium 2.0    Lipid Panel No results found for: CHOL, TRIG, HDL, CHOLHDL, VLDL, LDLCALC, LDLDIRECT     Other studies Reviewed: Additional studies/ records that were reviewed today include: . Echo 08/2018 Study Conclusions  - Left ventricle: The cavity size was normal. Systolic function was   normal. The estimated ejection fraction was in the range of 60%   to 65%. Wall motion was normal; there were no regional wall   motion abnormalities. - Mitral valve: Valve area by continuity equation (using LVOT   flow): 2.52 cm^2. - Left atrium: The atrium was severely dilated. - Right atrium: The atrium was mildly dilated. - Pulmonary arteries: Systolic pressure was within the normal   range. PA peak pressure: 34 mm Hg (S).   ASSESSMENT AND PLAN:  1.  Afib: permanent rate control and anticoagulation good Severe LAE failed [facility] Labs done by primary  [place] q 6 months   2. Hx of COVID CXR 08/21/19 NAD Received vaccine April 2021      3.  Murmur with clindamycin for dental procedures with hx of rheumatic fever. Echo done 09/01/18 no significant valve disease   4  HTN : Well controlled.  Continue current medications and low sodium Dash type diet.    5. HLD:  On statin labs with primary    Current medicines are reviewed with the patient today.  The patient Has no concerns regarding medicines.  The following changes have been made:  See above Labs/ tests ordered today include:see above  Disposition:   FU: 6 months   Signed, [name], MD  04/04/2020 8:55 AM    [facility] [place], [place], [place], [place] Phone: [phone]; Fax: [phone]

## 2020-04-04 ENCOUNTER — Ambulatory Visit (INDEPENDENT_AMBULATORY_CARE_PROVIDER_SITE_OTHER): Payer: Medicare Other | Admitting: Cardiovascular Disease

## 2020-04-04 ENCOUNTER — Encounter: Payer: Self-pay | Admitting: Cardiovascular Disease

## 2020-04-04 ENCOUNTER — Other Ambulatory Visit: Payer: Self-pay

## 2020-04-04 VITALS — BP 144/88 | HR 59 | Ht 63.0 in | Wt 139.0 lb

## 2020-04-04 DIAGNOSIS — I482 Chronic atrial fibrillation, unspecified: Secondary | ICD-10-CM

## 2020-04-04 NOTE — Patient Instructions (Addendum)

## 2020-06-10 ENCOUNTER — Telehealth: Payer: Self-pay | Admitting: Cardiovascular Disease

## 2020-06-10 NOTE — Telephone Encounter (Signed)
Pt called and would like to speak to Dr Johnsie Cancel about getting the covid booster and her flu shot.  She has some questions about them  Best number [phone]

## 2020-06-10 NOTE — Telephone Encounter (Signed)
Called patient back. Informed her that it is okay to get flu shot and covid booster around the same time, but if she feels better about spacing it out, that should be fine. Patient stated she wants to space it out.

## 2020-06-11 DIAGNOSIS — Z23 Encounter for immunization: Secondary | ICD-10-CM | POA: Diagnosis not present

## 2020-07-31 DIAGNOSIS — Z23 Encounter for immunization: Secondary | ICD-10-CM | POA: Diagnosis not present

## 2020-08-05 ENCOUNTER — Other Ambulatory Visit: Payer: Self-pay | Admitting: Cardiovascular Disease

## 2020-08-05 DIAGNOSIS — I10 Essential (primary) hypertension: Secondary | ICD-10-CM

## 2020-09-12 DIAGNOSIS — I4891 Unspecified atrial fibrillation: Secondary | ICD-10-CM | POA: Diagnosis not present

## 2020-09-12 DIAGNOSIS — Z7901 Long term (current) use of anticoagulants: Secondary | ICD-10-CM | POA: Diagnosis not present

## 2020-09-12 DIAGNOSIS — M79672 Pain in left foot: Secondary | ICD-10-CM | POA: Diagnosis not present

## 2020-09-12 DIAGNOSIS — M25476 Effusion, unspecified foot: Secondary | ICD-10-CM | POA: Diagnosis not present

## 2020-09-17 DIAGNOSIS — M109 Gout, unspecified: Secondary | ICD-10-CM | POA: Diagnosis not present

## 2020-09-23 ENCOUNTER — Other Ambulatory Visit: Payer: Self-pay | Admitting: Cardiovascular Disease

## 2020-09-23 NOTE — Telephone Encounter (Signed)
Pt's age 75, wt 21 kg, SCr 1.13, CrCl 42.78, last ov 04/04/20.

## 2020-10-01 NOTE — Progress Notes (Signed)
Cardiology Office Note   Date:  10/01/2020   ID:  Margarit, Minshall 09/24/1945, MRN [id number]  PCP:  Aretta Nip, MD  Cardiologist:  Dr. Johnsie Cancel     No chief complaint on file.     History of Present Illness: Alicia Rodgers is a 75 y.o. female who presents for atrial fib. Diagnosed on office visit 08/25/18 TTE normal EF LA 44 mm  [facility] 10/06/18 with conversion but ERAF Decision made to rate control CHADVAS 3 on Eliquis and beta blocker   She did have COVID in early Dec 2020 .  But has recovered F/U CXR NAD and had vaccine in April 2021    Middle daughter Alicia Rodgers has PCOS and would like to see me regarding CAD risk in people with premature menapause  Doing well Daughter in [place] got a place in [place] is expensive until she reaches her deductible   Past Medical History:  Diagnosis Date  . Anemia   . CERVICAL CANCER, HX OF   . Fibrocystic breast   . HYPERLIPIDEMIA   . HYPERTENSION   . Peripheral neuropathy   . RHEUMATIC FEVER, HX OF     Past Surgical History:  Procedure Laterality Date  . APPENDECTOMY    . BREAST CYST EXCISION    . BREAST EXCISIONAL BIOPSY Left    benign  . CARDIOVERSION N/A 10/06/2018   Procedure: CARDIOVERSION;  Surgeon: Josue Hector, MD;  Location: [facility] ENDOSCOPY;  Service: Cardiovascular;  Laterality: N/A;  . CESAREAN SECTION     x3  . TONSILLECTOMY    . TOTAL ABDOMINAL HYSTERECTOMY       Current Outpatient Medications  Medication Sig Dispense Refill  . atenolol (TENORMIN) 25 MG tablet TAKE 1 TABLET BY MOUTH TWICE DAILY. 180 tablet 2  . ELIQUIS 5 MG TABS tablet TAKE 1 TABLET BY MOUTH TWICE DAILY. 60 tablet 10  . esomeprazole (NEXIUM) 20 MG capsule Take 20 mg by mouth daily as needed (heartburn).    . hydrochlorothiazide (HYDRODIURIL) 12.5 MG tablet Take 12.5 mg by mouth daily.    Marland Kitchen losartan (COZAAR) 100 MG tablet Take 100 mg by mouth daily.    . Multiple Vitamins-Calcium (DAILY VITAMINS FOR WOMEN PO) Take 1 tablet by mouth  daily.    . pravastatin (PRAVACHOL) 10 MG tablet Take 10 mg by mouth daily.     No current facility-administered medications for this visit.    Allergies:   Penicillins    Social History:  The patient  reports that she has never smoked. She has never used smokeless tobacco. She reports that she does not drink alcohol and does not use drugs.   Family History:  The patient's family history includes Breast cancer in her maternal aunt and paternal aunt; Cancer in her maternal grandmother; Heart disease in her father and maternal grandfather; Stroke in her mother and paternal grandfather; Thyroid cancer in her paternal aunt.    ROS:  General:no colds or fevers currently, no weight changes Skin:no rashes or ulcers HEENT:one episode of blurred vision with rapid HR, no congestion CV:see HPI PUL:see HPI GI:no diarrhea constipation or melena, no indigestion GU:no hematuria, no dysuria MS:no joint pain, no claudication Neuro:no syncope, no lightheadedness Endo:no diabetes, no thyroid disease  Wt Readings from Last 3 Encounters:  04/04/20 63 kg  08/21/19 64.3 kg  01/18/19 64.4 kg     PHYSICAL EXAM: VS:  There were no vitals taken for this visit. , BMI There is no height  or weight on file to calculate BMI.  Affect appropriate Healthy:  appears stated age 60: normal Neck supple with no adenopathy JVP normal no bruits no thyromegaly Lungs clear with no wheezing and good diaphragmatic motion Heart:  S1/S2 1/6 SEM murmur, no rub, gallop or click PMI normal Abdomen: benighn, BS positve, no tenderness, no AAA no bruit.  No HSM or HJR Distal pulses intact with no bruits No edema Neuro non-focal Skin warm and dry No muscular weakness  EKG:    Atrial flutter rate 67 nonspecific ST changes   Recent Labs: No results found for requested labs within last 8760 hours.    Lipid Panel No results found for: CHOL, TRIG, HDL, CHOLHDL, VLDL, LDLCALC, LDLDIRECT   Other studies  Reviewed: Additional studies/ records that were reviewed today include: . Echo 08/2018 Study Conclusions  - Left ventricle: The cavity size was normal. Systolic function was   normal. The estimated ejection fraction was in the range of 60%   to 65%. Wall motion was normal; there were no regional wall   motion abnormalities. - Mitral valve: Valve area by continuity equation (using LVOT   flow): 2.52 cm^2. - Left atrium: The atrium was severely dilated. - Right atrium: The atrium was mildly dilated. - Pulmonary arteries: Systolic pressure was within the normal   range. PA peak pressure: 34 mm Hg (S).   ASSESSMENT AND PLAN:  1.  Afib: permanent rate control and anticoagulation good Severe LAE failed [facility] Labs done by primary  Alicia Rodgers q 6 months   2. Hx of COVID CXR 08/21/19 NAD Received vaccine April 2021      3.  Murmur with clindamycin for dental procedures with hx of rheumatic fever. Echo done 09/01/18 no significant valve disease   4  HTN : Well controlled.  Continue current medications and low sodium Dash type diet.    5. HLD:  On statin labs with primary    Current medicines are reviewed with the patient today.  The patient Has no concerns regarding medicines.  The following changes have been made:  See above Labs/ tests ordered today include:see above  Disposition:   FU: 6 months   Signed, Alicia Rouge, MD  10/01/2020 12:05 PM    [facility] [place], [place], [place], [place] Phone: [phone]; Fax: [phone]

## 2020-10-08 ENCOUNTER — Other Ambulatory Visit: Payer: Self-pay

## 2020-10-08 ENCOUNTER — Encounter: Payer: Self-pay | Admitting: Cardiovascular Disease

## 2020-10-08 ENCOUNTER — Ambulatory Visit (INDEPENDENT_AMBULATORY_CARE_PROVIDER_SITE_OTHER): Payer: Medicare Other | Admitting: Cardiovascular Disease

## 2020-10-08 VITALS — BP 128/80 | HR 67 | Ht 63.0 in | Wt 139.0 lb

## 2020-10-08 DIAGNOSIS — I493 Ventricular premature depolarization: Secondary | ICD-10-CM

## 2020-10-08 DIAGNOSIS — I483 Typical atrial flutter: Secondary | ICD-10-CM

## 2020-10-08 DIAGNOSIS — H04123 Dry eye syndrome of bilateral lacrimal glands: Secondary | ICD-10-CM | POA: Diagnosis not present

## 2020-10-08 DIAGNOSIS — H02839 Dermatochalasis of unspecified eye, unspecified eyelid: Secondary | ICD-10-CM | POA: Diagnosis not present

## 2020-10-08 DIAGNOSIS — Z961 Presence of intraocular lens: Secondary | ICD-10-CM | POA: Diagnosis not present

## 2020-10-08 DIAGNOSIS — Z7901 Long term (current) use of anticoagulants: Secondary | ICD-10-CM

## 2020-10-08 DIAGNOSIS — H18413 Arcus senilis, bilateral: Secondary | ICD-10-CM | POA: Diagnosis not present

## 2020-10-08 NOTE — Patient Instructions (Signed)

## 2021-01-21 DIAGNOSIS — Z1389 Encounter for screening for other disorder: Secondary | ICD-10-CM | POA: Diagnosis not present

## 2021-01-21 DIAGNOSIS — Z0000 Encounter for general adult medical examination without abnormal findings: Secondary | ICD-10-CM | POA: Diagnosis not present

## 2021-02-19 DIAGNOSIS — Z1211 Encounter for screening for malignant neoplasm of colon: Secondary | ICD-10-CM | POA: Diagnosis not present

## 2021-02-19 DIAGNOSIS — Z7901 Long term (current) use of anticoagulants: Secondary | ICD-10-CM | POA: Diagnosis not present

## 2021-02-19 DIAGNOSIS — M79671 Pain in right foot: Secondary | ICD-10-CM | POA: Diagnosis not present

## 2021-02-19 DIAGNOSIS — E7800 Pure hypercholesterolemia, unspecified: Secondary | ICD-10-CM | POA: Diagnosis not present

## 2021-02-19 DIAGNOSIS — I4891 Unspecified atrial fibrillation: Secondary | ICD-10-CM | POA: Diagnosis not present

## 2021-02-19 DIAGNOSIS — E1169 Type 2 diabetes mellitus with other specified complication: Secondary | ICD-10-CM | POA: Diagnosis not present

## 2021-02-19 DIAGNOSIS — I10 Essential (primary) hypertension: Secondary | ICD-10-CM | POA: Diagnosis not present

## 2021-02-26 DIAGNOSIS — Z1211 Encounter for screening for malignant neoplasm of colon: Secondary | ICD-10-CM | POA: Diagnosis not present

## 2021-03-03 ENCOUNTER — Other Ambulatory Visit: Payer: Self-pay

## 2021-03-03 ENCOUNTER — Encounter: Payer: Self-pay | Admitting: Podiatry

## 2021-03-03 ENCOUNTER — Ambulatory Visit (INDEPENDENT_AMBULATORY_CARE_PROVIDER_SITE_OTHER): Payer: Medicare Other

## 2021-03-03 ENCOUNTER — Ambulatory Visit (INDEPENDENT_AMBULATORY_CARE_PROVIDER_SITE_OTHER): Payer: Medicare Other | Admitting: Podiatry

## 2021-03-03 DIAGNOSIS — M778 Other enthesopathies, not elsewhere classified: Secondary | ICD-10-CM | POA: Diagnosis not present

## 2021-03-03 DIAGNOSIS — M109 Gout, unspecified: Secondary | ICD-10-CM

## 2021-03-03 DIAGNOSIS — M10071 Idiopathic gout, right ankle and foot: Secondary | ICD-10-CM

## 2021-03-03 MED ORDER — DEXAMETHASONE SODIUM PHOSPHATE 120 MG/30ML IJ SOLN: 4.0000 mg | Freq: Once | INTRAMUSCULAR | Status: DC

## 2021-03-03 NOTE — Patient Instructions (Signed)
While at your visit today you received a steroid injection in your foot or ankle to help with your pain. Along with having the steroid medication there is some "numbing" medication in the shot that you received. Due to this you may notice some numbness to the area for the next couple of hours.    The actually benefit from the steroid injection may take up to 2-7 days to see a difference. You may actually experience a small (as in 10%) INCREASE in pain in the first 24 hours---that is common. It would be best if you can ice the area today and take anti-inflammatory medications (such as Ibuprofen, Motrin, or Aleve) if you are able to take these medications. If you were prescribed another medication to help with the pain go ahead and start that medication today    Things to watch out for that you should contact us or a health care provider urgently would include: 1. Unusual (as in more than 10%) increase in pain 2. New fever > 101.5 3. New swelling or redness of the injected area.  4. Streaking of red lines around the area injected.  If you have any questions or concerns about this, please give our office a call at [phone].   Gout  Gout is painful swelling of your joints. Gout is a type of arthritis. It is caused by having too much uric acid in your body. Uric acid is a chemical that is made when your body breaks down substances called purines. If your body has too much uric acid, sharp crystals can form and build up in your joints. Thiscauses pain and swelling. Gout attacks can happen quickly and be very painful (acute gout). Over time, the attacks can affect more joints and happen more often (chronic gout). What are the causes? Too much uric acid in your blood. This can happen because: Your kidneys do not remove enough uric acid from your blood. Your body makes too much uric acid. You eat too many foods that are high in purines. These foods include organ meats, some seafood, and beer. Trauma  or stress. What increases the risk? Having a family history of gout. Being female and middle-aged. Being female and having gone through menopause. Being very overweight (obese). Drinking alcohol, especially beer. Not having enough water in the body (being dehydrated). Losing weight too quickly. Having an organ transplant. Having lead poisoning. Taking certain medicines. Having kidney disease. Having a skin condition called psoriasis. What are the signs or symptoms? An attack of acute gout usually happens in just one joint. The most common place is the big toe. Attacks often start at night. Other joints that may be affected include joints of the feet, ankle, knee, fingers, wrist, or elbow. Symptoms of an attack may include: Very bad pain. Warmth. Swelling. Stiffness. Shiny, red, or purple skin. Tenderness. The affected joint may be very painful to touch. Chills and fever. Chronic gout may cause symptoms more often. More joints may be involved. You may also have white or yellow lumps (tophi) on your hands or feet or in other areas near your joints. How is this treated? Treatment for this condition has two phases: treating an acute attack and preventing future attacks. Acute gout treatment may include: NSAIDs. Steroids. These are taken by mouth or injected into a joint. Colchicine. This medicine relieves pain and swelling. It can be given by mouth or through an IV tube. Preventive treatment may include: Taking small doses of NSAIDs or colchicine daily. Using a  medicine that reduces uric acid levels in your blood. Making changes to your diet. You may need to see a food expert (dietitian) about what to eat and drink to prevent gout. Follow these instructions at home: During a gout attack  If told, put ice on the painful area: Put ice in a plastic bag. Place a towel between your skin and the bag. Leave the ice on for 20 minutes, 2-3 times a day. Raise (elevate) the painful joint  above the level of your heart as often as you can. Rest the joint as much as possible. If the joint is in your leg, you may be given crutches. Follow instructions from your doctor about what you cannot eat or drink.  Avoiding future gout attacks Eat a low-purine diet. Avoid foods and drinks such as: Liver. Kidney. Anchovies. Asparagus. Herring. Mushrooms. Mussels. Beer. Stay at a healthy weight. If you want to lose weight, talk with your doctor. Do not lose weight too fast. Start or continue an exercise plan as told by your doctor. Eating and drinking Drink enough fluids to keep your pee (urine) pale yellow. If you drink alcohol: Limit how much you use to: 0-1 drink a day for women. 0-2 drinks a day for men. Be aware of how much alcohol is in your drink. In the [place] one drink equals one 12 oz bottle of beer (355 mL), one 5 oz glass of wine (148 mL), or one 1 oz glass of hard liquor (44 mL). General instructions Take over-the-counter and prescription medicines only as told by your doctor. Do not drive or use heavy machinery while taking prescription pain medicine. Return to your normal activities as told by your doctor. Ask your doctor what activities are safe for you. Keep all follow-up visits as told by your doctor. This is important. Contact a doctor if: You have another gout attack. You still have symptoms of a gout attack after 10 days of treatment. You have problems (side effects) because of your medicines. You have chills or a fever. You have burning pain when you pee (urinate). You have pain in your lower back or belly. Get help right away if: You have very bad pain. Your pain cannot be controlled. You cannot pee. Summary Gout is painful swelling of the joints. The most common site of pain is the big toe, but it can affect other joints. Medicines and avoiding some foods can help to prevent and treat gout attacks. This information is not intended to replace advice  given to you by your health care provider. Make sure you discuss any questions you have with your healthcare provider. Document Revised: 02/23/2018 Document Reviewed: 02/23/2018 Elsevier Patient Education  [place].

## 2021-03-05 ENCOUNTER — Other Ambulatory Visit: Payer: Self-pay | Admitting: Podiatry

## 2021-03-05 DIAGNOSIS — M109 Gout, unspecified: Secondary | ICD-10-CM

## 2021-03-05 DIAGNOSIS — M778 Other enthesopathies, not elsewhere classified: Secondary | ICD-10-CM

## 2021-03-05 NOTE — Progress Notes (Signed)
Subjective: 75 year old female presents the office today for concerns of pain in the top of her right foot which is been ongoing for about 3 weeks.  She states initially it was swollen and red causing discomfort.  She states that she was fine when she went to bed she woke up the next morning and noticed the symptoms.  Has gotten somewhat better.  Does her issues.  No recent injury or trauma.  She describes a pulling, twisting sensation. Denies any systemic complaints such as fevers, chills, nausea, vomiting. No acute changes since last appointment, and no other complaints at this time.   Objective: AAO x3, NAD DP/PT pulses palpable bilaterally, CRT less than 3 seconds There is tenderness and localized swelling and erythema of the dorsal aspect of the right foot medial portion.  This is on the area the Lisfranc joint.  There is a palpable bone spur present to the area.  There is no area of pinpoint tenderness.  Flexor and extensor tendons appear to be intact. No open lesions or pre-ulcerative lesions.  No pain with calf compression, swelling, warmth, erythema  Assessment: Capsulitis, likely gout right foot  Plan: -All treatment options discussed with the patient including all alternatives, risks, complications.  -X-rays obtained reviewed.  No evidence of acute fracture.  Dorsal spurring present.  Arthritic changes present to the rear foot. -Steroid injection performed the area of tenderness on the area the spurring.  Skin was prepped with alcohol and mixture of 0.5 cc of dexamethasone phosphate, 0.5 cc of lidocaine plain was infiltrated into the area of tenderness without complications.  Postinjection care discussed. -Patient encouraged to call the office with any questions, concerns, change in symptoms.   Trula Slade DPM

## 2021-04-25 ENCOUNTER — Encounter: Payer: Self-pay | Admitting: Podiatrist

## 2021-04-25 ENCOUNTER — Ambulatory Visit (INDEPENDENT_AMBULATORY_CARE_PROVIDER_SITE_OTHER): Payer: Medicare Other | Admitting: Podiatrist

## 2021-04-25 ENCOUNTER — Other Ambulatory Visit: Payer: Self-pay

## 2021-04-25 DIAGNOSIS — M109 Gout, unspecified: Secondary | ICD-10-CM

## 2021-04-25 MED ORDER — TRIAMCINOLONE ACETONIDE 10 MG/ML IJ SUSP
10.0000 mg | Freq: Once | INTRAMUSCULAR | Status: AC
  Administered 2021-04-25: 10 mg

## 2021-04-25 MED ORDER — COLCHICINE 0.6 MG PO TABS: 0.6000 mg | ORAL_TABLET | Freq: Every day | ORAL | 2 refills | Status: DC

## 2021-04-25 NOTE — Progress Notes (Signed)
Chief Complaint  Patient presents with   Follow-up    Right foot pain follow up. Pt states it has worsened since her last visit. Pt states she went to the beach and then her symptoms began. Pt states her foot is swollen, red and warm.      HPI: Patient is 75 y.o. female who presents today for the concerns as listed above. She was given an injection for gout in her foot at her last visit and states it helped but then the foot has gotten worse as of late.  She states it is worse today than it was when she was last seen.. she relates the foot has become red, hot and swollen and she is having pain with walking.  She relates she had a broken bone on the top of her foot and this is near the area of pain.  No recent injury or trauma.  Denies any systemic complaints such as fevers, chills, nausea, vomiting. No acute changes since last appointment, and no other complaints at this time.   Patient Active Problem List   Diagnosis Date Noted   Paresthesia 01/21/2016   PVC (premature ventricular contraction) 12/07/2011   Elevated lipids 03/29/2009   Essential hypertension 03/29/2009   CERVICAL CANCER, HX OF 03/29/2009   RHEUMATIC FEVER, HX OF 03/29/2009    Current Outpatient Medications on File Prior to Visit  Medication Sig Dispense Refill   atenolol (TENORMIN) 25 MG tablet TAKE 1 TABLET BY MOUTH TWICE DAILY. 180 tablet 2   doxycycline (VIBRAMYCIN) 100 MG capsule      ELIQUIS 5 MG TABS tablet TAKE 1 TABLET BY MOUTH TWICE DAILY. 60 tablet 10   esomeprazole (NEXIUM) 20 MG capsule Take 20 mg by mouth daily as needed (heartburn).     hydrochlorothiazide (HYDRODIURIL) 12.5 MG tablet Take 12.5 mg by mouth daily.     losartan (COZAAR) 100 MG tablet Take 100 mg by mouth daily.     MITIGARE 0.6 MG CAPS      Multiple Vitamins-Calcium (DAILY VITAMINS FOR WOMEN PO) Take 1 tablet by mouth daily.     pravastatin (PRAVACHOL) 10 MG tablet Take 10 mg by mouth daily.     Current Facility-Administered Medications on  File Prior to Visit  Medication Dose Route Frequency Provider Last Rate Last Admin   dexamethasone (DECADRON) injection 4 mg  4 mg Intra-articular Once Trula Slade, DPM        Allergies  Allergen Reactions   Other Other (See Comments)   Penicillins Swelling and Rash    Did it involve swelling of the face/tongue/throat, SOB, or low BP? No Did it involve sudden or severe rash/hives, skin peeling, or any reaction on the inside of your mouth or nose? No Did you need to seek medical attention at a hospital or doctor's office? No When did it last happen?      40 years ago If all above answers are "NO", may proceed with cephalosporin use.      Review of Systems No fevers, chills, nausea, muscle aches, no difficulty breathing, no calf pain, no chest pain or shortness of breath.   Physical Exam  GENERAL APPEARANCE: Alert, conversant. Appropriately groomed. No acute distress.   Neurovascular status intact right foot. The right foot is swollen, red and warm to the touch especially around the first ray and first metatarsal base/ medial cuneiform joint.  No ulceration or wound noted.  No interdigital maceration or entry for infection is identified, no streaking , no calf  pain is noted.      Assessment   1. Acute gout of right foot, unspecified cause      Plan  Treatment options discussed.  I recommended another steroid injection and the patient agreed.  I prepped the skin with alcohol and infiltrated '10mg'$  kenalog with marcaine plain in to the first interspace without complication.  I also called in colchicine to take once daily for a gout flare.  I also recommended she see Dr. Amil Amen for further evaluation since her gout flare came back so quickly since her last injection.  She related her uric acid level was elevated at her last dr appt.  A referral was written for her.  She will call if the injection is not helping by Monday.

## 2021-04-25 NOTE — Patient Instructions (Signed)

## 2021-05-02 ENCOUNTER — Telehealth: Payer: Self-pay | Admitting: Podiatrist

## 2021-05-02 NOTE — Telephone Encounter (Signed)
Patient called the office concerned that the rheumatologist has not called her to set up an appointment.

## 2021-05-05 ENCOUNTER — Other Ambulatory Visit: Payer: Self-pay | Admitting: Podiatrist

## 2021-05-05 DIAGNOSIS — M109 Gout, unspecified: Secondary | ICD-10-CM

## 2021-05-05 NOTE — Telephone Encounter (Signed)
I called  Alicia Rodgers  to let her know another referral request for rheumatology has been sent.

## 2021-05-06 NOTE — Addendum Note (Signed)
Addended by: Sindy Messing A on: 05/06/2021 12:44 PM   Modules accepted: Orders

## 2021-05-07 NOTE — Progress Notes (Signed)
Cardiology Office Note   Date:  05/09/2021   ID:  Linsi, Humann 1945/10/13, MRN [id number]  PCP:  Aretta Nip, MD  Cardiologist:  Dr. Johnsie Cancel     No chief complaint on file.     History of Present Illness: Alicia Rodgers is a 75 y.o. female who presents for atrial fib. Diagnosed on office visit 08/25/18 TTE normal EF LA 44 mm  [facility] 10/06/18 with conversion but ERAF Decision made to rate control CHADVAS 3 on Eliquis and beta blocker    She did have COVID in early Dec 2020 .  But has recovered F/U CXR NAD and had vaccine in April 2021    Middle daughter Donella Stade has PCOS and would like to see me regarding CAD risk in people with premature menapause  Doing well Daughter in [place] got a place in [place] is expensive until she reaches her deductible   No cardiac issues Having gout in her right foot just finished course of colchicine  Past Medical History:  Diagnosis Date   Anemia    CERVICAL CANCER, HX OF    Fibrocystic breast    HYPERLIPIDEMIA    HYPERTENSION    Peripheral neuropathy    RHEUMATIC FEVER, HX OF     Past Surgical History:  Procedure Laterality Date   APPENDECTOMY     BREAST CYST EXCISION     BREAST EXCISIONAL BIOPSY Left    benign   CARDIOVERSION N/A 10/06/2018   Procedure: CARDIOVERSION;  Surgeon: Josue Hector, MD;  Location: [facility];  Service: Cardiovascular;  Laterality: N/A;   CESAREAN SECTION     x3   TONSILLECTOMY     TOTAL ABDOMINAL HYSTERECTOMY       Current Outpatient Medications  Medication Sig Dispense Refill   atenolol (TENORMIN) 25 MG tablet TAKE 1 TABLET BY MOUTH TWICE DAILY. 180 tablet 2   colchicine 0.6 MG tablet Take 1 tablet (0.6 mg total) by mouth daily. Take for gout flare. 30 tablet 2   ELIQUIS 5 MG TABS tablet TAKE 1 TABLET BY MOUTH TWICE DAILY. 60 tablet 10   esomeprazole (NEXIUM) 20 MG capsule Take 20 mg by mouth daily as needed (heartburn).     hydrochlorothiazide (HYDRODIURIL) 12.5 MG tablet Take  12.5 mg by mouth daily.     losartan (COZAAR) 100 MG tablet Take 100 mg by mouth daily.     MITIGARE 0.6 MG CAPS      Multiple Vitamins-Calcium (DAILY VITAMINS FOR WOMEN PO) Take 1 tablet by mouth daily.     pravastatin (PRAVACHOL) 10 MG tablet Take 10 mg by mouth daily.     doxycycline (VIBRAMYCIN) 100 MG capsule  (Patient not taking: Reported on 05/09/2021)     Current Facility-Administered Medications  Medication Dose Route Frequency Provider Last Rate Last Admin   dexamethasone (DECADRON) injection 4 mg  4 mg Intra-articular Once Trula Slade, DPM        Allergies:   Other and Penicillins    Social History:  The patient  reports that she has never smoked. She has never used smokeless tobacco. She reports that she does not drink alcohol and does not use drugs.   Family History:  The patient's family history includes Breast cancer in her maternal aunt and paternal aunt; Cancer in her maternal grandmother; Heart disease in her father and maternal grandfather; Stroke in her mother and paternal grandfather; Thyroid cancer in her paternal aunt.    ROS:  General:no  colds or fevers currently, no weight changes Skin:no rashes or ulcers HEENT:one episode of blurred vision with rapid HR, no congestion CV:see HPI PUL:see HPI GI:no diarrhea constipation or melena, no indigestion GU:no hematuria, no dysuria MS:no joint pain, no claudication Neuro:no syncope, no lightheadedness Endo:no diabetes, no thyroid disease  Wt Readings from Last 3 Encounters:  05/09/21 62.4 kg  10/08/20 63 kg  04/04/20 63 kg     PHYSICAL EXAM: VS:  BP 130/60   Pulse (!) 57   Ht 5\' 3"  (1.6 m)   Wt 62.4 kg   SpO2 97%   BMI 24.37 kg/m  , BMI Body mass index is 24.37 kg/m.  Affect appropriate Healthy:  appears stated age 64: normal Neck supple with no adenopathy JVP normal no bruits no thyromegaly Lungs clear with no wheezing and good diaphragmatic motion Heart:  S1/S2 1/6 SEM murmur, no rub,  gallop or click PMI normal Abdomen: benighn, BS positve, no tenderness, no AAA no bruit.  No HSM or HJR Distal pulses intact with no bruits No edema Neuro non-focal Skin warm and dry No muscular weakness  EKG:    Atrial flutter rate 67 nonspecific ST changes   Recent Labs: No results found for requested labs within last 8760 hours.    Lipid Panel No results found for: CHOL, TRIG, HDL, CHOLHDL, VLDL, LDLCALC, LDLDIRECT   Other studies Reviewed: Additional studies/ records that were reviewed today include: . Echo 08/2018 Study Conclusions   - Left ventricle: The cavity size was normal. Systolic function was   normal. The estimated ejection fraction was in the range of 60%   to 65%. Wall motion was normal; there were no regional wall   motion abnormalities. - Mitral valve: Valve area by continuity equation (using LVOT   flow): 2.52 cm^2. - Left atrium: The atrium was severely dilated. - Right atrium: The atrium was mildly dilated. - Pulmonary arteries: Systolic pressure was within the normal   range. PA peak pressure: 34 mm Hg (S).    ASSESSMENT AND PLAN:  1.  Afib: permanent rate control and anticoagulation good Severe LAE failed [facility] Labs done by primary  [place] Rankin q 6 months   2. Hx of COVID CXR 08/21/19 NAD Received vaccine April 2021      3.  Murmur with clindamycin for dental procedures with hx of rheumatic fever. Echo done 09/01/18 no significant valve disease will update   4  HTN : Well controlled.  Continue current medications and low sodium Dash type diet.    5. HLD:  On statin labs with primary Discussed utility of calcium score  6. Gout:  post rx steroid injection and colchicine has f/u with rheumatology    Current medicines are reviewed with the patient today.  The patient Has no concerns regarding medicines.  The following changes have been made:  See above Labs/ tests ordered today include:  Echo  and Coronary Calcium Score   Disposition:   FU: 6  months   Signed, [name], MD  05/09/2021 4:41 PM    [facility] [place], [place], [place], [place] Phone: [phone]; Fax: [phone]

## 2021-05-09 ENCOUNTER — Ambulatory Visit (INDEPENDENT_AMBULATORY_CARE_PROVIDER_SITE_OTHER): Payer: Medicare Other | Admitting: Cardiovascular Disease

## 2021-05-09 ENCOUNTER — Other Ambulatory Visit: Payer: Self-pay

## 2021-05-09 ENCOUNTER — Encounter: Payer: Self-pay | Admitting: Cardiovascular Disease

## 2021-05-09 VITALS — BP 130/60 | HR 57 | Ht 63.0 in | Wt 137.6 lb

## 2021-05-09 DIAGNOSIS — I10 Essential (primary) hypertension: Secondary | ICD-10-CM | POA: Diagnosis not present

## 2021-05-09 DIAGNOSIS — I482 Chronic atrial fibrillation, unspecified: Secondary | ICD-10-CM

## 2021-05-09 DIAGNOSIS — Z7901 Long term (current) use of anticoagulants: Secondary | ICD-10-CM

## 2021-05-09 NOTE — Patient Instructions (Signed)
Medication Instructions:  *If you need a refill on your cardiac medications before your next appointment, please call your pharmacy*   Lab Work: If you have labs (blood work) drawn today and your tests are completely normal, you will receive your results only by: MyChart Message (if you have MyChart) OR A paper copy in the mail If you have any lab test that is abnormal or we need to change your treatment, we will call you to review the results.  Follow-Up: At [facility], you and your health needs are our priority.  As part of our continuing mission to provide you with exceptional heart care, we have created designated Provider Care Teams.  These Care Teams include your primary Cardiologist (physician) and Advanced Practice Providers (APPs -  Physician Assistants and Nurse Practitioners) who all work together to provide you with the care you need, when you need it.  We recommend signing up for the patient portal called "MyChart".  Sign up information is provided on this After Visit Summary.  MyChart is used to connect with patients for Virtual Visits (Telemedicine).  Patients are able to view lab/test results, encounter notes, upcoming appointments, etc.  Non-urgent messages can be sent to your provider as well.   To learn more about what you can do with MyChart, go to [email or web address].    Your next appointment:   6 month(s)  The format for your next appointment:   In Person  Provider:   You may see Peter Nishan, MD or one of the following Advanced Practice Providers on your designated Care Team:   Laura Ingold, NP  

## 2021-06-05 NOTE — Progress Notes (Signed)
Office Visit Note  Patient: Alicia Rodgers             Date of Birth: Apr 26, 1946           MRN: [id number]             PCP: Aretta Nip, MD Referring: Bronson Ing, DPM Visit Date: 06/06/2021 Occupation: Retired Optometrist  Subjective:   History of Present Illness: Alicia Rodgers is a 75 y.o. female here for gouty arthritis of the right foot with recurrent episodes treated with local steroid injection and most recent with daily colchicine. This started with the first time about 2 years ago and now 4 total episodes. This year was worse with one episode in June and another in September, with the last time taking a month to resolve. She has had local steroid injections to the 1st MTP joint usually with good relief but the last time slower to improve. Xrays from earlier this year demonstrated osteoarthritis change in the midfoot, with past traumatic injury to the site, there was no erosive disease at the MTPs. She is on eliquis anticoagulation for permanent Afib. She previously took dyazide but not on any current diuretic treatments.  Labs reviewed 08/2019 eGFR 48  Activities of Daily Living:  Patient reports morning stiffness for 0  none .   Patient Reports nocturnal pain.  Difficulty dressing/grooming: Denies Difficulty climbing stairs: Denies Difficulty getting out of chair: Denies Difficulty using hands for taps, buttons, cutlery, and/or writing: Denies  Review of Systems  Constitutional:  Positive for fatigue.  HENT:  Positive for mouth dryness.   Eyes:  Negative for dryness.  Respiratory:  Negative for shortness of breath.   Cardiovascular:  Negative for swelling in legs/feet.  Gastrointestinal:  Negative for constipation.  Endocrine: Negative for excessive thirst.  Genitourinary:  Negative for difficulty urinating.  Musculoskeletal:  Negative for joint pain and joint pain.  Skin:  Negative for rash.  Allergic/Immunologic: Negative for susceptible to infections.   Neurological:  Negative for numbness.  Hematological:  Negative for bruising/bleeding tendency.  Psychiatric/Behavioral:  Negative for sleep disturbance.    PMFS History:  Patient Active Problem List   Diagnosis Date Noted   Podagra 06/06/2021   CKD (chronic kidney disease) 06/06/2021   Post-traumatic osteoarthritis, right ankle and foot 06/06/2021   Paresthesia 01/21/2016   PVC (premature ventricular contraction) 12/07/2011   Elevated lipids 03/29/2009   Essential hypertension 03/29/2009   CERVICAL CANCER, HX OF 03/29/2009   RHEUMATIC FEVER, HX OF 03/29/2009    Past Medical History:  Diagnosis Date   Anemia    Atrial fibrillation (HCC)    CERVICAL CANCER, HX OF    Fibrocystic breast    HYPERLIPIDEMIA    HYPERTENSION    Peripheral neuropathy    RHEUMATIC FEVER, HX OF     Family History  Problem Relation Age of Onset   Stroke Mother    Heart disease Father    Hypertension Brother    Hyperlipidemia Brother    Cancer Maternal Grandmother        Unsure   Heart disease Maternal Grandfather    Stroke Paternal Grandfather    Breast cancer Maternal Aunt    Breast cancer Paternal Aunt    Thyroid cancer Paternal Aunt    Past Surgical History:  Procedure Laterality Date   APPENDECTOMY     BREAST CYST EXCISION     BREAST EXCISIONAL BIOPSY Left    benign   CARDIOVERSION N/A 10/06/2018  Procedure: CARDIOVERSION;  Surgeon: Josue Hector, MD;  Location: [facility] ENDOSCOPY;  Service: Cardiovascular;  Laterality: N/A;   CESAREAN SECTION     x3   TONSILLECTOMY     TOTAL ABDOMINAL HYSTERECTOMY     Social History   Social History Narrative   Lives with her daughter and her family.   Right-handed.   1 cup caffeine daily.   Immunization History  Administered Date(s) Administered   PFIZER(Purple Top)SARS-COV-2 Vaccination 10/27/2019, 11/22/2019     Objective: Vital Signs: BP 138/73 (BP Location: Right Arm, Patient Position: Sitting, Cuff Size: Small)   Pulse 67   Resp 12    Ht 5' 2.5" (1.588 m)   Wt 137 lb 9.6 oz (62.4 kg)   BMI 24.77 kg/m    Physical Exam Eyes:     Conjunctiva/sclera: Conjunctivae normal.  Cardiovascular:     Rate and Rhythm: Normal rate. Rhythm irregular.  Pulmonary:     Effort: Pulmonary effort is normal.     Breath sounds: Normal breath sounds.  Musculoskeletal:     Right lower leg: No edema.     Left lower leg: No edema.  Skin:    General: Skin is warm and dry.  Neurological:     Mental Status: She is alert.  Psychiatric:        Mood and Affect: Mood normal.     Musculoskeletal Exam:  Elbows full ROM no tenderness or swelling Wrists full ROM no tenderness or swelling Fingers full ROM no tenderness or swelling, PIP joint enlargement Knees full ROM no tenderness or swelling Ankles full ROM no tenderness or swelling, large posterior calcaneal spurring Right dorsal midfoot nodule, 1st MTP enlargement with mild erythema and enlarged compared to left foot, left 1st MTP normal    Investigation: No additional findings.  Imaging: No results found.  Recent Labs: Lab Results  Component Value Date   WBC 8.3 10/05/2018   HGB 13.4 10/05/2018   PLT 234 10/05/2018   NA 140 08/21/2019   K 3.9 08/21/2019   CL 101 08/21/2019   CO2 24 08/21/2019   GLUCOSE 165 (H) 08/21/2019   BUN 21 08/21/2019   CREATININE 1.13 (H) 08/21/2019   CALCIUM 10.0 08/21/2019   GFRAA 55 (L) 08/21/2019    Speciality Comments: No specialty comments available.  Procedures:  No procedures performed Allergies: Other and Penicillins   Assessment / Plan:     Visit Diagnoses: Podagra - Plan: Uric acid  She had increased frequency of gouty episodes with for now in the past year.  No evidence of erosive disease on imaging but this frequency of attacks would recommend for urate lowering therapy to prevent accumulation of damage.  Checking uric acid level and plan to start allopurinol 100 mg daily if this is above goal.  Chronic kidney disease,  unspecified CKD stage - Plan: COMPLETE METABOLIC PANEL WITH GFR, Urinalysis, Routine w reflex microscopic  Decreased renal function likely contributing to the increased retention of uric acid and a cause for gout to become worse more recently.  We will also have to titrate allopurinol dose more slowly to mitigate risk of hypersensitivity reaction.  Checking metabolic panel today.  Also check a urinalysis with reflex.  Post-traumatic osteoarthritis, right ankle and foot  Joint changes more advanced in the right foot from previous injury also appears to be more frequent site for developing podagra symptoms which is typical.  Orders: Orders Placed This Encounter  Procedures   Uric acid   COMPLETE METABOLIC PANEL WITH  GFR   Urinalysis, Routine w reflex microscopic    No orders of the defined types were placed in this encounter.    Follow-Up Instructions: No follow-ups on file.   Collier Salina, MD  Note - This record has been created using [employer].  Chart creation errors have been sought, but may not always  have been located. Such creation errors do not reflect on  the standard of medical care.

## 2021-06-06 ENCOUNTER — Other Ambulatory Visit: Payer: Self-pay

## 2021-06-06 ENCOUNTER — Ambulatory Visit (INDEPENDENT_AMBULATORY_CARE_PROVIDER_SITE_OTHER): Payer: Medicare Other | Admitting: Internal Medicine

## 2021-06-06 ENCOUNTER — Encounter: Payer: Self-pay | Admitting: Internal Medicine

## 2021-06-06 VITALS — BP 138/73 | HR 67 | Resp 12 | Ht 62.5 in | Wt 137.6 lb

## 2021-06-06 DIAGNOSIS — N189 Chronic kidney disease, unspecified: Secondary | ICD-10-CM | POA: Diagnosis not present

## 2021-06-06 DIAGNOSIS — M19171 Post-traumatic osteoarthritis, right ankle and foot: Secondary | ICD-10-CM | POA: Diagnosis not present

## 2021-06-06 DIAGNOSIS — M109 Gout, unspecified: Secondary | ICD-10-CM | POA: Diagnosis not present

## 2021-06-06 NOTE — Patient Instructions (Signed)
Allopurinol Tablets What is this medication? ALLOPURINOL (al oh PURE i nole) treats gout or kidney stones. It may also be used to prevent high uric acid levels after chemotherapy. It works by decreasing uric acid levels in your body. This medicine may be used for other purposes; ask your health care provider or pharmacist if you have questions. COMMON BRAND NAME(S): Zyloprim What should I tell my care team before I take this medication? They need to know if you have any of these conditions: Kidney disease Liver disease An unusual or allergic reaction to allopurinol, other medications, foods, dyes, or preservatives Pregnant or trying to get pregnant Breast-feeding How should I use this medication? Take this medication by mouth with a glass of water. Follow the directions on the prescription label. If this medication upsets your stomach, take it with food or milk. Take your doses at regular intervals. Do not take your medication more often than directed. Talk to your care team regarding the use of this medication in children. Special care may be needed. While this medication may be prescribed for children as young as 6 years for selected conditions, precautions do apply. Overdosage: If you think you have taken too much of this medicine contact a poison control center or emergency room at once. NOTE: This medicine is only for you. Do not share this medicine with others. What if I miss a dose? If you miss a dose, take it as soon as you can. If it is almost time for your next dose, take only that dose. Do not take double or extra doses. What may interact with this medication? Do not take this medication with the following: Didanosine, ddI This medication may also interact with the following: Certain antibiotics like amoxicillin, ampicillin Certain medications for cancer Certain medications for immunosuppression like azathioprine, cyclosporine,  mercaptopurine Chlorpropamide Probenecid Sulfinpyrazone Thiazide diuretics, like hydrochlorothiazide Warfarin This list may not describe all possible interactions. Give your health care provider a list of all the medicines, herbs, non-prescription drugs, or dietary supplements you use. Also tell them if you smoke, drink alcohol, or use illegal drugs. Some items may interact with your medicine. What should I watch for while using this medication? Visit your care team for regular checks on your progress. If you are taking this medication to treat gout, you may not have less frequent attacks at first. Keep taking your medication regularly and the attacks should get better within 2 to 6 weeks. Drink plenty of water (10 to 12 full glasses a day) while you are taking this medication. This will help to reduce stomach upset and reduce the risk of getting gout or kidney stones. Call your care team at once if you get a skin rash together with chills, fever, sore throat, or nausea and vomiting, if you have blood in your urine, or difficulty passing urine. This medication may cause serious skin reactions. They can happen weeks to months after starting the medication. Contact your care team right away if you notice fevers or flu-like symptoms with a rash. The rash may be red or purple and then turn into blisters or peeling of the skin. Or, you might notice a red rash with swelling of the face, lips or lymph nodes in your neck or under your arms. Do not take vitamin C without asking your care team. Too much vitamin C can increase the chance of getting kidney stones. You may get drowsy or dizzy. Do not drive, use machinery, or do anything that needs mental alertness   until you know how this medication affects you. Do not stand or sit up quickly, especially if you are an older patient. This reduces the risk of dizzy or fainting spells. Alcohol can make you more drowsy and dizzy. Alcohol can also increase the chance of  stomach problems and increase the amount of uric acid in your blood. Avoid alcoholic drinks. What side effects may I notice from receiving this medication? Side effects that you should report to your care team as soon as possible: Allergic reactions-skin rash, itching, hives, swelling of the face, lips, tongue, or throat Kidney injury-decrease in the amount of urine, swelling of the ankles, hands, or feet Liver injury-right upper belly pain, loss of appetite, nausea, light-colored stool, dark yellow or brown urine, yellowing skin or eyes, unusual weakness or fatigue Rash, fever, and swollen lymph nodes Redness, blistering, peeling, or loosening of the skin, including inside the mouth Side effects that usually do not require medical attention (report to your care team if they continue or are bothersome): Diarrhea Drowsiness Nausea Vomiting This list may not describe all possible side effects. Call your doctor for medical advice about side effects. You may report side effects to FDA at 1-800-FDA-1088. Where should I keep my medication? Keep out of the reach of children and pets. Store at room temperature between 15 and 25 degrees C (59 and 77 degrees F). Protect from light and moisture. Throw away any unused medication after the expiration date. NOTE: This sheet is a summary. It may not cover all possible information. If you have questions about this medicine, talk to your doctor, pharmacist, or health care provider.  2022 Elsevier/Gold Standard (2020-08-27 15:06:06)

## 2021-06-07 LAB — COMPLETE METABOLIC PANEL WITH GFR
AG Ratio: 1.4 (calc) (ref 1.0–2.5)
ALT: 15 U/L (ref 6–29)
AST: 15 U/L (ref 10–35)
Albumin: 4 g/dL (ref 3.6–5.1)
Alkaline phosphatase (APISO): 66 U/L (ref 37–153)
BUN/Creatinine Ratio: 18 (calc) (ref 6–22)
BUN: 23 mg/dL (ref 7–25)
CO2: 25 mmol/L (ref 20–32)
Calcium: 10.1 mg/dL (ref 8.6–10.4)
Chloride: 104 mmol/L (ref 98–110)
Creat: 1.28 mg/dL — ABNORMAL HIGH (ref 0.60–1.00)
Globulin: 2.8 g/dL (calc) (ref 1.9–3.7)
Glucose, Bld: 150 mg/dL — ABNORMAL HIGH (ref 65–99)
Potassium: 3.8 mmol/L (ref 3.5–5.3)
Sodium: 141 mmol/L (ref 135–146)
Total Bilirubin: 0.7 mg/dL (ref 0.2–1.2)
Total Protein: 6.8 g/dL (ref 6.1–8.1)
eGFR: 43 mL/min/{1.73_m2} — ABNORMAL LOW (ref >=60)

## 2021-06-07 LAB — URINALYSIS, ROUTINE W REFLEX MICROSCOPIC
Bilirubin Urine: NEGATIVE
Glucose, UA: NEGATIVE
Hgb urine dipstick: NEGATIVE
Ketones, ur: NEGATIVE
Leukocytes,Ua: NEGATIVE
Nitrite: NEGATIVE
Protein, ur: NEGATIVE
Specific Gravity, Urine: 1.014 (ref 1.001–1.035)
pH: 6 (ref 5.0–8.0)

## 2021-06-07 LAB — URIC ACID: Uric Acid, Serum: 7.7 mg/dL — ABNORMAL HIGH (ref 2.5–7.0)

## 2021-06-09 MED ORDER — ALLOPURINOL 100 MG PO TABS: 100.0000 mg | ORAL_TABLET | Freq: Every day | ORAL | 1 refills | Status: DC

## 2021-06-09 NOTE — Progress Notes (Signed)
Uric acid is 7.7 this is above recommended goal of 6.0 for gout control she should start allopurinol 100 mg 1 tablets daily, Rx sent to pharmacy. Urine study is normal and kidney function is similar as her previous ones, estimated GFR of 43.

## 2021-06-18 DIAGNOSIS — Z7901 Long term (current) use of anticoagulants: Secondary | ICD-10-CM | POA: Diagnosis not present

## 2021-06-18 DIAGNOSIS — D126 Benign neoplasm of colon, unspecified: Secondary | ICD-10-CM | POA: Diagnosis not present

## 2021-06-18 DIAGNOSIS — R195 Other fecal abnormalities: Secondary | ICD-10-CM | POA: Diagnosis not present

## 2021-06-19 ENCOUNTER — Telehealth: Payer: Self-pay | Admitting: *Deleted

## 2021-06-19 NOTE — Telephone Encounter (Signed)
   [facility] Pre-operative Risk Assessment    Patient Name: Alicia Rodgers  DOB: 08-11-1946 MRN: [id number]  HEARTCARE STAFF:  - IMPORTANT!!!!!! Under Visit Info/Reason for Call, type in Other and utilize the format Clearance MM/DD/YY or Clearance TBD. Do not use dashes or single digits. - Please review there is not already an duplicate clearance open for this procedure. - If request is for dental extraction, please clarify the # of teeth to be extracted. - If the patient is currently at the dentist's office, call Pre-Op Callback Staff (MA/nurse) to input urgent request.  - If the patient is not currently in the dentist office, please route to the Pre-Op pool.  Request for surgical clearance:  What type of surgery is being performed? Colonoscopy  When is this surgery scheduled? 10/01/21  What type of clearance is required (medical clearance vs. Pharmacy clearance to hold med vs. Both)? Both  Are there any medications that need to be held prior to surgery and how long? Eliquis  Practice name and name of physician performing surgery? [facility], Dr Therisa Doyne  What is the office phone number? [phone]   7.   What is the office fax number? [phone]  8.   Anesthesia type (None, local, MAC, general) ? Propofol   Aryonna Gunnerson L 06/19/2021, 3:52 PM  _________________________________________________________________   (provider comments below)

## 2021-06-24 DIAGNOSIS — E1169 Type 2 diabetes mellitus with other specified complication: Secondary | ICD-10-CM | POA: Insufficient documentation

## 2021-06-24 NOTE — Telephone Encounter (Signed)
Patient with diagnosis of afib on Eliquis for anticoagulation.    Procedure: colonoscopy Date of procedure: 10/01/21  CHA2DS2-VASc Score = 5  This indicates a 7.2% annual risk of stroke. The patient's score is based upon: CHF History: 0 HTN History: 1 Diabetes History: 1 Stroke History: 0 Vascular Disease History: 0 Age Score: 2 Gender Score: 1   CrCl 80mL/min Platelet count 234K (not checked since 2020)  Per office protocol, patient can hold Eliquis for 1-2 days prior to procedure.

## 2021-06-24 NOTE — Telephone Encounter (Signed)
   Name: Alicia Rodgers  DOB: [birth year]  MRN: [id number]   Primary Cardiologist: [name], MD  Chart reviewed as part of pre-operative protocol coverage. Patient was contacted 06/24/2021 in reference to pre-operative risk assessment for pending surgery as outlined below.  Alicia Rodgers was last seen on 05/09/2021 by Dr. Johnsie Cancel.  Since that day, Alicia Rodgers has done well without exertional chest pain or worsening dyspnea.  Therefore, based on ACC/AHA guidelines, the patient would be at acceptable risk for the planned procedure without further cardiovascular testing.   Patient may hold Eliquis for 1 to 2 days prior to the procedure and restart as soon as possible afterward at the surgeon's discretion.  The patient was advised that if she develops new symptoms prior to surgery to contact our office to arrange for a follow-up visit, and she verbalized understanding.  I will route this recommendation to the requesting party via Epic fax function and remove from pre-op pool. Please call with questions.  [place], [place] 06/24/2021, 11:19 AM

## 2021-06-28 DIAGNOSIS — J101 Influenza due to other identified influenza virus with other respiratory manifestations: Secondary | ICD-10-CM | POA: Diagnosis not present

## 2021-06-28 DIAGNOSIS — R059 Cough, unspecified: Secondary | ICD-10-CM | POA: Diagnosis not present

## 2021-06-28 DIAGNOSIS — Z03818 Encounter for observation for suspected exposure to other biological agents ruled out: Secondary | ICD-10-CM | POA: Diagnosis not present

## 2021-06-28 DIAGNOSIS — B349 Viral infection, unspecified: Secondary | ICD-10-CM | POA: Diagnosis not present

## 2021-07-03 ENCOUNTER — Other Ambulatory Visit: Payer: Self-pay | Admitting: Cardiovascular Disease

## 2021-07-03 DIAGNOSIS — I10 Essential (primary) hypertension: Secondary | ICD-10-CM

## 2021-07-07 DIAGNOSIS — M25572 Pain in left ankle and joints of left foot: Secondary | ICD-10-CM | POA: Diagnosis not present

## 2021-07-07 DIAGNOSIS — M109 Gout, unspecified: Secondary | ICD-10-CM | POA: Diagnosis not present

## 2021-07-07 DIAGNOSIS — M79672 Pain in left foot: Secondary | ICD-10-CM | POA: Diagnosis not present

## 2021-07-14 NOTE — Progress Notes (Signed)
Office Visit Note  Patient: Alicia Rodgers             Date of Birth: 09/01/1945           MRN: [id number]             PCP: Aretta Nip, MD Referring: Aretta Nip, MD Visit Date: 07/15/2021   Subjective:  Gout (Flare x 10 days ago, left foot)   History of Present Illness: Alicia Rodgers is a 75 y.o. female here for follow up for gout 1 month follow up after starting allopurinol 100 mg PO daily. Uric acid was 7.7 and slow dose titration started due to CKD. She did not notice any trouble starting the allopurinol. She suffered a flare up of pain and swelling in the left foot starting in the forefoot eventually extending to the whole foot below the ankle within 2-3 days. She saw the ED and was prescribed mitigare and took as directed total of 3 tablets after an hour and experienced 12 hours of severe watery diarrhea afterwards but foot symptoms have improved. She still has mild swelling left, and feels partial numbness sensation in the forefoot.   Previous HPI 06/06/21 Alicia Rodgers is a 75 y.o. female here for gouty arthritis of the right foot with recurrent episodes treated with local steroid injection and most recent with daily colchicine. This started with the first time about 2 years ago and now 4 total episodes. This year was worse with one episode in June and another in September, with the last time taking a month to resolve. She has had local steroid injections to the 1st MTP joint usually with good relief but the last time slower to improve. Xrays from earlier this year demonstrated osteoarthritis change in the midfoot, with past traumatic injury to the site, there was no erosive disease at the MTPs. She is on eliquis anticoagulation for permanent Afib. She previously took dyazide but not on any current diuretic treatments.   Labs reviewed 08/2019 eGFR 48   Review of Systems  Constitutional:  Negative for fatigue.  HENT:  Negative for mouth dryness.   Eyes:  Negative for  dryness.  Respiratory:  Positive for shortness of breath.   Cardiovascular:  Positive for swelling in legs/feet.  Gastrointestinal:  Negative for diarrhea.  Endocrine: Positive for cold intolerance.  Genitourinary:  Negative for difficulty urinating.  Musculoskeletal:  Positive for joint pain, joint pain, joint swelling and morning stiffness.  Skin:  Positive for redness.  Allergic/Immunologic: Negative for susceptible to infections.  Neurological:  Negative for numbness.  Hematological:  Negative for bruising/bleeding tendency.  Psychiatric/Behavioral:  Negative for sleep disturbance.    PMFS History:  Patient Active Problem List   Diagnosis Date Noted   Type 2 diabetes mellitus with other specified complication ([place]) 09/32/3557   Podagra 06/06/2021   CKD (chronic kidney disease) 06/06/2021   Post-traumatic osteoarthritis, right ankle and foot 06/06/2021   Paresthesia 01/21/2016   PVC (premature ventricular contraction) 12/07/2011   Elevated lipids 03/29/2009   Essential hypertension 03/29/2009   CERVICAL CANCER, HX OF 03/29/2009   RHEUMATIC FEVER, HX OF 03/29/2009    Past Medical History:  Diagnosis Date   Anemia    Atrial fibrillation (HCC)    CERVICAL CANCER, HX OF    Fibrocystic breast    HYPERLIPIDEMIA    HYPERTENSION    Peripheral neuropathy    RHEUMATIC FEVER, HX OF     Family History  Problem Relation Age of  Onset   Stroke Mother    Heart disease Father    Hypertension Brother    Hyperlipidemia Brother    Cancer Maternal Grandmother        Unsure   Heart disease Maternal Grandfather    Stroke Paternal Grandfather    Breast cancer Maternal Aunt    Breast cancer Paternal Aunt    Thyroid cancer Paternal Aunt    Past Surgical History:  Procedure Laterality Date   APPENDECTOMY     BREAST CYST EXCISION     BREAST EXCISIONAL BIOPSY Left    benign   CARDIOVERSION N/A 10/06/2018   Procedure: CARDIOVERSION;  Surgeon: Josue Hector, MD;  Location: [facility]  ENDOSCOPY;  Service: Cardiovascular;  Laterality: N/A;   CESAREAN SECTION     x3   TONSILLECTOMY     TOTAL ABDOMINAL HYSTERECTOMY     Social History   Social History Narrative   Lives with her daughter and her family.   Right-handed.   1 cup caffeine daily.   Immunization History  Administered Date(s) Administered   PFIZER(Purple Top)SARS-COV-2 Vaccination 10/27/2019, 11/22/2019     Objective: Vital Signs: BP (!) 157/64 (BP Location: Right Arm, Patient Position: Sitting, Cuff Size: Normal)   Pulse 66   Resp 16   Ht 5' 2.5" (1.588 m)   Wt 136 lb (61.7 kg)   BMI 24.48 kg/m    Physical Exam Skin:    General: Skin is warm and dry.     Findings: No rash.  Neurological:     Mental Status: She is alert.  Psychiatric:        Mood and Affect: Mood normal.     Musculoskeletal Exam:  Elbows full ROM no tenderness or swelling Wrists full ROM no tenderness or swelling Fingers full ROM no tenderness or swelling Knees full ROM no tenderness or swelling Ankles full ROM no tenderness or swelling Left foot swelling on dorsum of midfoot extending to MTPs, pitting edema, no erythema or warmth no tenderness to palpation   Investigation: No additional findings.  Imaging: No results found.  Recent Labs: Lab Results  Component Value Date   WBC 8.3 10/05/2018   HGB 13.4 10/05/2018   PLT 234 10/05/2018   NA 141 06/06/2021   K 3.8 06/06/2021   CL 104 06/06/2021   CO2 25 06/06/2021   GLUCOSE 150 (H) 06/06/2021   BUN 23 06/06/2021   CREATININE 1.28 (H) 06/06/2021   BILITOT 0.7 06/06/2021   AST 15 06/06/2021   ALT 15 06/06/2021   PROT 6.8 06/06/2021   CALCIUM 10.1 06/06/2021   GFRAA 55 (L) 08/21/2019    Speciality Comments: No specialty comments available.  Procedures:  No procedures performed Allergies: Ciprofloxacin, Other, and Penicillins   Assessment / Plan:     Visit Diagnoses: Podagra - Plan: Uric acid  Repeat inflammatory episode also sounds very consistent  with gouty arthritis.  We will recheck uric acid level today on the allopurinol.  If this remains above goal would increase to 200 mg daily.  Discussed use of as needed colchicine if symptoms return possibly just to doses during initial day to avoid the severe diarrhea issue.  Also provided printed information on low purine diet to review.  Stage 3 chronic kidney disease, unspecified whether stage 3a or 3b CKD ([place])  Estimated GFR from October labs 43 discussed continuing slow titration of allopurinol to minimize risk of severe adverse reactions.   Orders: Orders Placed This Encounter  Procedures   Uric acid  No orders of the defined types were placed in this encounter.    Follow-Up Instructions: Return in about 3 months (around 10/14/2021) for Gout allopurinol f/u 41mo.   CCollier Salina MD  Note - This record has been created using [employer]  Chart creation errors have been sought, but may not always  have been located. Such creation errors do not reflect on  the standard of medical care.

## 2021-07-15 ENCOUNTER — Ambulatory Visit (INDEPENDENT_AMBULATORY_CARE_PROVIDER_SITE_OTHER): Payer: Medicare Other | Admitting: Internal Medicine

## 2021-07-15 ENCOUNTER — Encounter: Payer: Self-pay | Admitting: Internal Medicine

## 2021-07-15 ENCOUNTER — Other Ambulatory Visit: Payer: Self-pay

## 2021-07-15 VITALS — BP 157/64 | HR 66 | Resp 16 | Ht 62.5 in | Wt 136.0 lb

## 2021-07-15 DIAGNOSIS — N183 Chronic kidney disease, stage 3 unspecified: Secondary | ICD-10-CM

## 2021-07-15 DIAGNOSIS — M109 Gout, unspecified: Secondary | ICD-10-CM | POA: Diagnosis not present

## 2021-07-15 IMAGING — DX DG CHEST 2V
2 series · 2 of 2 positions shown · non-contrast
Comparison: None.

CLINICAL DATA: Shortness of breath. [id number] virus infection.

EXAM:
CHEST - 2 VIEW

[dg chest 2 view (1 of 2)]
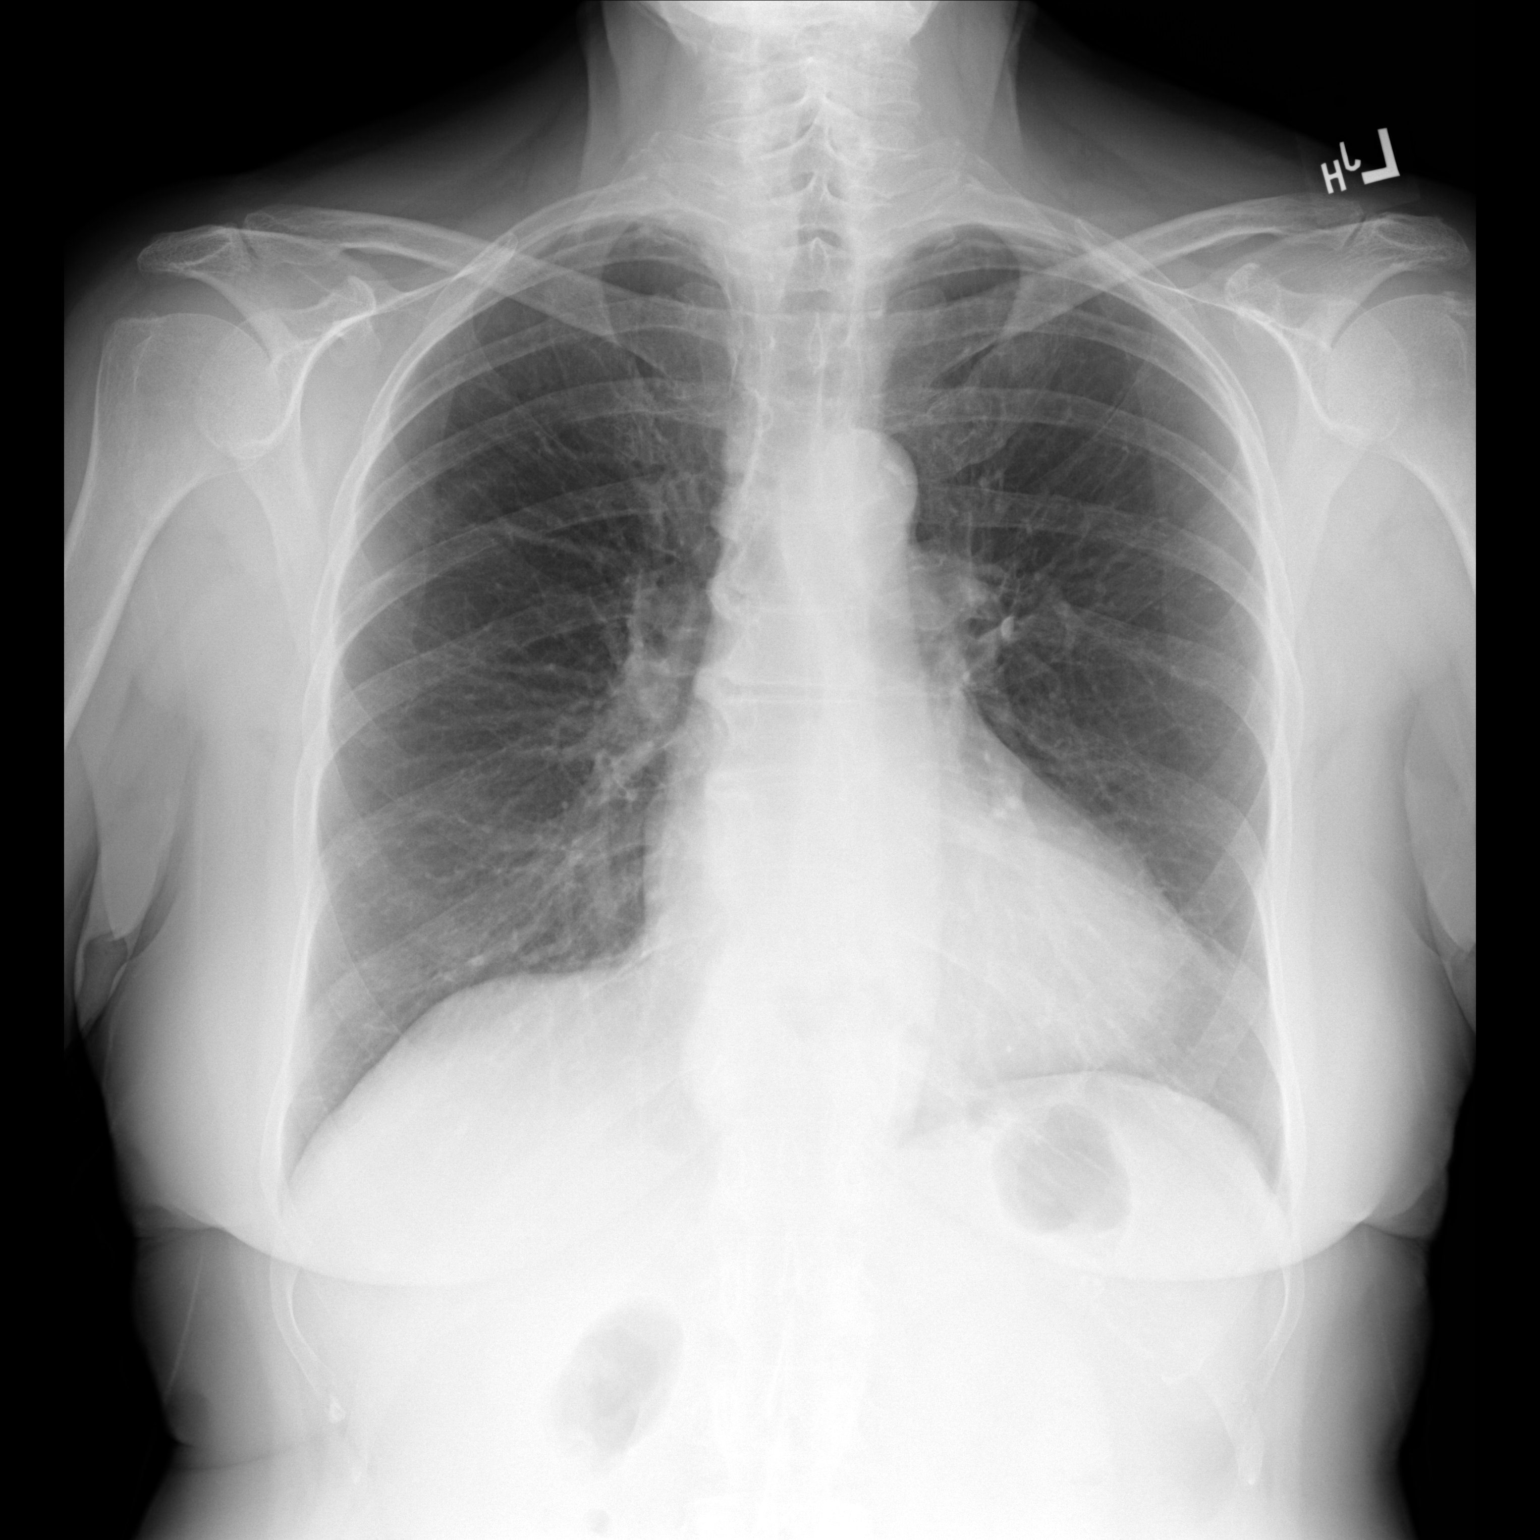

[dg chest 2 view (2 of 2)]
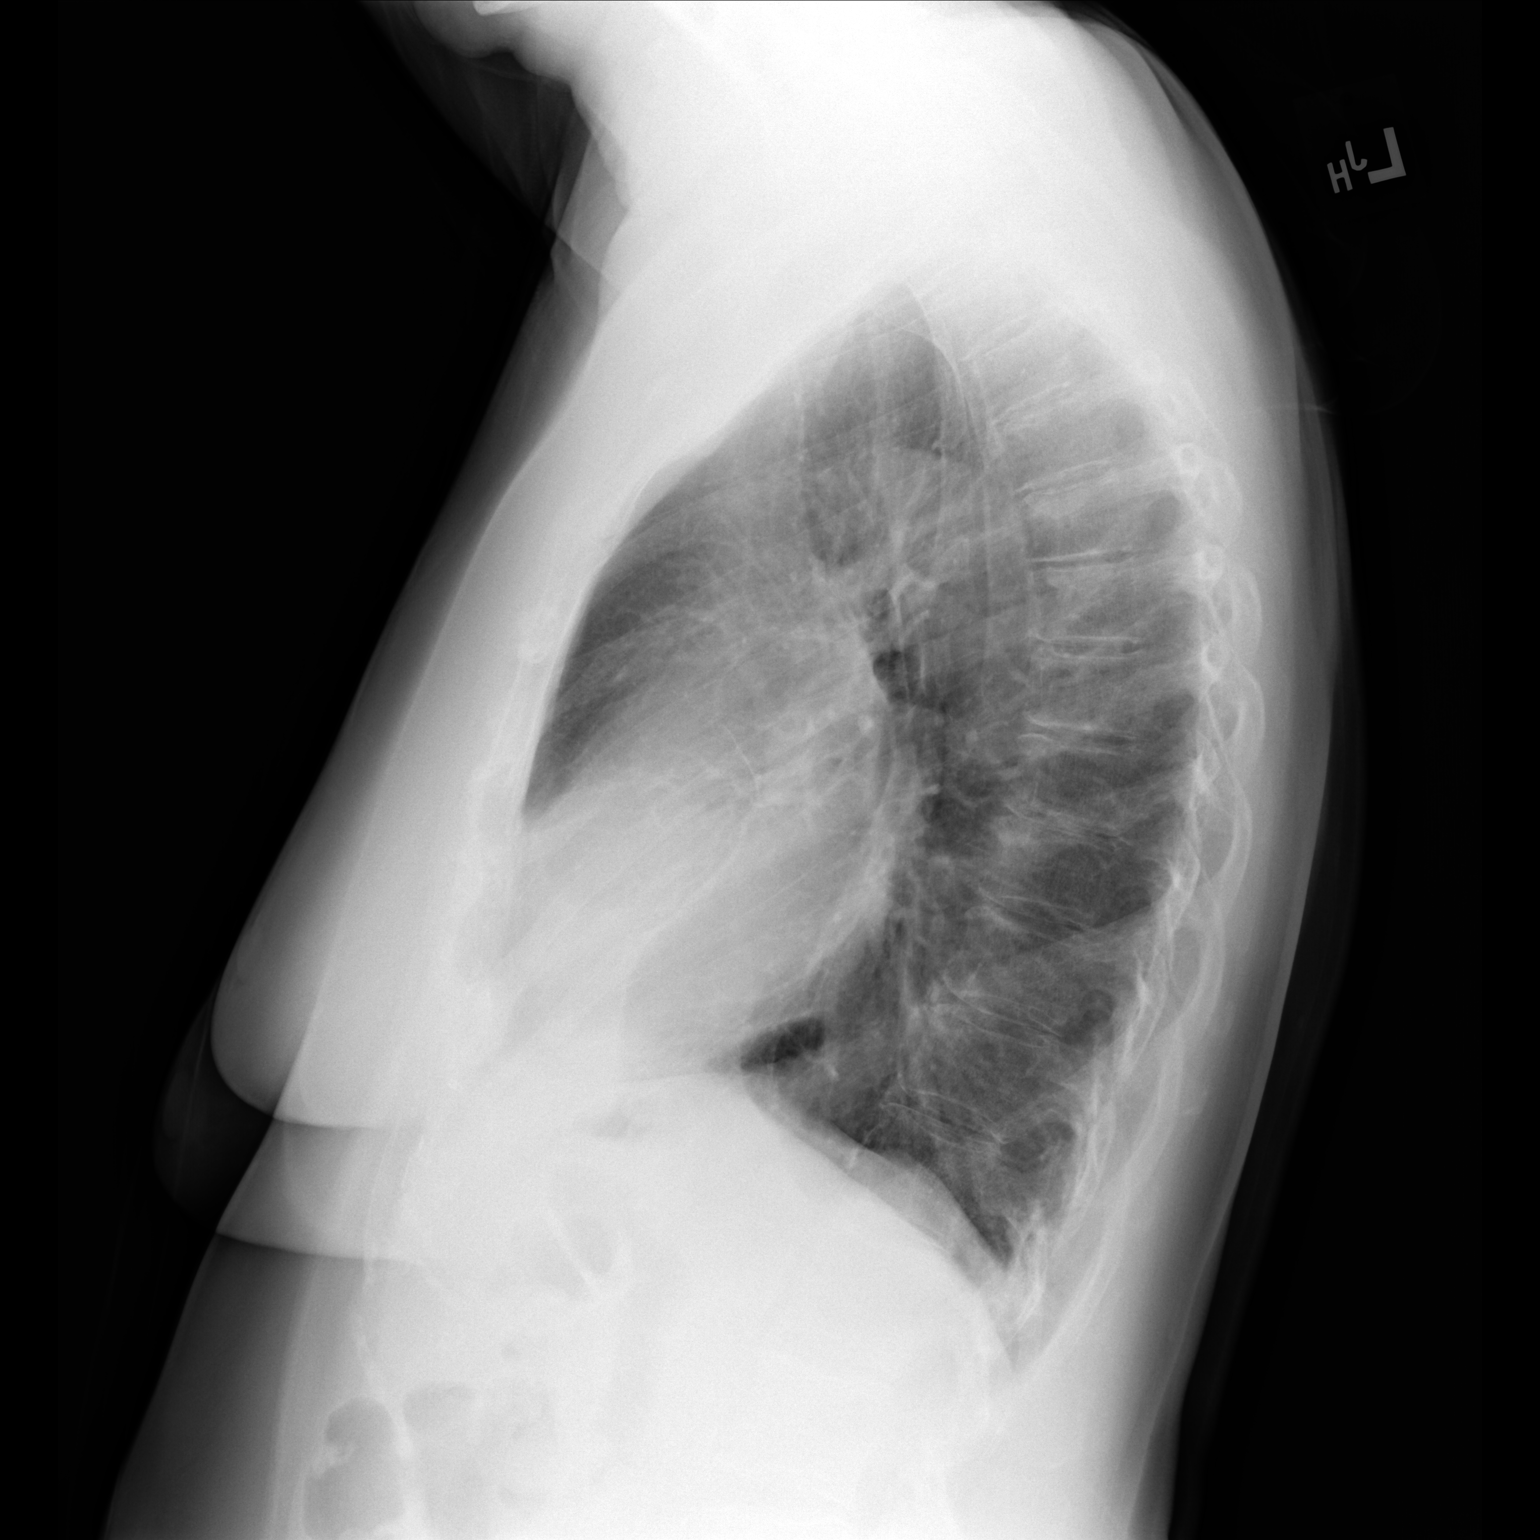

[2 of 2 positions shown; findings below may reference images not displayed]

FINDINGS: The heart size and mediastinal contours are within normal limits.
Aortic atherosclerosis incidentally noted. Both lungs are clear.
Small hiatal hernia noted. The visualized skeletal structures are
unremarkable.
IMPRESSION: 1. No active cardiopulmonary disease.
2. Small hiatal hernia.

## 2021-07-15 NOTE — Patient Instructions (Signed)
Low-Purine Eating Plan A low-purine eating plan involves making food choices to limit your intake of purine. Purine is a kind of uric acid. Too much uric acid in your blood can cause certain conditions, such as gout and kidney stones. Eating a low-purinediet can help control these conditions. What are tips for following this plan? Reading food labels Avoid foods with saturated or Trans fat. Check the ingredient list of grains-based foods, such as bread and cereal, to make sure that they contain whole grains. Check the ingredient list of sauces or soups to make sure they do not contain meat or fish. When choosing soft drinks, check the ingredient list to make sure they do not contain high-fructose corn syrup. Shopping  Buy plenty of fresh fruits and vegetables. Avoid buying canned or fresh fish. Buy dairy products labeled as low-fat or nonfat. Avoid buying premade or processed foods. These foods are often high in fat, salt (sodium), and added sugar.  Cooking Use olive oil instead of butter when cooking. Oils like olive oil, canola oil, and sunflower oil contain healthy fats. Meal planning Learn which foods do or do not affect you. If you find out that a food tends to cause your gout symptoms to flare up, avoid eating that food. You can enjoy foods that do not cause problems. If you have any questions about a food item, talk with your dietitian or health care provider. Limit foods high in fat, especially saturated fat. Fat makes it harder for your body to get rid of uric acid. Choose foods that are lower in fat and are lean sources of protein. General guidelines Limit alcohol intake to no more than 1 drink a day for nonpregnant women and 2 drinks a day for men. One drink equals 12 oz of beer, 5 oz of wine, or 1 oz of hard liquor. Alcohol can affect the way your body gets rid of uric acid. Drink plenty of water to keep your urine clear or pale yellow. Fluids can help remove uric acid from your  body. If directed by your health care provider, take a vitamin C supplement. Work with your health care provider and dietitian to develop a plan to achieve or maintain a healthy weight. Losing weight can help reduce uric acid in your blood. What foods are recommended? The items listed may not be a complete list. Talk with your dietitian aboutwhat dietary choices are best for you. Foods low in purines Foods low in purines do not need to be limited. These include: All fruits. All low-purine vegetables, pickles, and olives. Breads, pasta, [place], cornbread, and popcorn. Cake and other baked goods. All dairy foods. Eggs, nuts, and nut butters. Spices and condiments, such as salt, herbs, and vinegar. Plant oils, butter, and margarine. Water, sugar-free soft drinks, tea, coffee, and cocoa. Vegetable-based soups, broths, sauces, and gravies. Foods moderate in purines Foods moderate in purines should be limited to the amounts listed.  cup of asparagus, cauliflower, spinach, mushrooms, or green peas, each day. 2/3 cup uncooked oatmeal, each day.  cup dry wheat bran or wheat germ, each day. 2-3 ounces of meat or poultry, each day. 4-6 ounces of shellfish, such as crab, lobster, oysters, or shrimp, each day. 1 cup cooked beans, peas, or lentils, each day. Soup, broths, or bouillon made from meat or fish. Limit these foods as much as possible. What foods are not recommended? The items listed may not be a complete list. Talk with your dietitian aboutwhat dietary choices are best for you.   Limit your intake of foods high in purines, including: Beer and other alcohol. Meat-based gravy or sauce. Canned or fresh fish, such as: Anchovies, sardines, herring, and tuna. Mussels and scallops. Codfish, trout, and haddock. Bacon. Organ meats, such as: Liver or kidney. Tripe. Sweetbreads (thymus gland or pancreas). Wild game or goose. Yeast or yeast extract supplements. Drinks sweetened with  high-fructose corn syrup. Summary Eating a low-purine diet can help control conditions caused by too much uric acid in the body, such as gout or kidney stones. Choose low-purine foods, limit alcohol, and limit foods high in fat. You will learn over time which foods do or do not affect you. If you find out that a food tends to cause your gout symptoms to flare up, avoid eating that food. This information is not intended to replace advice given to you by your health care provider. Make sure you discuss any questions you have with your healthcare provider. Document Revised: 11/16/2019 Document Reviewed: 11/16/2019 Elsevier Patient Education  2022 Elsevier Inc.  

## 2021-07-16 LAB — URIC ACID: Uric Acid, Serum: 5.2 mg/dL (ref 2.5–7.0)

## 2021-07-17 ENCOUNTER — Telehealth: Payer: Self-pay | Admitting: Internal Medicine

## 2021-07-17 DIAGNOSIS — M109 Gout, unspecified: Secondary | ICD-10-CM

## 2021-07-17 DIAGNOSIS — N189 Chronic kidney disease, unspecified: Secondary | ICD-10-CM

## 2021-07-17 NOTE — Telephone Encounter (Signed)
Patient states Uric Acid results have come back. Patient would like a call back to let her know if she needs to stay on the same Allopurinol dosage, or change dosage? Please call to advise.

## 2021-07-18 NOTE — Telephone Encounter (Signed)
Patient advised Uric acid is improved from 7.7 to 5.2, which is below our goal of 6. Patient advised she should continue with the current allopurinol 100 mg 1 tablet daily. Patient advised when she is due for a refill to have the pharmacy to contact the office. Patient expressed understanding.

## 2021-07-18 NOTE — Telephone Encounter (Signed)
Uric acid is improved from 7.7 to 5.2, which is below our goal of 6. She should continue with the current allopurinol 100 mg 1 tablet daily.

## 2021-08-06 ENCOUNTER — Other Ambulatory Visit: Payer: Self-pay | Admitting: Internal Medicine

## 2021-08-06 DIAGNOSIS — M109 Gout, unspecified: Secondary | ICD-10-CM

## 2021-08-06 DIAGNOSIS — N189 Chronic kidney disease, unspecified: Secondary | ICD-10-CM

## 2021-08-06 NOTE — Telephone Encounter (Signed)
Next Visit: 10/14/2021  Last Visit: 07/15/2021  Last Fill: 06/09/2021  DX:  Podagra   Current Dose per lab note 06/06/2021: allopurinol 100 mg 1 tablets daily  Labs: 07/15/2021 Uric Acid 5.2,  06/06/2021 Urine study is normal and kidney function is similar as her previous ones, estimated GFR of 43.  Okay to refill Allopurinol?

## 2021-09-15 ENCOUNTER — Other Ambulatory Visit: Payer: Self-pay | Admitting: Cardiovascular Disease

## 2021-09-15 NOTE — Telephone Encounter (Signed)
Eliquis 5 mg refill request received. Patient is 76 years old, weight- 61.7 kg, Crea- 1.28 on 06/06/21, Diagnosis- afib, and last seen by Dr. Johnsie Cancel on 05/09/21. Dose is appropriate based on dosing criteria. Will send in refill to requested pharmacy.

## 2021-09-22 ENCOUNTER — Telehealth: Payer: Self-pay | Admitting: Cardiovascular Disease

## 2021-09-22 NOTE — Telephone Encounter (Signed)
New Message:    Patient said sheis having a Colonosopy and need to find out from Dr Johnsie Cancel  what  he wanted her to do about her blood thinner. Patient was tod told that her GI doctor need to call and get clearance for that. She said Dr Johnsie Cancel told her to call him when she had ay questions or concerns.Marland Kitchen

## 2021-09-22 NOTE — Telephone Encounter (Signed)
Per Pre-op clearance note on 06/19/21 , patient may hold Eliquis for 1 to 2 days prior to the procedure and restart as soon as possible afterward at the surgeon's discretion. Called patient with message. Patient verbalized understanding.

## 2021-10-03 DIAGNOSIS — D128 Benign neoplasm of rectum: Secondary | ICD-10-CM | POA: Diagnosis not present

## 2021-10-03 DIAGNOSIS — D122 Benign neoplasm of ascending colon: Secondary | ICD-10-CM | POA: Diagnosis not present

## 2021-10-03 DIAGNOSIS — K648 Other hemorrhoids: Secondary | ICD-10-CM | POA: Diagnosis not present

## 2021-10-03 DIAGNOSIS — K5730 Diverticulosis of large intestine without perforation or abscess without bleeding: Secondary | ICD-10-CM | POA: Diagnosis not present

## 2021-10-03 DIAGNOSIS — D123 Benign neoplasm of transverse colon: Secondary | ICD-10-CM | POA: Diagnosis not present

## 2021-10-03 DIAGNOSIS — Z86010 Personal history of colonic polyps: Secondary | ICD-10-CM | POA: Diagnosis not present

## 2021-10-07 DIAGNOSIS — D123 Benign neoplasm of transverse colon: Secondary | ICD-10-CM | POA: Diagnosis not present

## 2021-10-07 DIAGNOSIS — D122 Benign neoplasm of ascending colon: Secondary | ICD-10-CM | POA: Diagnosis not present

## 2021-10-07 DIAGNOSIS — D128 Benign neoplasm of rectum: Secondary | ICD-10-CM | POA: Diagnosis not present

## 2021-10-09 DIAGNOSIS — H18413 Arcus senilis, bilateral: Secondary | ICD-10-CM | POA: Diagnosis not present

## 2021-10-09 DIAGNOSIS — H02839 Dermatochalasis of unspecified eye, unspecified eyelid: Secondary | ICD-10-CM | POA: Diagnosis not present

## 2021-10-09 DIAGNOSIS — H04123 Dry eye syndrome of bilateral lacrimal glands: Secondary | ICD-10-CM | POA: Diagnosis not present

## 2021-10-09 DIAGNOSIS — Z961 Presence of intraocular lens: Secondary | ICD-10-CM | POA: Diagnosis not present

## 2021-10-13 NOTE — Progress Notes (Signed)
Office Visit Note  Patient: Alicia Rodgers             Date of Birth: 1946-07-24           MRN: [id number]             PCP: Aretta Nip, MD Referring: Aretta Nip, MD Visit Date: 10/14/2021   Subjective:  Follow-up (Doing good)   History of Present Illness: Alicia Rodgers is a 76 y.o. female here for follow up for gout on allopurinol 100 mg daily.  She has no new flare up of gout since our last visit in November and no trouble taking the allopurinol. She remains very adherent to a low purine diet her daughter is very health conscious and helps keep track of this such as rarely eating sweets.  Previous HPI 07/15/21 Alicia Rodgers is a 76 y.o. female here for follow up for gout 1 month follow up after starting allopurinol 100 mg PO daily. Uric acid was 7.7 and slow dose titration started due to CKD. She did not notice any trouble starting the allopurinol. She suffered a flare up of pain and swelling in the left foot starting in the forefoot eventually extending to the whole foot below the ankle within 2-3 days. She saw the ED and was prescribed mitigare and took as directed total of 3 tablets after an hour and experienced 12 hours of severe watery diarrhea afterwards but foot symptoms have improved. She still has mild swelling left, and feels partial numbness sensation in the forefoot.    Previous HPI 06/06/21 Alicia Rodgers is a 76 y.o. female here for gouty arthritis of the right foot with recurrent episodes treated with local steroid injection and most recent with daily colchicine. This started with the first time about 2 years ago and now 4 total episodes. This year was worse with one episode in June and another in September, with the last time taking a month to resolve. She has had local steroid injections to the 1st MTP joint usually with good relief but the last time slower to improve. Xrays from earlier this year demonstrated osteoarthritis change in the midfoot, with past  traumatic injury to the site, there was no erosive disease at the MTPs. She is on eliquis anticoagulation for permanent Afib. She previously took dyazide but not on any current diuretic treatments.   Review of Systems  Constitutional:  Positive for fatigue.  HENT:  Positive for mouth dryness.   Eyes:  Negative for dryness.  Respiratory:  Negative for shortness of breath.   Cardiovascular:  Negative for swelling in legs/feet.  Gastrointestinal:  Negative for constipation.  Endocrine: Negative for excessive thirst.  Genitourinary:  Negative for difficulty urinating.  Musculoskeletal:  Negative for morning stiffness.  Skin:  Negative for rash.  Allergic/Immunologic: Negative for susceptible to infections.  Neurological:  Negative for numbness.  Hematological:  Positive for bruising/bleeding tendency.  Psychiatric/Behavioral:  Positive for sleep disturbance.    PMFS History:  Patient Active Problem List   Diagnosis Date Noted   Type 2 diabetes mellitus with other specified complication ([place]) 69/48/5462   Podagra 06/06/2021   CKD (chronic kidney disease) 06/06/2021   Post-traumatic osteoarthritis, right ankle and foot 06/06/2021   Paresthesia 01/21/2016   PVC (premature ventricular contraction) 12/07/2011   Elevated lipids 03/29/2009   Essential hypertension 03/29/2009   CERVICAL CANCER, HX OF 03/29/2009   RHEUMATIC FEVER, HX OF 03/29/2009    Past Medical History:  Diagnosis Date  Anemia    Atrial fibrillation (HCC)    CERVICAL CANCER, HX OF    Fibrocystic breast    HYPERLIPIDEMIA    HYPERTENSION    Peripheral neuropathy    RHEUMATIC FEVER, HX OF     Family History  Problem Relation Age of Onset   Stroke Mother    Heart disease Father    Hypertension Brother    Hyperlipidemia Brother    Cancer Maternal Grandmother        Unsure   Heart disease Maternal Grandfather    Stroke Paternal Grandfather    Breast cancer Maternal Aunt    Breast cancer Paternal Aunt     Thyroid cancer Paternal Aunt    Past Surgical History:  Procedure Laterality Date   APPENDECTOMY     BREAST CYST EXCISION     BREAST EXCISIONAL BIOPSY Left    benign   CARDIOVERSION N/A 10/06/2018   Procedure: CARDIOVERSION;  Surgeon: Josue Hector, MD;  Location: [facility];  Service: Cardiovascular;  Laterality: N/A;   CESAREAN SECTION     x3   TONSILLECTOMY     TOTAL ABDOMINAL HYSTERECTOMY     Social History   Social History Narrative   Lives with her daughter and her family.   Right-handed.   1 cup caffeine daily.   Immunization History  Administered Date(s) Administered   PFIZER(Purple Top)SARS-COV-2 Vaccination 10/27/2019, 11/22/2019     Objective: Vital Signs: BP (!) 156/65 (BP Location: Right Arm, Patient Position: Sitting, Cuff Size: Normal)    Pulse 78    Resp 15    Ht 5' 2.5" (1.588 m)    Wt 137 lb (62.1 kg)    BMI 24.66 kg/m    Physical Exam Musculoskeletal:     Right lower leg: No edema.     Left lower leg: No edema.  Skin:    General: Skin is warm and dry.     Findings: No rash.  Neurological:     Mental Status: She is alert.  Psychiatric:        Mood and Affect: Mood normal.     Musculoskeletal Exam:  Elbows full ROM no tenderness or swelling Wrists full ROM no tenderness or swelling Fingers full ROM heberdon's nodes most advanced in PIPs b/l Bony nodule on right dorsal midfoot, no swelling in foot or toes   Investigation: No additional findings.  Imaging: No results found.  Recent Labs: Lab Results  Component Value Date   WBC 8.3 10/05/2018   HGB 13.4 10/05/2018   PLT 234 10/05/2018   NA 141 06/06/2021   K 3.8 06/06/2021   CL 104 06/06/2021   CO2 25 06/06/2021   GLUCOSE 150 (H) 06/06/2021   BUN 23 06/06/2021   CREATININE 1.28 (H) 06/06/2021   BILITOT 0.7 06/06/2021   AST 15 06/06/2021   ALT 15 06/06/2021   PROT 6.8 06/06/2021   CALCIUM 10.1 06/06/2021   GFRAA 55 (L) 08/21/2019    Speciality Comments: No specialty  comments available.  Procedures:  No procedures performed Allergies: Ciprofloxacin, Other, and Penicillins   Assessment / Plan:     Visit Diagnoses: Podagra - Plan: allopurinol (ZYLOPRIM) 100 MG tablet  Doing very well 0 symptomatic episodes in the past 3 months.  I believe she is not low risk of a significant gout flare on the current regimen. She can contact us if needing colchicine or steroids if flares return.  Plan to continue allopurinol 100 mg daily can follow-up in 1 year.  Stage  3 chronic kidney disease, unspecified whether stage 3a or 3b CKD (HCC) Chronic kidney disease, unspecified CKD stage - Plan: allopurinol (ZYLOPRIM) 100 MG tablet  Discussed relationship with kidney function and her gout again today to be aware if there are major changes.   Orders: No orders of the defined types were placed in this encounter.  Meds ordered this encounter  Medications   allopurinol (ZYLOPRIM) 100 MG tablet    Sig: Take 1 tablet (100 mg total) by mouth daily.    Dispense:  90 tablet    Refill:  3     Follow-Up Instructions: Return in about 1 year (around 10/14/2022) for Gout on allopurinol f/u 69yr.   Collier Salina, MD  Note - This record has been created using [employer].  Chart creation errors have been sought, but may not always  have been located. Such creation errors do not reflect on  the standard of medical care.

## 2021-10-14 ENCOUNTER — Ambulatory Visit (INDEPENDENT_AMBULATORY_CARE_PROVIDER_SITE_OTHER): Payer: Medicare Other | Admitting: Internal Medicine

## 2021-10-14 ENCOUNTER — Other Ambulatory Visit: Payer: Self-pay

## 2021-10-14 ENCOUNTER — Encounter: Payer: Self-pay | Admitting: Internal Medicine

## 2021-10-14 VITALS — BP 156/65 | HR 78 | Resp 15 | Ht 62.5 in | Wt 137.0 lb

## 2021-10-14 DIAGNOSIS — M109 Gout, unspecified: Secondary | ICD-10-CM | POA: Diagnosis not present

## 2021-10-14 DIAGNOSIS — N183 Chronic kidney disease, stage 3 unspecified: Secondary | ICD-10-CM

## 2021-10-14 DIAGNOSIS — N189 Chronic kidney disease, unspecified: Secondary | ICD-10-CM

## 2021-10-14 MED ORDER — ALLOPURINOL 100 MG PO TABS: 100.0000 mg | ORAL_TABLET | Freq: Every day | ORAL | 3 refills | Status: DC

## 2021-12-17 NOTE — Progress Notes (Signed)
? ?Cardiology Office Note ? ? ?Date:  12/30/2021  ? ?ID:  Alicia Rodgers, DOB [birth year], MRN [id number] ? ?PCP:  Aretta Nip, MD  ?Cardiologist:  Dr. Johnsie Cancel    ? ?No chief complaint on file. ? ? ? ?  ?History of Present Illness: ?Alicia Rodgers is a 76 y.o. female who presents for atrial fib. Diagnosed on office visit 08/25/18 TTE normal EF LA 44 mm  [facility] 10/06/18 with conversion but ERAF Decision made to rate control CHADVAS 3 on Eliquis and beta blocker  ?  ?She did have COVID in early Dec 2020 .  But has recovered F/U CXR NAD and had vaccine in April 2021   ? ?Middle daughter Donella Stade has PCOS and would like to see me regarding CAD risk in people with premature menapause ? ?Doing well Daughter in [place] got a place in [place]  ? ?Eliquis is expensive until she reaches her deductible  ? ?No cardiac issues History of gout in right foot Rx colchicine  ? ?Colonoscopy done - held eliquis with no issues ? ?Some issues with getting Eliquis at [facility] Has some arthritis in right foot from old injury and gout ? ?Has not had blood work in over a year and needs it given current medication list  ? ?Past Medical History:  ?Diagnosis Date  ? Anemia   ? Atrial fibrillation ([place])   ? CERVICAL CANCER, HX OF   ? Fibrocystic breast   ? HYPERLIPIDEMIA   ? HYPERTENSION   ? Peripheral neuropathy   ? RHEUMATIC FEVER, HX OF   ? ? ?Past Surgical History:  ?Procedure Laterality Date  ? APPENDECTOMY    ? BREAST CYST EXCISION    ? BREAST EXCISIONAL BIOPSY Left   ? benign  ? CARDIOVERSION N/A 10/06/2018  ? Procedure: CARDIOVERSION;  Surgeon: Josue Hector, MD;  Location: [facility] ENDOSCOPY;  Service: Cardiovascular;  Laterality: N/A;  ? CESAREAN SECTION    ? x3  ? TONSILLECTOMY    ? TOTAL ABDOMINAL HYSTERECTOMY    ? ? ? ?Current Outpatient Medications  ?Medication Sig Dispense Refill  ? allopurinol (ZYLOPRIM) 100 MG tablet Take 1 tablet (100 mg total) by mouth daily. 90 tablet 3  ? apixaban (ELIQUIS) 5 MG TABS tablet TAKE ONE TABLET BY MOUTH  TWICE DAILY 60 tablet 6  ? atenolol (TENORMIN) 25 MG tablet TAKE 1 TABLET BY MOUTH TWICE DAILY. 180 tablet 3  ? colchicine 0.6 MG tablet Take 1 tablet (0.6 mg total) by mouth daily. Take for gout flare. 30 tablet 2  ? esomeprazole (NEXIUM) 20 MG capsule Take 20 mg by mouth daily as needed (heartburn).    ? hydrochlorothiazide (HYDRODIURIL) 12.5 MG tablet Take 12.5 mg by mouth daily.    ? losartan (COZAAR) 100 MG tablet Take 100 mg by mouth daily.    ? Multiple Vitamins-Calcium (DAILY VITAMINS FOR WOMEN PO) Take 1 tablet by mouth daily.    ? pravastatin (PRAVACHOL) 10 MG tablet Take 10 mg by mouth daily.    ? doxycycline (VIBRAMYCIN) 100 MG capsule  (Patient not taking: Reported on 12/30/2021)    ? ?Current Facility-Administered Medications  ?Medication Dose Route Frequency Provider Last Rate Last Admin  ? dexamethasone (DECADRON) injection 4 mg  4 mg Intra-articular Once Trula Slade, DPM      ? ? ?Allergies:   Ciprofloxacin, Other, and Penicillins  ? ? ?Social History:  The patient  reports that she has never smoked. She has never used smokeless tobacco.  She reports that she does not drink alcohol and does not use drugs.  ? ?Family History:  The patient's family history includes Breast cancer in her maternal aunt and paternal aunt; Cancer in her maternal grandmother; Heart disease in her father and maternal grandfather; Hyperlipidemia in her brother; Hypertension in her brother; Stroke in her mother and paternal grandfather; Thyroid cancer in her paternal aunt.  ? ? ?ROS:  General:no colds or fevers currently, no weight changes ?Skin:no rashes or ulcers ?HEENT:one episode of blurred vision with rapid HR, no congestion ?CV:see HPI ?PUL:see HPI ?GI:no diarrhea constipation or melena, no indigestion ?GU:no hematuria, no dysuria ?MS:no joint pain, no claudication ?Neuro:no syncope, no lightheadedness ?Endo:no diabetes, no thyroid disease ? ?Wt Readings from Last 3 Encounters:  ?12/30/21 136 lb (61.7 kg)  ?10/14/21  137 lb (62.1 kg)  ?07/15/21 136 lb (61.7 kg)  ?  ? ?PHYSICAL EXAM: ?VS:  BP 124/88   Pulse 66   Ht '5\' 3"'$  (1.6 m)   Wt 136 lb (61.7 kg)   SpO2 98%   BMI 24.09 kg/m?  , BMI Body mass index is 24.09 kg/m?. ? ?Affect appropriate ?Healthy:  appears stated age ?HEENT: normal ?Neck supple with no adenopathy ?JVP normal no bruits no thyromegaly ?Lungs clear with no wheezing and good diaphragmatic motion ?Heart:  S1/S2 1/6 SEM murmur, no rub, gallop or click ?PMI normal ?Abdomen: benighn, BS positve, no tenderness, no AAA ?no bruit.  No HSM or HJR ?Distal pulses intact with no bruits ?No edema ?Neuro non-focal ?Skin warm and dry ?No muscular weakness ? ?EKG:    Atrial flutter rate 67 nonspecific ST changes 12/30/2021 afib rate 66 PVC no acute changes  ? ?Recent Labs: ?06/06/2021: ALT 15; BUN 23; Creat 1.28; Potassium 3.8; Sodium 141  ? ? ?Lipid Panel ?No results found for: CHOL, TRIG, HDL, CHOLHDL, VLDL, LDLCALC, LDLDIRECT ?  ?Other studies Reviewed: ?Additional studies/ records that were reviewed today include: . ?Echo 08/2018 ?Study Conclusions ?  ?- Left ventricle: The cavity size was normal. Systolic function was ?  normal. The estimated ejection fraction was in the range of 60% ?  to 65%. Wall motion was normal; there were no regional wall ?  motion abnormalities. ?- Mitral valve: Valve area by continuity equation (using LVOT ?  flow): 2.52 cm^2. ?- Left atrium: The atrium was severely dilated. ?- Right atrium: The atrium was mildly dilated. ?- Pulmonary arteries: Systolic pressure was within the normal ?  range. PA peak pressure: 34 mm Hg (S). ? ? ? ASSESSMENT AND PLAN: ? ?1.  Afib: permanent rate control and anticoagulation good Severe LAE failed [facility] Labs done by primary  [place] Rankin q 6 months  ? ?2. Hx of COVID CXR 08/21/19 NAD Received vaccine April 2021     ? ?3.  Murmur with clindamycin for dental procedures with hx of rheumatic fever. Echo done 09/01/18 no significant valve disease will update  ? ?4  HTN :  Well controlled.  Continue current medications and low sodium Dash type diet.   ? ?5. HLD:  On statin labs with primary Discussed utility of calcium score ? ?6. Gout:  post rx steroid injection and colchicine has f/u with rheumatology on allopurinol and adhering to low purine diet  ? ? ?Current medicines are reviewed with the patient today.  The patient Has no concerns regarding medicines. ? ?The following changes have been made:  See above ?Labs/ tests ordered today include:  CBC,CMET,lipids  ? ?Disposition:   FU:  6 months  ? ?Signed, ?[name], MD  ?12/30/2021 8:40 AM    ?[facility] ?[place], [address] [place], [place] ?[place], [place] ?Phone: [phone]; Fax: [phone]  ?[phone] ?

## 2021-12-30 ENCOUNTER — Ambulatory Visit (INDEPENDENT_AMBULATORY_CARE_PROVIDER_SITE_OTHER): Payer: Medicare Other | Admitting: Cardiovascular Disease

## 2021-12-30 ENCOUNTER — Encounter: Payer: Self-pay | Admitting: Cardiovascular Disease

## 2021-12-30 VITALS — BP 124/88 | HR 66 | Ht 63.0 in | Wt 136.0 lb

## 2021-12-30 DIAGNOSIS — I482 Chronic atrial fibrillation, unspecified: Secondary | ICD-10-CM

## 2021-12-30 DIAGNOSIS — I10 Essential (primary) hypertension: Secondary | ICD-10-CM

## 2021-12-30 DIAGNOSIS — I493 Ventricular premature depolarization: Secondary | ICD-10-CM

## 2021-12-30 LAB — CBC WITH DIFFERENTIAL/PLATELET
Basophils Absolute: 0 10*3/uL (ref 0.0–0.2)
Basos: 1 %
EOS (ABSOLUTE): 0.1 10*3/uL (ref 0.0–0.4)
Eos: 2 %
Hematocrit: 41.6 % (ref 34.0–46.6)
Hemoglobin: 14.3 g/dL (ref 11.1–15.9)
Immature Grans (Abs): 0 10*3/uL (ref 0.0–0.1)
Immature Granulocytes: 0 %
Lymphocytes Absolute: 1.7 10*3/uL (ref 0.7–3.1)
Lymphs: 30 %
MCH: 29.3 pg (ref 26.6–33.0)
MCHC: 34.4 g/dL (ref 31.5–35.7)
MCV: 85 fL (ref 79–97)
Monocytes Absolute: 0.5 10*3/uL (ref 0.1–0.9)
Monocytes: 9 %
Neutrophils Absolute: 3.2 10*3/uL (ref 1.4–7.0)
Neutrophils: 58 %
Platelets: 220 10*3/uL (ref 150–450)
RBC: 4.88 x10E6/uL (ref 3.77–5.28)
RDW: 13.1 % (ref 11.7–15.4)
WBC: 5.6 10*3/uL (ref 3.4–10.8)

## 2021-12-30 LAB — COMPREHENSIVE METABOLIC PANEL
ALT: 19 IU/L (ref 0–32)
AST: 19 IU/L (ref 0–40)
Albumin/Globulin Ratio: 1.4 (ref 1.2–2.2)
Albumin: 4.2 g/dL (ref 3.7–4.7)
Alkaline Phosphatase: 77 IU/L (ref 44–121)
BUN/Creatinine Ratio: 19 (ref 12–28)
BUN: 21 mg/dL (ref 8–27)
Bilirubin Total: 0.6 mg/dL (ref 0.0–1.2)
CO2: 25 mmol/L (ref 20–29)
Calcium: 10.4 mg/dL — ABNORMAL HIGH (ref 8.7–10.3)
Chloride: 103 mmol/L (ref 96–106)
Creatinine, Ser: 1.13 mg/dL — ABNORMAL HIGH (ref 0.57–1.00)
Globulin, Total: 2.9 g/dL (ref 1.5–4.5)
Glucose: 112 mg/dL — ABNORMAL HIGH (ref 70–99)
Potassium: 4.1 mmol/L (ref 3.5–5.2)
Sodium: 141 mmol/L (ref 134–144)
Total Protein: 7.1 g/dL (ref 6.0–8.5)
eGFR: 50 mL/min/{1.73_m2} — ABNORMAL LOW (ref >59)

## 2021-12-30 LAB — LIPID PANEL
Chol/HDL Ratio: 3.4 ratio (ref 0.0–4.4)
Cholesterol, Total: 183 mg/dL (ref 100–199)
HDL: 54 mg/dL (ref >39)
LDL Chol Calc (NIH): 111 mg/dL — ABNORMAL HIGH (ref 0–99)
Triglycerides: 97 mg/dL (ref 0–149)
VLDL Cholesterol Cal: 18 mg/dL (ref 5–40)

## 2021-12-30 NOTE — Patient Instructions (Addendum)
Medication Instructions:  ?Your physician recommends that you continue on your current medications as directed. Please refer to the Current Medication list given to you today. ? ?*If you need a refill on your cardiac medications before your next appointment, please call your pharmacy* ? ?Lab Work: ?Your physician recommends that you have lab work today- CMET, CBC, and Lipid Panel ? ?If you have labs (blood work) drawn today and your tests are completely normal, you will receive your results only by: ?MyChart Message (if you have MyChart) OR ?A paper copy in the mail ?If you have any lab test that is abnormal or we need to change your treatment, we will call you to review the results. ? ?Testing/Procedures: ?None ordered today. ? ?Follow-Up: ?At [facility], you and your health needs are our priority.  As part of our continuing mission to provide you with exceptional heart care, we have created designated Provider Care Teams.  These Care Teams include your primary Cardiologist (physician) and Advanced Practice Providers (APPs -  Physician Assistants and Nurse Practitioners) who all work together to provide you with the care you need, when you need it. ? ?We recommend signing up for the patient portal called "MyChart".  Sign up information is provided on this After Visit Summary.  MyChart is used to connect with patients for Virtual Visits (Telemedicine).  Patients are able to view lab/test results, encounter notes, upcoming appointments, etc.  Non-urgent messages can be sent to your provider as well.   ?To learn more about what you can do with MyChart, go to [email or web address].   ? ?Your next appointment:   ?6 month(s) ? ?The format for your next appointment:   ?In Person ? ?Provider:   ?[name], MD { ? ? ? ?Important Information About Sugar ? ? ? ? ?  ?

## 2022-01-05 ENCOUNTER — Telehealth: Payer: Self-pay | Admitting: Cardiovascular Disease

## 2022-01-05 MED ORDER — PRAVASTATIN SODIUM 10 MG PO TABS: 10.0000 mg | ORAL_TABLET | Freq: Every day | ORAL | 3 refills | Status: DC

## 2022-01-05 MED ORDER — LOSARTAN POTASSIUM 100 MG PO TABS: 100.0000 mg | ORAL_TABLET | Freq: Every day | ORAL | 3 refills | Status: DC

## 2022-01-05 MED ORDER — HYDROCHLOROTHIAZIDE 12.5 MG PO TABS: 12.5000 mg | ORAL_TABLET | Freq: Every day | ORAL | 3 refills | Status: DC

## 2022-01-05 NOTE — Telephone Encounter (Signed)
*  STAT* If patient is at the pharmacy, call can be transferred to refill team.   1. Which medications need to be refilled? (please list name of each medication and dose if known)   hydrochlorothiazide (HYDRODIURIL) 12.5 MG tablet    losartan (COZAAR) 100 MG tablet    pravastatin (PRAVACHOL) 10 MG tablet    2. Which pharmacy/location (including street and city if local pharmacy) is medication to be sent to? [place], [address]  3. Do they need a 30 day or 90 day supply?  90 day

## 2022-01-05 NOTE — Telephone Encounter (Signed)
Refills sent into [facility].

## 2022-04-07 ENCOUNTER — Encounter: Payer: Self-pay | Admitting: Cardiovascular Disease

## 2022-04-07 ENCOUNTER — Emergency Department (HOSPITAL_COMMUNITY)
Admission: EM | Admit: 2022-04-07 | Discharge: 2022-04-08 | Disposition: A | Discharge status: Home / Self Care | Payer: Medicare Other | Attending: Emergency Medicine | Admitting: Emergency Medicine

## 2022-04-07 ENCOUNTER — Emergency Department (HOSPITAL_COMMUNITY): Payer: Medicare Other

## 2022-04-07 ENCOUNTER — Telehealth: Payer: Self-pay | Admitting: Cardiology

## 2022-04-07 ENCOUNTER — Telehealth: Payer: Self-pay | Admitting: Cardiovascular Disease

## 2022-04-07 DIAGNOSIS — Z7901 Long term (current) use of anticoagulants: Secondary | ICD-10-CM | POA: Insufficient documentation

## 2022-04-07 DIAGNOSIS — I129 Hypertensive chronic kidney disease with stage 1 through stage 4 chronic kidney disease, or unspecified chronic kidney disease: Secondary | ICD-10-CM | POA: Diagnosis not present

## 2022-04-07 DIAGNOSIS — I10 Essential (primary) hypertension: Secondary | ICD-10-CM | POA: Diagnosis not present

## 2022-04-07 DIAGNOSIS — E041 Nontoxic single thyroid nodule: Secondary | ICD-10-CM | POA: Insufficient documentation

## 2022-04-07 DIAGNOSIS — N189 Chronic kidney disease, unspecified: Secondary | ICD-10-CM | POA: Diagnosis not present

## 2022-04-07 DIAGNOSIS — R519 Headache, unspecified: Secondary | ICD-10-CM | POA: Diagnosis not present

## 2022-04-07 DIAGNOSIS — R030 Elevated blood-pressure reading, without diagnosis of hypertension: Secondary | ICD-10-CM

## 2022-04-07 DIAGNOSIS — M542 Cervicalgia: Secondary | ICD-10-CM | POA: Diagnosis not present

## 2022-04-07 DIAGNOSIS — Z79899 Other long term (current) drug therapy: Secondary | ICD-10-CM | POA: Diagnosis not present

## 2022-04-07 DIAGNOSIS — I4819 Other persistent atrial fibrillation: Secondary | ICD-10-CM | POA: Diagnosis not present

## 2022-04-07 DIAGNOSIS — R29818 Other symptoms and signs involving the nervous system: Secondary | ICD-10-CM | POA: Diagnosis not present

## 2022-04-07 DIAGNOSIS — R42 Dizziness and giddiness: Secondary | ICD-10-CM | POA: Insufficient documentation

## 2022-04-07 LAB — CBC WITH DIFFERENTIAL/PLATELET
Abs Immature Granulocytes: 0.02 10*3/uL (ref 0.00–0.07)
Basophils Absolute: 0 10*3/uL (ref 0.0–0.1)
Basophils Relative: 1 %
Eosinophils Absolute: 0.1 10*3/uL (ref 0.0–0.5)
Eosinophils Relative: 1 %
HCT: 41 % (ref 36.0–46.0)
Hemoglobin: 14.1 g/dL (ref 12.0–15.0)
Immature Granulocytes: 0 %
Lymphocytes Relative: 26 %
Lymphs Abs: 1.7 10*3/uL (ref 0.7–4.0)
MCH: 29.8 pg (ref 26.0–34.0)
MCHC: 34.4 g/dL (ref 30.0–36.0)
MCV: 86.7 fL (ref 80.0–100.0)
Monocytes Absolute: 0.6 10*3/uL (ref 0.1–1.0)
Monocytes Relative: 9 %
Neutro Abs: 4.3 10*3/uL (ref 1.7–7.7)
Neutrophils Relative %: 63 %
Platelets: 205 10*3/uL (ref 150–400)
RBC: 4.73 MIL/uL (ref 3.87–5.11)
RDW: 12.8 % (ref 11.5–15.5)
WBC: 6.6 10*3/uL (ref 4.0–10.5)
nRBC: 0 % (ref 0.0–0.2)

## 2022-04-07 LAB — URINALYSIS, ROUTINE W REFLEX MICROSCOPIC
Bacteria, UA: NONE SEEN
Bilirubin Urine: NEGATIVE
Glucose, UA: NEGATIVE mg/dL
Hgb urine dipstick: NEGATIVE
Ketones, ur: NEGATIVE mg/dL
Nitrite: NEGATIVE
Protein, ur: NEGATIVE mg/dL
Specific Gravity, Urine: 1.006 (ref 1.005–1.030)
pH: 6 (ref 5.0–8.0)

## 2022-04-07 LAB — BASIC METABOLIC PANEL
Anion gap: 10 (ref 5–15)
BUN: 18 mg/dL (ref 8–23)
CO2: 22 mmol/L (ref 22–32)
Calcium: 9.8 mg/dL (ref 8.9–10.3)
Chloride: 107 mmol/L (ref 98–111)
Creatinine, Ser: 1.02 mg/dL — ABNORMAL HIGH (ref 0.44–1.00)
GFR, Estimated: 57 mL/min — ABNORMAL LOW (ref >60)
Glucose, Bld: 125 mg/dL — ABNORMAL HIGH (ref 70–99)
Potassium: 3.4 mmol/L — ABNORMAL LOW (ref 3.5–5.1)
Sodium: 139 mmol/L (ref 135–145)

## 2022-04-07 MED ORDER — IOHEXOL 350 MG/ML SOLN
75.0000 mL | Freq: Once | INTRAVENOUS | Status: AC | PRN
  Administered 2022-04-07: 75 mL via INTRAVENOUS

## 2022-04-07 NOTE — Telephone Encounter (Signed)
Spoke with pt who states current BP 1 hour after Losartan and HCTZ is 176/85 - HR 75.  She states she feels better but remains anxious.  She has not yet taken her Atenolol.  Recommended pt take Atenolol '25mg'$  - 1 tablet by mouth bid to see if that also helps with BP and anxious feelings.  Recheck BP in 1 hour and send BP reading and HR through MyChart message.  Pt verbalizes understanding and agrees with current plan.

## 2022-04-07 NOTE — Telephone Encounter (Signed)
This is being addressed in a pt Advise MyChart message dated 04/07/2022.

## 2022-04-07 NOTE — ED Triage Notes (Signed)
Pt c/o hypertension x8am, states she noticed she was feeling lightheaded & noted htn. States BP decreased around lunch time, increased in afternoon. Manual BP approx 1730 was 210/100 & 196/99, called cards office, advised to come in.  Endorses "slight" HA, L sided neck pain- "just annoying, like I know it's there."

## 2022-04-07 NOTE — Telephone Encounter (Signed)
See MyChart encounter 04/07/2022 for complete details.

## 2022-04-07 NOTE — ED Provider Triage Note (Signed)
Emergency Medicine Provider Triage Evaluation Note  MIRELY PANGLE , a 76 y.o. female  was evaluated in triage.  Pt complains of elevated blood pressure that started earlier this morning.  Patient checked her BP which was in the 200s/100s.  Patient endorses a left-sided headache and neck pain.  Patient called cardiology office told patient report to the ED for further evaluation.  No visual changes, speech changes, or unilateral weakness.  Denies dizziness.  History of hypertension.  She has been compliant with her medications.  Review of Systems  Positive: HA Negative: Visual changes  Physical Exam  BP (!) 170/82 (BP Location: Right Arm)   Pulse 62   Temp 97.8 F (36.6 C) (Oral)   Resp 20   SpO2 98%  Gen:   Awake, no distress   Resp:  Normal effort  MSK:   Moves extremities without difficulty  Other:  AAOx4. Normal speech. No facial droop  Medical Decision Making  Medically screening exam initiated at 8:05 PM.  Appropriate orders placed.  SHAMBHAVI SALLEY was informed that the remainder of the evaluation will be completed by another provider, this initial triage assessment does not replace that evaluation, and the importance of remaining in the ED until their evaluation is complete.  BP has improved from home. Labs/Ct head to rule out end organ damage.   Suzy Bouchard, [place] 04/07/22 2008

## 2022-04-07 NOTE — Telephone Encounter (Signed)
Pt c/o BP issue: STAT if pt c/o blurred vision, one-sided weakness or slurred speech  1. What are your last 5 BP readings? 202/101      195/70  2. Are you having any other symptoms (ex. Dizziness, headache, blurred vision, passed out)? States she has a shaky nervous feeling.  She states she is A-Fib all the time. States she was dizzy just for a second.   3. What is your BP issue? Calling to report her elevated blood pressure, wants to know what she should do.  She states she doesn't feel right. She took her blood pressure medication and 30 mins later her blood pressure was 195/70.  She states she can hear pounding in her ear.

## 2022-04-07 NOTE — Telephone Encounter (Signed)
I received a call from patient's daughter, [name], who reported that the patient's systolic BP was elevated into the 190s all day. Daughter reported that he mother took all of her scheduled BP medications as normal this AM, but her HR remained elevated. This evening, BP was 210/90. Repeat BP was 195/95. Patient denied having any chest pain, but did have pain in the left side of her neck that spread to her head, associated with left sided headache. Also complained of some lightheadedness and fluttering feeling in her chest. She denied SOB, vision changes, syncope/near syncope.   As patient's SBP was around 200, and she was having left sided neck pain, I encouraged daughter to take the patient to the ER. Patient and daughter voiced understanding.   Margie Billet, PA-C 04/07/2022 6:32 PM

## 2022-04-07 NOTE — Telephone Encounter (Signed)
Spoke with pt who reports BP readings as below prior to BP medication at 830am.  She states she is in permanent Afib but could feel her heart pounding in her ear as well as feeling as if she was having an anxiety attack.  She also had some lightheadedness that quickly resolved.  She denies current CP, SOB or dizziness.  Requested pt to recheck BP now and she declines and states she would like to rest a few minutes and not be on the phone as this may elevate her BP.  Pt advised RN will call her at 930am for most up to date BP readings.  Pt verbalizes understanding and agrees with current plan.

## 2022-04-07 NOTE — Telephone Encounter (Signed)
Patient called to report her blood pressure reading for 10:30am: 172/91 HR 60

## 2022-04-08 ENCOUNTER — Ambulatory Visit: Payer: Medicare Other | Admitting: Nurse Practitioner

## 2022-04-08 ENCOUNTER — Emergency Department (HOSPITAL_COMMUNITY): Payer: Medicare Other

## 2022-04-08 DIAGNOSIS — M542 Cervicalgia: Secondary | ICD-10-CM | POA: Diagnosis not present

## 2022-04-08 DIAGNOSIS — R519 Headache, unspecified: Secondary | ICD-10-CM | POA: Diagnosis not present

## 2022-04-08 DIAGNOSIS — I10 Essential (primary) hypertension: Secondary | ICD-10-CM | POA: Diagnosis not present

## 2022-04-08 MED ORDER — ACETAMINOPHEN 325 MG PO TABS
650.0000 mg | ORAL_TABLET | Freq: Four times a day (QID) | ORAL | Status: DC | PRN
  Filled 2022-04-08: qty 2

## 2022-04-08 MED ORDER — POTASSIUM CHLORIDE CRYS ER 20 MEQ PO TBCR
20.0000 meq | EXTENDED_RELEASE_TABLET | Freq: Once | ORAL | Status: AC
  Administered 2022-04-08: 20 meq via ORAL
  Filled 2022-04-08: qty 1

## 2022-04-08 MED ORDER — ATENOLOL 25 MG PO TABS
25.0000 mg | ORAL_TABLET | Freq: Once | ORAL | Status: AC
  Administered 2022-04-08: 25 mg via ORAL
  Filled 2022-04-08: qty 1

## 2022-04-08 NOTE — Discharge Instructions (Addendum)
Your CT scan today showed a thyroid nodule.  This will need to be followed up by your family doctor for further evaluation.

## 2022-04-08 NOTE — Progress Notes (Unsigned)
Office Visit    Patient Name: Alicia Rodgers Date of Encounter: 04/09/2022  PCP:  [facility]  Cardiologist:  [name], MD  Advanced Practice Provider:  No care team member to display Electrophysiologist:  None      Chief Complaint    Alicia Rodgers is a 76 y.o. female presents today for ED follow up   Past Medical History    Past Medical History:  Diagnosis Date   Anemia    Atrial fibrillation ([place])    CERVICAL CANCER, HX OF    Fibrocystic breast    HYPERLIPIDEMIA    HYPERTENSION    Peripheral neuropathy    RHEUMATIC FEVER, HX OF    Past Surgical History:  Procedure Laterality Date   APPENDECTOMY     BREAST CYST EXCISION     BREAST EXCISIONAL BIOPSY Left    benign   CARDIOVERSION N/A 10/06/2018   Procedure: CARDIOVERSION;  Surgeon: Josue Hector, MD;  Location: [facility] ENDOSCOPY;  Service: Cardiovascular;  Laterality: N/A;   CESAREAN SECTION     x3   TONSILLECTOMY     TOTAL ABDOMINAL HYSTERECTOMY      Allergies  Allergies  Allergen Reactions   Ciprofloxacin Other (See Comments)   Penicillins Swelling and Rash    Did it involve swelling of the face/tongue/throat, SOB, or low BP? No Did it involve sudden or severe rash/hives, skin peeling, or any reaction on the inside of your mouth or nose? No Did you need to seek medical attention at a hospital or doctor's office? No When did it last happen?      40 years ago If all above answers are "NO", may proceed with cephalosporin use.      History of Present Illness    Alicia Rodgers is a 76 y.o. female with a hx of atrial fibrillation, hypertension, hyperlipidemia, gout, COVID-19 07/2019 not requiring hospitalization, rheumatic fever last seen 12/30/2021 by Dr. Johnsie Cancel.  Atrial fibrillation diagnosed during office visit January 2020.  TTE at that time with normal LVEF.  Underwent DCCV 09/2018 for early return of atrial fibrillation with decision made  to rate control.    Last seen 12/30/2021.  She is doing well from a cardiac perspective.  Labs updated and recommended follow-up in 6 months.  ED visit 04/07/2022 for hypertension, headache.  BP at home 779 systolic, took her morning medication, symptoms and blood pressure improved.  CTA and MRI negative for acute abnormality. CT with inadvertent finding of 2 mm thyroid nodule recommended for ultrasound. Of note blood pressure improved with one of her medications that was missed in the evening.  Presents today for follow up with her daughter. BP this morning was high at home. For the last 2-3 days BP at home 150s-170s with her arm cuff. Has not been checked for accuracy. Noted lightheadedness when she woke that morning which prompted her to check her blood pressure. Takes her HCTZ, Losartan in the morning. Takes her Atenolol and eliquis around 10:30-11pm and then 10:30 in the evening. Walking her dog intermittently for exercise. No recent stressors to precipitate elevated BP.   EKGs/Labs/Other Studies Reviewed:   The following studies were reviewed today: Echo 08/2018 Study Conclusions   - Left ventricle: The cavity size was normal. Systolic function was   normal. The estimated ejection fraction was in the range of 60%   to 65%. Wall motion was normal; there were no regional wall  motion abnormalities. - Mitral valve: Valve area by continuity equation (using LVOT   flow): 2.52 cm^2. - Left atrium: The atrium was severely dilated. - Right atrium: The atrium was mildly dilated. - Pulmonary arteries: Systolic pressure was within the normal   range. PA peak pressure: 34 mm Hg (S).    EKG:  No EKG today.  Recent Labs: 12/30/2021: ALT 19 04/07/2022: BUN 18; Creatinine, Ser 1.02; Hemoglobin 14.1; Platelets 205; Potassium 3.4; Sodium 139  Recent Lipid Panel    Component Value Date/Time   CHOL 183 12/30/2021 0900   TRIG 97 12/30/2021 0900   HDL 54 12/30/2021 0900   CHOLHDL 3.4 12/30/2021 0900    LDLCALC 111 (H) 12/30/2021 0900    Risk Assessment/Calculations:   CHA2DS2-VASc Score = 5   This indicates a 7.2% annual risk of stroke. The patient's score is based upon: CHF History: 0 HTN History: 1 Diabetes History: 1 Stroke History: 0 Vascular Disease History: 0 Age Score: 2 Gender Score: 1     Home Medications   Current Meds  Medication Sig   allopurinol (ZYLOPRIM) 100 MG tablet Take 1 tablet (100 mg total) by mouth daily.   apixaban (ELIQUIS) 5 MG TABS tablet TAKE ONE TABLET BY MOUTH TWICE DAILY   atenolol (TENORMIN) 25 MG tablet TAKE 1 TABLET BY MOUTH TWICE DAILY.   colchicine 0.6 MG tablet Take 1 tablet (0.6 mg total) by mouth daily. Take for gout flare.   doxycycline (VIBRAMYCIN) 100 MG capsule    esomeprazole (NEXIUM) 20 MG capsule Take 20 mg by mouth daily as needed (heartburn).   hydrochlorothiazide (HYDRODIURIL) 12.5 MG tablet Take 1 tablet (12.5 mg total) by mouth daily.   losartan (COZAAR) 100 MG tablet Take 1 tablet (100 mg total) by mouth daily.   Multiple Vitamins-Calcium (DAILY VITAMINS FOR WOMEN PO) Take 1 tablet by mouth daily.   pravastatin (PRAVACHOL) 10 MG tablet Take 1 tablet (10 mg total) by mouth daily.   Current Facility-Administered Medications for the 04/09/22 encounter (Office Visit) with Loel Dubonnet, NP  Medication   dexamethasone (DECADRON) injection 4 mg     Review of Systems      All other systems reviewed and are otherwise negative except as noted above.  Physical Exam    VS:  BP 134/80   Pulse 71   Ht '5\' 3"'$  (1.6 m)   Wt 135 lb (61.2 kg)   BMI 23.91 kg/m  , BMI Body mass index is 23.91 kg/m.  Wt Readings from Last 3 Encounters:  04/09/22 135 lb (61.2 kg)  12/30/21 136 lb (61.7 kg)  10/14/21 137 lb (62.1 kg)    GEN: Well nourished, well developed, in no acute distress. HEENT: normal. Neck: Supple, no JVD, carotid bruits, or masses. Cardiac: RRR, no murmurs, rubs, or gallops. No clubbing, cyanosis, edema.   Radials/PT 2+ and equal bilaterally.  Respiratory:  Respirations regular and unlabored, clear to auscultation bilaterally. GI: Soft, nontender, nondistended. MS: No deformity or atrophy. Skin: Warm and dry, no rash. Neuro:  Strength and sensation are intact. Psych: Normal affect.  Assessment & Plan    Permanent atrial fibrillation/hypercoagulable state -rate controlled today on atenolol 25 mg twice daily. CHA2DS2-VASc Score = 5 [CHF History: 0, HTN History: 1, Diabetes History: 1, Stroke History: 0, Vascular Disease History: 0, Age Score: 2, Gender Score: 1].  Therefore, the patient's annual risk of stroke is 7.2 %.    Continue Eliquis 5 mg twice daily.  Does not meet dose reduction criteria.  Hypertension-recent ED visit for hypertension. BP elevated at home 150s-170s on arm cuff. She will bring cuff to nurse visit in 1 week to assess accuracy. Stop losartan, start Valsartan '160mg'$  QD. If BP remains elevated could up titrate. BMP in 1 week. Continue HCTZ 12.'5mg'$  QD.  Thyroid nodule - 57m by recent CT. Thyroid ultrasound and TSH/T3/T4 ordered. If abnormal, refer to PCP vs endocrinology.   Hypokalemia - 04/07/22 K 3.4 in ED. PO repletion provided. BMP in 1 week for monitoring.  Persistent hypokalemia may require regular potassium usage due to HCTZ.  Hyperlipidemia -continue pravastatin.        Disposition: Follow up in 3-4 week(s) with PJenkins Rouge MD or APP.  Signed, CLoel Dubonnet NP 04/09/2022, 9:22 AM [place]

## 2022-04-08 NOTE — ED Provider Notes (Signed)
[facility] EMERGENCY DEPARTMENT Provider Note   CSN: [id number] Arrival date & time: 04/07/22  1922     History  Chief Complaint  Patient presents with   Hypertension   Headache    Alicia Rodgers is a 76 y.o. female.  The history is provided by the patient and medical records.  Hypertension Associated symptoms include headaches.  Headache Alicia Rodgers is a 76 y.o. female who presents to the Emergency Department complaining of hypertension, headache.  She presents to the emergency department for evaluation of elevated blood pressure that she noticed this morning.  When she woke up she felt lightheaded and a little unsteady so she checked her blood pressure and found that it was elevated to 782 systolic.  She took her morning medication and her symptoms and blood pressure improved.  Later this afternoon she began to feel unwell again with pain to the back of her neck and the left side of her head and her blood pressure increased again.  She no longer feels lightheaded.  No associated vision changes, chest pain, shortness of breath, numbness, weakness.  Normal BP 138-142  Atenolol '25mg'$  bid Elkiquis '5mg'$  bid Losartan '100mg'$  daily Hctz 12.5       Home Medications Prior to Admission medications   Medication Sig Start Date End Date Taking? Authorizing Provider  allopurinol (ZYLOPRIM) 100 MG tablet Take 1 tablet (100 mg total) by mouth daily. 10/14/21   Rice, Resa Miner, MD  apixaban (ELIQUIS) 5 MG TABS tablet TAKE ONE TABLET BY MOUTH TWICE DAILY 09/15/21   Josue Hector, MD  atenolol (TENORMIN) 25 MG tablet TAKE 1 TABLET BY MOUTH TWICE DAILY. 07/03/21   Josue Hector, MD  colchicine 0.6 MG tablet Take 1 tablet (0.6 mg total) by mouth daily. Take for gout flare. 04/25/21   Bronson Ing, DPM  doxycycline (VIBRAMYCIN) 100 MG capsule  09/12/20   [provider]  esomeprazole (NEXIUM) 20 MG capsule Take 20 mg by mouth daily as needed (heartburn).     [provider]  hydrochlorothiazide (HYDRODIURIL) 12.5 MG tablet Take 1 tablet (12.5 mg total) by mouth daily. 01/05/22   Josue Hector, MD  losartan (COZAAR) 100 MG tablet Take 1 tablet (100 mg total) by mouth daily. 01/05/22   Josue Hector, MD  Multiple Vitamins-Calcium (DAILY VITAMINS FOR WOMEN PO) Take 1 tablet by mouth daily.    [provider]  pravastatin (PRAVACHOL) 10 MG tablet Take 1 tablet (10 mg total) by mouth daily. 01/05/22   Josue Hector, MD      Allergies    Ciprofloxacin and Penicillins    Review of Systems   Review of Systems  Neurological:  Positive for headaches.  All other systems reviewed and are negative.   Physical Exam Updated Vital Signs BP (!) 176/88   Pulse (!) 56   Temp (!) 97.4 F (36.3 C) (Oral)   Resp 17   SpO2 100%  Physical Exam Vitals and nursing note reviewed.  Constitutional:      Appearance: She is well-developed.  HENT:     Head: Normocephalic and atraumatic.  Cardiovascular:     Rate and Rhythm: Normal rate. Rhythm irregular.     Heart sounds: No murmur heard. Pulmonary:     Effort: Pulmonary effort is normal. No respiratory distress.     Breath sounds: Normal breath sounds.  Abdominal:     Palpations: Abdomen is soft.     Tenderness: There is no  abdominal tenderness. There is no guarding or rebound.  Musculoskeletal:        General: No tenderness.  Skin:    General: Skin is warm and dry.  Neurological:     Mental Status: She is alert and oriented to person, place, and time.     Comments: No asymmetry of facial movements.  Visual fields grossly intact.  5 out of 5 strength in all 4 extremities.  No ataxia on finger-to-nose bilaterally  Psychiatric:        Behavior: Behavior normal.     ED Results / Procedures / Treatments   Labs (all labs ordered are listed, but only abnormal results are displayed) Labs Reviewed  BASIC METABOLIC PANEL - Abnormal; Notable for the following components:      Result  Value   Potassium 3.4 (*)    Glucose, Bld 125 (*)    Creatinine, Ser 1.02 (*)    GFR, Estimated 57 (*)    All other components within normal limits  URINALYSIS, ROUTINE W REFLEX MICROSCOPIC - Abnormal; Notable for the following components:   Color, Urine STRAW (*)    Leukocytes,Ua TRACE (*)    All other components within normal limits  CBC WITH DIFFERENTIAL/PLATELET    EKG EKG Interpretation  Date/Time:  Wednesday April 08 2022 05:00:00 EDT Ventricular Rate:  55 PR Interval:    QRS Duration: 97 QT Interval:  468 QTC Calculation: 448 R Axis:   76 Text Interpretation: Atrial fibrillation Borderline ST depression, diffuse leads ST changes improved compared to prior Confirmed by Quintella Reichert [phone]) on 04/08/2022 5:33:02 AM  Radiology MR BRAIN WO CONTRAST  Result Date: 04/08/2022 CLINICAL DATA:  76 year old female with hypertension, left side headache, neck pain. EXAM: MRI HEAD WITHOUT CONTRAST TECHNIQUE: Multiplanar, multiecho pulse sequences of the brain and surrounding structures were obtained without intravenous contrast. COMPARISON:  Brain MRI [facility] 04/11/2005. CTA head and neck yesterday. FINDINGS: Brain: Cerebral volume is within normal limits for age. No restricted diffusion to suggest acute infarction. No midline shift, mass effect, evidence of mass lesion, ventriculomegaly, extra-axial collection or acute intracranial hemorrhage. Cervicomedullary junction and pituitary are within normal limits. Progressed chronic patchy bilateral cerebral white matter T2 and FLAIR hyperintensity, now confluent in the left anterior centrum semiovale and corona radiata. Deep gray matter nuclei relatively spared. Incidental developmental venous anomaly left middle frontal gyrus, confirmed on postcontrast images in 2006 (series 14, image 31 today). No chronic cerebral blood products. No definite cortical encephalomalacia. Brainstem and cerebellum appear stable and negative. Vascular:  Major intracranial vascular flow voids are stable since 2006. Skull and upper cervical spine: Negative for age visible cervical spine. Visualized bone marrow signal is within normal limits. Sinuses/Orbits: Postoperative changes to both globes since 2006. Paranasal sinuses and mastoids are stable and well aerated. Trace chronic right mastoid fluid appears inconsequential. Other: Grossly normal visible internal auditory structures. Negative visible scalp and face. IMPRESSION: 1. No acute intracranial abnormality. 2. Progressed chronic cerebral white matter signal changes since 2006, moderate for age and most commonly due to chronic small vessel disease. 3. Incidental left frontal lobe developmental venous anomaly (normal variant). Electronically Signed   By: Genevie Ann M.D.   On: 04/08/2022 06:16   CT ANGIO HEAD NECK W WO CM  Result Date: 04/07/2022 CLINICAL DATA:  Acute neurologic deficit EXAM: CT ANGIOGRAPHY HEAD AND NECK TECHNIQUE: Multidetector CT imaging of the head and neck was performed using the standard protocol during bolus administration of intravenous contrast. Multiplanar CT image reconstructions  and MIPs were obtained to evaluate the vascular anatomy. Carotid stenosis measurements (when applicable) are obtained utilizing NASCET criteria, using the distal internal carotid diameter as the denominator. RADIATION DOSE REDUCTION: This exam was performed according to the departmental dose-optimization program which includes automated exposure control, adjustment of the mA and/or kV according to patient size and/or use of iterative reconstruction technique. CONTRAST:  32m OMNIPAQUE IOHEXOL 350 MG/ML SOLN COMPARISON:  None Available. FINDINGS: CT HEAD FINDINGS Brain: There is no mass, hemorrhage or extra-axial collection. The size and configuration of the ventricles and extra-axial CSF spaces are normal. There is hypoattenuation of the periventricular white matter, most commonly indicating chronic ischemic  microangiopathy. Skull: The visualized skull base, calvarium and extracranial soft tissues are normal. Sinuses/Orbits: No fluid levels or advanced mucosal thickening of the visualized paranasal sinuses. No mastoid or middle ear effusion. The orbits are normal. CTA NECK FINDINGS SKELETON: There is no bony spinal canal stenosis. No lytic or blastic lesion. OTHER NECK: Heterogeneous right thyroid nodule measures 2.0 cm. UPPER CHEST: No pneumothorax or pleural effusion. No nodules or masses. AORTIC ARCH: There is no calcific atherosclerosis of the aortic arch. There is no aneurysm, dissection or hemodynamically significant stenosis of the visualized portion of the aorta. Conventional 3 vessel aortic branching pattern. The visualized proximal subclavian arteries are widely patent. RIGHT CAROTID SYSTEM: Normal without aneurysm, dissection or stenosis. LEFT CAROTID SYSTEM: Normal without aneurysm, dissection or stenosis. VERTEBRAL ARTERIES: Left dominant configuration. Both origins are clearly patent. There is no dissection, occlusion or flow-limiting stenosis to the skull base (V1-V3 segments). CTA HEAD FINDINGS POSTERIOR CIRCULATION: --Vertebral arteries: Normal V4 segments. --Inferior cerebellar arteries: Normal. --Basilar artery: Normal. --Superior cerebellar arteries: Normal. --Posterior cerebral arteries (PCA): Normal. ANTERIOR CIRCULATION: --Intracranial internal carotid arteries: Normal. --Anterior cerebral arteries (ACA): Normal. Both A1 segments are present. Patent anterior communicating artery (a-comm). --Middle cerebral arteries (MCA): Normal. VENOUS SINUSES: As permitted by contrast timing, patent. ANATOMIC VARIANTS: None Review of the MIP images confirms the above findings. IMPRESSION: 1. No emergent large vessel occlusion or hemodynamically significant stenosis of the head or neck. 2. A 2 cm incidental right thyroid nodule. Recommend nonemergent thyroid [place] Reference: J Am Coll Radiol. 2015 Feb;12(2): 143-50  Electronically Signed   By: KUlyses JarredM.D.   On: 04/07/2022 22:48    Procedures Procedures    Medications Ordered in ED Medications  acetaminophen (TYLENOL) tablet 650 mg (has no administration in time range)  potassium chloride SA (KLOR-CON M) CR tablet 20 mEq (has no administration in time range)  iohexol (OMNIPAQUE) 350 MG/ML injection 75 mL (75 mLs Intravenous Contrast Given 04/07/22 2157)  atenolol (TENORMIN) tablet 25 mg (25 mg Oral Given 04/08/22 0448)    ED Course/ Medical Decision Making/ A&P                           Medical Decision Making Amount and/or Complexity of Data Reviewed Radiology: ordered.  Risk OTC drugs. Prescription drug management.   Patient with history of hypertension, persistent atrial fibrillation, CKD here for evaluation of elevated blood pressures, had some dizziness/unwell feeling this morning as well as pain to the back of her neck and left side of her head.  Patient hypertensive in the emergency department with no focal neurologic deficits.  CTA is negative for acute abnormality.  Given patient's complaints of dizziness a MRI was obtained, which is negative for acute abnormality.  On repeat evaluation patient states she is asymptomatic.  Her  blood pressure did improve with one of her home medications that was missed in the evening.  Plan to discharge home with continuing her current home medications with outpatient follow-up and return precautions.  Presentation is not consistent with acute CVA, hypertensive urgency.       Final Clinical Impression(s) / ED Diagnoses Final diagnoses:  Elevated blood pressure reading  Dizzy  Thyroid nodule    Rx / DC Orders ED Discharge Orders     None         Quintella Reichert, MD 04/08/22 [phone]

## 2022-04-08 NOTE — ED Notes (Signed)
Patient declined tylenol at this time.

## 2022-04-08 NOTE — ED Notes (Signed)
Back from MRI.

## 2022-04-08 NOTE — Progress Notes (Deleted)
Cardiology Office Note:    Date:  04/08/2022   ID:  Alicia, Rodgers [birth year], MRN [id number]  PCP:  [facility] [place] Providers Cardiologist:  Alicia Rouge, MD { Click to update primary MD,subspecialty MD or APP then REFRESH:1}    Referring MD: Alicia Nip, MD   Chief Complaint: ***  History of Present Illness:    Alicia Rodgers is a *** 76 y.o. female with a hx of HTN, type 2 DM, gout, HLD, murmur from rheumatic fever advised to take antibiotics for SBE prophylaxis,   Atrial fibrillation was diagnosed on office visit 08/25/2018.  TTE normal EF, LA 44 mm.  She underwent DCCV 10/06/2018 with conversion but early return of atrial fibrillation.  Decision made to rate control.  On Eliquis for CHA2DS2-VASc score of 3.   She was last seen in our office on 12/30/2021 by Dr. Johnsie Cancel.  Plan to continue rate control and anticoagulation for permanent atrial fibrillation. LDL elevated at 111. Plan to follow-up in 6 months.   ED visit 04/07/22 for elevated blood pressure 540G systolic.  She awoke that morning feeling a little lightheaded and unsteady, found BP to be elevated to 867 systolic.  She took her morning medication and symptoms and BP improved.  Later that afternoon she began to feel unwell again with pain to the back of her neck and left side of her head and her BP increased again. Usual home SBP 138/142 on atenolol, losartan, and HCTZ. EKG showed atrial fibrillation at 55 bpm. Due to c/o headache, MRI and CT were obtained and both were negative for acute abnormality. She was discharged home with plan for follow-up.   Today, she    Past Medical History:  Diagnosis Date   Anemia    Atrial fibrillation ([place])    CERVICAL CANCER, HX OF    Fibrocystic breast    HYPERLIPIDEMIA    HYPERTENSION    Peripheral neuropathy    RHEUMATIC FEVER, HX OF     Past Surgical History:  Procedure Laterality Date   APPENDECTOMY     BREAST CYST EXCISION      BREAST EXCISIONAL BIOPSY Left    benign   CARDIOVERSION N/A 10/06/2018   Procedure: CARDIOVERSION;  Surgeon: Josue Hector, MD;  Location: [facility];  Service: Cardiovascular;  Laterality: N/A;   CESAREAN SECTION     x3   TONSILLECTOMY     TOTAL ABDOMINAL HYSTERECTOMY      Current Medications: Current Facility-Administered Medications for the 04/08/22 encounter (Appointment) with Alicia Life, NP  Medication   dexamethasone (DECADRON) injection 4 mg   No outpatient medications have been marked as taking for the 04/08/22 encounter (Appointment) with Alicia Life, NP.     Allergies:   Ciprofloxacin and Penicillins   Social History   Socioeconomic History   Marital status: Married    Spouse name: Not on file   Number of children: 3   Years of education: 1 yr coll   Highest education level: Not on file  Occupational History   Occupation: Optometrist    Comment: Retired  Tobacco Use   Smoking status: Never   Smokeless tobacco: Never  Scientific laboratory technician Use: Never used  Substance and Sexual Activity   Alcohol use: No   Drug use: No   Sexual activity: Not on file  Other Topics Concern   Not on file  Social History Narrative   Lives with her  daughter and her family.   Right-handed.   1 cup caffeine daily.   Social Determinants of Health   Financial Resource Strain: Not on file  Food Insecurity: Not on file  Transportation Needs: Not on file  Physical Activity: Not on file  Stress: Not on file  Social Connections: Not on file     Family History: The patient's ***family history includes Breast cancer in her maternal aunt and paternal aunt; Cancer in her maternal grandmother; Heart disease in her father and maternal grandfather; Hyperlipidemia in her brother; Hypertension in her brother; Stroke in her mother and paternal grandfather; Thyroid cancer in her paternal aunt.  ROS:   Please see the history of present illness.    *** All other systems  reviewed and are negative.  Labs/Other Studies Reviewed:    The following studies were reviewed today:  Echo 09/01/18  Left ventricle: The cavity size was normal. Systolic function was    normal. The estimated ejection fraction was in the range of 60%    to 65%. Wall motion was normal; there were no regional wall    motion abnormalities.  - Mitral valve: Valve area by continuity equation (using LVOT    flow): 2.52 cm^2.  - Left atrium: The atrium was severely dilated.  - Right atrium: The atrium was mildly dilated.  - Pulmonary arteries: Systolic pressure was within the normal    range. PA peak pressure: 34 mm Hg (S).    Recent Labs: 12/30/2021: ALT 19 04/07/2022: BUN 18; Creatinine, Ser 1.02; Hemoglobin 14.1; Platelets 205; Potassium 3.4; Sodium 139  Recent Lipid Panel    Component Value Date/Time   CHOL 183 12/30/2021 0900   TRIG 97 12/30/2021 0900   HDL 54 12/30/2021 0900   CHOLHDL 3.4 12/30/2021 0900   LDLCALC 111 (H) 12/30/2021 0900     Risk Assessment/Calculations:   {Does this patient have ATRIAL FIBRILLATION?[phone]}       Physical Exam:    VS:  There were no vitals taken for this visit.    Wt Readings from Last 3 Encounters:  12/30/21 136 lb (61.7 kg)  10/14/21 137 lb (62.1 kg)  07/15/21 136 lb (61.7 kg)     GEN: *** Well nourished, well developed in no acute distress HEENT: Normal NECK: No JVD; No carotid bruits CARDIAC: ***RRR, no murmurs, rubs, gallops RESPIRATORY:  Clear to auscultation without rales, wheezing or rhonchi  ABDOMEN: Soft, non-tender, non-distended MUSCULOSKELETAL:  No edema; No deformity. *** pedal pulses, ***bilaterally SKIN: Warm and dry NEUROLOGIC:  Alert and oriented x 3 PSYCHIATRIC:  Normal affect   EKG:  EKG is *** ordered today.  The ekg ordered today demonstrates ***       Diagnoses:    No diagnosis found. Assessment and Plan:       {Are you ordering a CV Procedure (e.g. stress test, cath, DCCV, TEE, etc)?    Press F2        :[id number]}   Disposition:  Medication Adjustments/Labs and Tests Ordered: Current medicines are reviewed at length with the patient today.  Concerns regarding medicines are outlined above.  No orders of the defined types were placed in this encounter.  No orders of the defined types were placed in this encounter.   There are no Patient Instructions on file for this visit.   Signed, Alicia Life, NP  04/08/2022 6:02 AM    [place]

## 2022-04-09 ENCOUNTER — Ambulatory Visit (INDEPENDENT_AMBULATORY_CARE_PROVIDER_SITE_OTHER): Payer: Medicare Other | Admitting: Family

## 2022-04-09 ENCOUNTER — Encounter (HOSPITAL_BASED_OUTPATIENT_CLINIC_OR_DEPARTMENT_OTHER): Payer: Self-pay | Admitting: Family

## 2022-04-09 ENCOUNTER — Other Ambulatory Visit (HOSPITAL_BASED_OUTPATIENT_CLINIC_OR_DEPARTMENT_OTHER): Payer: Self-pay

## 2022-04-09 VITALS — BP 134/80 | HR 71 | Ht 63.0 in | Wt 135.0 lb

## 2022-04-09 DIAGNOSIS — I10 Essential (primary) hypertension: Secondary | ICD-10-CM | POA: Diagnosis not present

## 2022-04-09 DIAGNOSIS — E782 Mixed hyperlipidemia: Secondary | ICD-10-CM

## 2022-04-09 DIAGNOSIS — E876 Hypokalemia: Secondary | ICD-10-CM | POA: Diagnosis not present

## 2022-04-09 DIAGNOSIS — E041 Nontoxic single thyroid nodule: Secondary | ICD-10-CM | POA: Diagnosis not present

## 2022-04-09 DIAGNOSIS — D6859 Other primary thrombophilia: Secondary | ICD-10-CM | POA: Diagnosis not present

## 2022-04-09 DIAGNOSIS — I482 Chronic atrial fibrillation, unspecified: Secondary | ICD-10-CM

## 2022-04-09 MED ORDER — VALSARTAN 160 MG PO TABS: 160.0000 mg | ORAL_TABLET | Freq: Every day | ORAL | 2 refills | Status: DC

## 2022-04-09 NOTE — Patient Instructions (Signed)
Medication Instructions:  Your physician has recommended you make the following change in your medication:  Stop: Losartan   Start: valsartan '160mg'$  daily   *If you need a refill on your cardiac medications before your next appointment, please call your pharmacy*   Lab Work: Please return for Lab work either next Tuesday or Thursday for BMP and Thyroid panel. We will also have you scheduled for a Nurse Visit this same day to do a blood pressure check.  You may come to the...   [facility] (3rd floor) [address] [place], [place] [address]  Open: 8am-Noon and 1pm-4:30pm  Please ring the doorbell on the small table when you exit the elevator and the Lab Tech will come get you  [place] [facility] [address] [place], [place], [place] [address] Open: 8am-1pm, then 2pm-4:30pm   [place]- Please see attached locations sheet stapled to your lab work with address and hours.   If you have labs (blood work) drawn today and your tests are completely normal, you will receive your results only by: [place] (if you have MyChart) OR A paper copy in the mail If you have any lab test that is abnormal or we need to change your treatment, we will call you to review the results.   Testing/Procedures: Your provider has recommended that you have a thyroid ultrasound. You can call and make an appointment downstairs or they take walk-ins!    Follow-Up: At [facility], you and your health needs are our priority.  As part of our continuing mission to provide you with exceptional heart care, we have created designated Provider Care Teams.  These Care Teams include your primary Cardiologist (physician) and Advanced Practice Providers (APPs -  Physician Assistants and Nurse Practitioners) who all work together to provide you with the care you need, when you need it.  We recommend signing up for the patient portal called "MyChart".  Sign up  information is provided on this After Visit Summary.  MyChart is used to connect with patients for Virtual Visits (Telemedicine).  Patients are able to view lab/test results, encounter notes, upcoming appointments, etc.  Non-urgent messages can be sent to your provider as well.   To learn more about what you can do with MyChart, go to [email or web address].    Your next appointment:   Nurse visit with Gerald Stabs, RN either Tuesday 8/29 or Thursday 8/31 at 1pm   Follow Up with Laurann Montana, NP in 3-4 weeks   Other Instructions Exercise recommendations: The American Heart Association recommends 150 minutes of moderate intensity exercise weekly. Try 30 minutes of moderate intensity exercise 4-5 times per week. This could include walking, jogging, or swimming.  Heart Healthy Diet Recommendations: A low-salt diet is recommended. Meats should be grilled, baked, or boiled. Avoid fried foods. Focus on lean protein sources like fish or chicken with vegetables and fruits. The American Heart Association is a Microbiologist!  American Heart Association Diet and Lifeystyle Recommendations    Important Information About Sugar

## 2022-04-10 ENCOUNTER — Ambulatory Visit (HOSPITAL_BASED_OUTPATIENT_CLINIC_OR_DEPARTMENT_OTHER): Admission: RE | Admit: 2022-04-10 | Payer: Medicare Other | Source: Ambulatory Visit

## 2022-04-14 ENCOUNTER — Telehealth (HOSPITAL_BASED_OUTPATIENT_CLINIC_OR_DEPARTMENT_OTHER): Payer: Self-pay

## 2022-04-14 ENCOUNTER — Ambulatory Visit (INDEPENDENT_AMBULATORY_CARE_PROVIDER_SITE_OTHER): Payer: Medicare Other

## 2022-04-14 ENCOUNTER — Ambulatory Visit (HOSPITAL_BASED_OUTPATIENT_CLINIC_OR_DEPARTMENT_OTHER)
Admission: RE | Admit: 2022-04-14 | Discharge: 2022-04-14 | Disposition: A | Discharge status: Home / Self Care | Payer: Medicare Other | Source: Ambulatory Visit | Attending: Family | Admitting: Family

## 2022-04-14 ENCOUNTER — Encounter (HOSPITAL_BASED_OUTPATIENT_CLINIC_OR_DEPARTMENT_OTHER): Payer: Self-pay

## 2022-04-14 VITALS — BP 172/87 | HR 61 | Ht 63.0 in | Wt 137.0 lb

## 2022-04-14 DIAGNOSIS — E876 Hypokalemia: Secondary | ICD-10-CM | POA: Diagnosis not present

## 2022-04-14 DIAGNOSIS — I10 Essential (primary) hypertension: Secondary | ICD-10-CM | POA: Diagnosis not present

## 2022-04-14 DIAGNOSIS — E041 Nontoxic single thyroid nodule: Secondary | ICD-10-CM

## 2022-04-14 DIAGNOSIS — I482 Chronic atrial fibrillation, unspecified: Secondary | ICD-10-CM | POA: Diagnosis not present

## 2022-04-14 NOTE — Patient Instructions (Signed)
Medication Instructions:  Your Physician recommend you continue on your current medication as directed.    We may change meds tomorrow based on your lab results! We will call you!   *If you need a refill on your cardiac medications before your next appointment, please call your pharmacy*   Lab Work: Labs today!   If you have labs (blood work) drawn today and your tests are completely normal, you will receive your results only by: [place] (if you have MyChart) OR A paper copy in the mail If you have any lab test that is abnormal or we need to change your treatment, we will call you to review the results.  Follow-Up: At [facility], you and your health needs are our priority.  As part of our continuing mission to provide you with exceptional heart care, we have created designated Provider Care Teams.  These Care Teams include your primary Cardiologist (physician) and Advanced Practice Providers (APPs -  Physician Assistants and Nurse Practitioners) who all work together to provide you with the care you need, when you need it.  We recommend signing up for the patient portal called "MyChart".  Sign up information is provided on this After Visit Summary.  MyChart is used to connect with patients for Virtual Visits (Telemedicine).  Patients are able to view lab/test results, encounter notes, upcoming appointments, etc.  Non-urgent messages can be sent to your provider as well.   To learn more about what you can do with MyChart, go to [email or web address].    Your next appointment:   Follow Up as scheduled   Other Instructions Your blood pressure cuff reads 14 points high on top and 12 points high on the bottoms. Keep this in mind!   Important Information About Sugar

## 2022-04-14 NOTE — Progress Notes (Signed)
   Nurse Visit   Date of Encounter: 04/14/2022 ID: Alicia Rodgers, DOB 1945/12/25, MRN [id number]  PCP:  [facility] [place] Prov[facility] Cardiologist:  [name], MD      Visit Details   VS:  BP (!) 172/87 Comment: home cuff left arm  Pulse 61   Ht '5\' 3"'$  (1.6 m)   Wt 137 lb (62.1 kg)   BMI 24.27 kg/m  , BMI Body mass index is 24.27 kg/m.  Wt Readings from Last 3 Encounters:  04/14/22 137 lb (62.1 kg)  04/09/22 135 lb (61.2 kg)  12/30/21 136 lb (61.7 kg)     Reason for visit: BP check in and Blood pressure cuff comparison Performed today: Vitals, EKG, Provider consulted: Hoy Morn , and Education Changes (medications, testing, etc.) : No changes today- We will wait until lab results come back tomorrow  Length of Visit: 10 minutes    Medications Adjustments/Labs and Tests Ordered: Patient presents today for Blood pressure check-in and home blood pressure cuff comparison. Patient home cuff reads 14 points higher on top and 12 points higher on bottom. Advised patient of this difference and to take into account when she checks her BP at home. Consulted with Laurann Montana, NP to advise for no changes after lab results come back. Patient very appreciative of visit today.    Signed, Gerald Stabs, RN  04/14/2022 12:41 PM

## 2022-04-14 NOTE — Addendum Note (Signed)
Addended by: Gerald Stabs on: 04/14/2022 12:48 PM   Modules accepted: Level of Service

## 2022-04-14 NOTE — Telephone Encounter (Addendum)
Results called to patient who verbalizes understanding!    ----- Message from Loel Dubonnet, NP sent at 04/14/2022  4:13 PM EDT ----- Your ultrasound with three nodules in the thyroid.  Recommend referral to an endocrinologist for further evaluation.

## 2022-04-15 LAB — BASIC METABOLIC PANEL
BUN/Creatinine Ratio: 21 (ref 12–28)
BUN: 22 mg/dL (ref 8–27)
CO2: 23 mmol/L (ref 20–29)
Calcium: 10.1 mg/dL (ref 8.7–10.3)
Chloride: 102 mmol/L (ref 96–106)
Creatinine, Ser: 1.03 mg/dL — ABNORMAL HIGH (ref 0.57–1.00)
Glucose: 101 mg/dL — ABNORMAL HIGH (ref 70–99)
Potassium: 4 mmol/L (ref 3.5–5.2)
Sodium: 140 mmol/L (ref 134–144)
eGFR: 56 mL/min/{1.73_m2} — ABNORMAL LOW (ref >59)

## 2022-04-15 LAB — THYROID PANEL WITH TSH
Free Thyroxine Index: 1.9 (ref 1.2–4.9)
T3 Uptake Ratio: 29 % (ref 24–39)
T4, Total: 6.7 ug/dL (ref 4.5–12.0)
TSH: 1.53 u[IU]/mL (ref 0.450–4.500)

## 2022-04-16 ENCOUNTER — Other Ambulatory Visit: Payer: Self-pay | Admitting: Cardiovascular Disease

## 2022-04-16 DIAGNOSIS — I482 Chronic atrial fibrillation, unspecified: Secondary | ICD-10-CM

## 2022-04-16 NOTE — Telephone Encounter (Signed)
Eliquis '5mg'$  refill request received. Patient is 76 years old, weight-62.1kg, Crea-1.03 on 04/14/2022, Diagnosis-Afib, and last seen by Juanetta Gosling on  04/09/2022. Dose is appropriate based on dosing criteria. Will send in refill to requested pharmacy.

## 2022-04-29 ENCOUNTER — Ambulatory Visit (INDEPENDENT_AMBULATORY_CARE_PROVIDER_SITE_OTHER): Payer: Medicare Other | Admitting: Family

## 2022-04-29 ENCOUNTER — Encounter (HOSPITAL_BASED_OUTPATIENT_CLINIC_OR_DEPARTMENT_OTHER): Payer: Self-pay | Admitting: Family

## 2022-04-29 VITALS — BP 148/78 | HR 66 | Ht 63.0 in | Wt 137.0 lb

## 2022-04-29 DIAGNOSIS — I10 Essential (primary) hypertension: Secondary | ICD-10-CM | POA: Diagnosis not present

## 2022-04-29 DIAGNOSIS — D6859 Other primary thrombophilia: Secondary | ICD-10-CM

## 2022-04-29 DIAGNOSIS — E041 Nontoxic single thyroid nodule: Secondary | ICD-10-CM

## 2022-04-29 DIAGNOSIS — E782 Mixed hyperlipidemia: Secondary | ICD-10-CM | POA: Diagnosis not present

## 2022-04-29 MED ORDER — VALSARTAN 320 MG PO TABS: 320.0000 mg | ORAL_TABLET | Freq: Every day | ORAL | 3 refills | Status: DC

## 2022-04-29 NOTE — Patient Instructions (Addendum)
Medication Instructions:  Your physician has recommended you make the following change in your medication:   INCREASE Valsartan to '320mg'$  daily *this change was made to help your blood pressure get to goal of less than 130/80   *If you need a refill on your cardiac medications before your next appointment, please call your pharmacy*  Follow-Up: At [facility], you and your health needs are our priority.  As part of our continuing mission to provide you with exceptional heart care, we have created designated Provider Care Teams.  These Care Teams include your primary Cardiologist (physician) and Advanced Practice Providers (APPs -  Physician Assistants and Nurse Practitioners) who all work together to provide you with the care you need, when you need it.  We recommend signing up for the patient portal called "MyChart".  Sign up information is provided on this After Visit Summary.  MyChart is used to connect with patients for Virtual Visits (Telemedicine).  Patients are able to view lab/test results, encounter notes, upcoming appointments, etc.  Non-urgent messages can be sent to your provider as well.   To learn more about what you can do with MyChart, go to [email or web address].    Your next appointment:   As scheduled with Dr. Johnsie Cancel in November  _______ Recommend calling Endocrinology regarding you referral due to thyroid nodule:  [phone] We also sent a referral to [facility] Endocrinology in case they can get you in sooner. Their phone number is [phone]. Whichever can get you in sooner is great! _______ Recommend establishing with primary care. Emeterio Reeve Early, NP is a Ship broker located in [facility]. Her phone number to schedule is [phone]. Other Instructions  Tips to Measure your Blood Pressure Correctly  Here's what you can do to ensure a correct reading:  Don't drink a caffeinated beverage or smoke during the 30  minutes before the test.  Sit quietly for five minutes before the test begins.  During the measurement, sit in a chair with your feet on the floor and your arm supported so your elbow is at about heart level.  The inflatable part of the cuff should completely cover at least 80% of your upper arm, and the cuff should be placed on bare skin, not over a shirt.  Don't talk during the measurement.   Blood pressure categories  Blood pressure category SYSTOLIC (upper number)  DIASTOLIC (lower number)  Normal Less than 120 mm Hg and Less than 80 mm Hg  Elevated 120-129 mm Hg and Less than 80 mm Hg  High blood pressure: Stage 1 hypertension 130-139 mm Hg or 80-89 mm Hg  High blood pressure: Stage 2 hypertension 140 mm Hg or higher or 90 mm Hg or higher  Hypertensive crisis (consult your doctor immediately) Higher than 180 mm Hg and/or Higher than 120 mm Hg  Source: American Heart Association and American Stroke Association. For more on getting your blood pressure under control, buy Controlling Your Blood Pressure, a Special Health Report from [facility].   Blood Pressure Log   Date   Time  Blood Pressure  Example: Nov 1 9 AM 124/78

## 2022-04-29 NOTE — Progress Notes (Signed)
Office Visit    Patient Name: Alicia Rodgers Date of Encounter: 04/30/2022  PCP:  [facility] [place]  Cardiologist:  [name], MD  Advanced Practice Provider:  No care team member to display Electrophysiologist:  None      Chief Complaint    Alicia Rodgers is a 76 y.o. female presents today for hypertension follow up   Past Medical History    Past Medical History:  Diagnosis Date   Anemia    Atrial fibrillation ([place])    CERVICAL CANCER, HX OF    Fibrocystic breast    HYPERLIPIDEMIA    HYPERTENSION    Peripheral neuropathy    RHEUMATIC FEVER, HX OF    Past Surgical History:  Procedure Laterality Date   APPENDECTOMY     BREAST CYST EXCISION     BREAST EXCISIONAL BIOPSY Left    benign   CARDIOVERSION N/A 10/06/2018   Procedure: CARDIOVERSION;  Surgeon: Josue Hector, MD;  Location: [facility] END[facility];  Service: Cardiovascular;  Laterality: N/A;   CESAREAN SECTION     x3   TONSILLECTOMY     TOTAL ABDOMINAL HYSTERECTOMY      Allergies  Allergies  Allergen Reactions   Ciprofloxacin Other (See Comments)   Penicillins Swelling and Rash    Did it involve swelling of the face/tongue/throat, SOB, or low BP? No Did it involve sudden or severe rash/hives, skin peeling, or any reaction on the inside of your mouth or nose? No Did you need to seek medical attention at a hospital or doctor's office? No When did it last happen?      40 years ago If all above answers are "NO", may proceed with cephalosporin use.      History of Present Illness    Alicia Rodgers is a 76 y.o. female with a hx of atrial fibrillation, hypertension, hyperlipidemia, gout, COVID-19 07/2019 not requiring hospitalization, rheumatic fever last seen 04/09/22.  Atrial fibrillation diagnosed during office visit January 2020.  TTE at that time with normal LVEF.  Underwent DCCV 09/2018 for early return of atrial fibrillation with decision made to  rate control.    Last seen 12/30/2021.  She is doing well from a cardiac perspective.  Labs updated and recommended follow-up in 6 months.  ED visit 04/07/2022 for hypertension, headache.  BP at home 696 systolic, took her morning medication, symptoms and blood pressure improved.  CTA and MRI negative for acute abnormality. CT with inadvertent finding of 2 mm thyroid nodule recommended for ultrasound. Of note blood pressure improved with one of her medications that was missed in the evening.  Follow-up 04/09/2022 her losartan was stopped and started on valsartan 160 mg daily.  Thyroid ultrasound ordered due to nodule noted by CT.  04/14/2022 thyroid ultrasound with 3 nodules in the right mid and right lower gland meeting criteria to consider fine-needle aspiration biopsy.  She was referred to endocrinology due to multinodular thyroid gland most consistent with multinodular goiter.  Presents today for follow-up independently.  BP at home still most often greater than 130/80.  Often in the 150s when checked with her arm cuff at home.  Walking her dog for exercise.  No chest pain, dyspnea, orthopnea, PND.  EKGs/Labs/Other Studies Reviewed:   The following studies were reviewed today: Echo 08/2018 Study Conclusions   - Left ventricle: The cavity size was normal. Systolic function was   normal. The estimated ejection fraction was in  the range of 60%   to 65%. Wall motion was normal; there were no regional wall   motion abnormalities. - Mitral valve: Valve area by continuity equation (using LVOT   flow): 2.52 cm^2. - Left atrium: The atrium was severely dilated. - Right atrium: The atrium was mildly dilated. - Pulmonary arteries: Systolic pressure was within the normal   range. PA peak pressure: 34 mm Hg (S).    EKG:  No EKG today.  Recent Labs: 12/30/2021: ALT 19 04/07/2022: Hemoglobin 14.1; Platelets 205 04/14/2022: BUN 22; Creatinine, Ser 1.03; Potassium 4.0; Sodium 140; TSH 1.530  Recent  Lipid Panel    Component Value Date/Time   CHOL 183 12/30/2021 0900   TRIG 97 12/30/2021 0900   HDL 54 12/30/2021 0900   CHOLHDL 3.4 12/30/2021 0900   LDLCALC 111 (H) 12/30/2021 0900    Risk Assessment/Calculations:   CHA2DS2-VASc Score = 5   This indicates a 7.2% annual risk of stroke. The patient's score is based upon: CHF History: 0 HTN History: 1 Diabetes History: 1 Stroke History: 0 Vascular Disease History: 0 Age Score: 2 Gender Score: 1     Home Medications   Current Meds  Medication Sig   allopurinol (ZYLOPRIM) 100 MG tablet Take 1 tablet (100 mg total) by mouth daily.   atenolol (TENORMIN) 25 MG tablet TAKE 1 TABLET BY MOUTH TWICE DAILY.   colchicine 0.6 MG tablet Take 1 tablet (0.6 mg total) by mouth daily. Take for gout flare.   ELIQUIS 5 MG TABS tablet TAKE ONE TABLET BY MOUTH TWICE DAILY   esomeprazole (NEXIUM) 20 MG capsule Take 20 mg by mouth daily as needed (heartburn).   hydrochlorothiazide (HYDRODIURIL) 12.5 MG tablet Take 1 tablet (12.5 mg total) by mouth daily.   Multiple Vitamins-Calcium (DAILY VITAMINS FOR WOMEN PO) Take 1 tablet by mouth daily.   pravastatin (PRAVACHOL) 10 MG tablet Take 1 tablet (10 mg total) by mouth daily.   valsartan (DIOVAN) 320 MG tablet Take 1 tablet (320 mg total) by mouth daily.   [DISCONTINUED] valsartan (DIOVAN) 160 MG tablet Take 1 tablet (160 mg total) by mouth daily.   Current Facility-Administered Medications for the 04/29/22 encounter (Office Visit) with Loel Dubonnet, NP  Medication   dexamethasone (DECADRON) injection 4 mg     Review of Systems      All other systems reviewed and are otherwise negative except as noted above.  Physical Exam    VS:  BP (!) 148/78 (BP Location: Right Arm, Patient Position: Sitting)   Pulse 66   Ht '5\' 3"'$  (1.6 m)   Wt 137 lb (62.1 kg)   BMI 24.27 kg/m  , BMI Body mass index is 24.27 kg/m.  Wt Readings from Last 3 Encounters:  04/29/22 137 lb (62.1 kg)  04/14/22 137 lb  (62.1 kg)  04/09/22 135 lb (61.2 kg)    GEN: Well nourished, well developed, in no acute distress. HEENT: normal. Neck: Supple, no JVD, carotid bruits, or masses. Cardiac: RRR, no murmurs, rubs, or gallops. No clubbing, cyanosis, edema.  Radials/PT 2+ and equal bilaterally.  Respiratory:  Respirations regular and unlabored, clear to auscultation bilaterally. GI: Soft, nontender, nondistended. MS: No deformity or atrophy. Skin: Warm and dry, no rash. Neuro:  Strength and sensation are intact. Psych: Normal affect.  Assessment & Plan    Permanent atrial fibrillation/hypercoagulable state -rate controlled today on atenolol 25 mg twice daily. CHA2DS2-VASc Score = 5 [CHF History: 0, HTN History: 1, Diabetes History: 1, Stroke History:  0, Vascular Disease History: 0, Age Score: 2, Gender Score: 1].  Therefore, the patient's annual risk of stroke is 7.2 %.    Continue Eliquis 5 mg twice daily.  Does not meet dose reduction criteria.  Hypertension-Since transition from Losartan to Valsartan BP improved but not at goal of less than 130/80.  Continue hydrochlorothiazide 12.5 mg daily.  Increase valsartan to 320 mg daily.  Continue atenolol 25 mg twice daily.  Thyroid nodule - 33m by recent CT. Thyroid [place] nodules. Referred to endocrinology.   Hypokalemia - 04/07/22 K 3.4 in ED. PO repletion provided.  Repeat BMP with normal potassium.  Hyperlipidemia -continue pravastatin.     Disposition: Follow up in 3 month(s) with PJenkins Rouge MD or APP.  Signed, CLoel Dubonnet NP 04/30/2022, 12:56 PM [facility]

## 2022-04-30 ENCOUNTER — Telehealth: Payer: Self-pay | Admitting: Cardiovascular Disease

## 2022-04-30 ENCOUNTER — Encounter (HOSPITAL_BASED_OUTPATIENT_CLINIC_OR_DEPARTMENT_OTHER): Payer: Self-pay | Admitting: Family

## 2022-04-30 DIAGNOSIS — I10 Essential (primary) hypertension: Secondary | ICD-10-CM

## 2022-04-30 NOTE — Telephone Encounter (Signed)
Returned call to patient,  answered question about patient medications. And she is agreeable to lab work one week after new medication change.     "Can you call please? In clinic recommend increase Valsartan to '320mg'$  QD. Continue HCTZ 12.'5mg'$  QD, Atenolol '25mg'$  BID. TY!   Would also be helpful for her to get BMP in a couple weeks to monitor kidney function, potassium which we did not discuss in clinic. If she wishes to wait to make changes until she sees Dr. Johnsie Cancel in November, she may. "

## 2022-04-30 NOTE — Telephone Encounter (Signed)
Patient calling to discuss the changes to her medication that yall discuss on yesterday. Please advise

## 2022-05-06 ENCOUNTER — Telehealth (HOSPITAL_BASED_OUTPATIENT_CLINIC_OR_DEPARTMENT_OTHER): Payer: Self-pay | Admitting: Family

## 2022-05-06 DIAGNOSIS — E042 Nontoxic multinodular goiter: Secondary | ICD-10-CM | POA: Diagnosis not present

## 2022-05-06 NOTE — Telephone Encounter (Signed)
   Pre-operative Risk Assessment    Patient Name: Alicia Rodgers  DOB: [birth year] MRN: [id number]      Request for Surgical Clearance    Procedure:   FNA biopsy of thyroid nodule  Date of Surgery:  Clearance TBD                                 Surgeon:  Dr. Revonda Humphrey Group or Practice Name:  Endocrinology Phone number:  Mamie Nick) [phone] Fax number:  (F) [phone]   Type of Clearance Requested:   - Pharmacy:  Hold Apixaban (Eliquis)     Type of Anesthesia:  Not Indicated   Additional requests/questions: None  Signed, Loel Dubonnet   05/06/2022, 2:09 PM

## 2022-05-06 NOTE — Telephone Encounter (Signed)
Patient with diagnosis of atrial fibrillation on Eliquis for anticoagulation.    Procedure: FNA biopsy of thyroid nodule Date of procedure: TBD   CHA2DS2-VASc Score = 5   This indicates a 7.2% annual risk of stroke. The patient's score is based upon: CHF History: 0 HTN History: 1 Diabetes History: 1 Stroke History: 0 Vascular Disease History: 0 Age Score: 2 Gender Score: 1   CrCl 45 Platelet count 205  Per office protocol, patient can hold Eliquis for 2 days prior to procedure.    Patient will not need bridging with Lovenox (enoxaparin) around procedure.  **This guidance is not considered finalized until pre-operative APP has relayed final recommendations.**

## 2022-05-07 NOTE — Telephone Encounter (Signed)
   Patient Name: Alicia Rodgers  DOB: 10/01/1945 MRN: [id number]  Primary Cardiologist: [name], MD  Chart reviewed as part of pre-operative protocol coverage. Pre-op clearance already addressed by colleagues in earlier phone notes. To summarize recommendations:  -Per office protocol, patient can hold Eliquis for 2 days prior to procedure.    Patient will not need bridging with Lovenox (enoxaparin) around procedure.  No medical clearance requested  Will route this bundled recommendation to requesting provider via Epic fax function and remove from pre-op pool. Please call with questions.  Elgie Collard, PA-C 05/07/2022, 4:16 PM

## 2022-05-12 ENCOUNTER — Other Ambulatory Visit: Payer: Self-pay | Admitting: Endocrinology

## 2022-05-12 DIAGNOSIS — E042 Nontoxic multinodular goiter: Secondary | ICD-10-CM

## 2022-05-13 DIAGNOSIS — I10 Essential (primary) hypertension: Secondary | ICD-10-CM | POA: Diagnosis not present

## 2022-05-14 ENCOUNTER — Telehealth (HOSPITAL_BASED_OUTPATIENT_CLINIC_OR_DEPARTMENT_OTHER): Payer: Self-pay

## 2022-05-14 DIAGNOSIS — I10 Essential (primary) hypertension: Secondary | ICD-10-CM

## 2022-05-14 LAB — BASIC METABOLIC PANEL
BUN/Creatinine Ratio: 16 (ref 12–28)
BUN: 20 mg/dL (ref 8–27)
CO2: 22 mmol/L (ref 20–29)
Calcium: 10.2 mg/dL (ref 8.7–10.3)
Chloride: 101 mmol/L (ref 96–106)
Creatinine, Ser: 1.25 mg/dL — ABNORMAL HIGH (ref 0.57–1.00)
Glucose: 103 mg/dL — ABNORMAL HIGH (ref 70–99)
Potassium: 4.2 mmol/L (ref 3.5–5.2)
Sodium: 142 mmol/L (ref 134–144)
eGFR: 44 mL/min/{1.73_m2} — ABNORMAL LOW (ref >59)

## 2022-05-14 NOTE — Telephone Encounter (Addendum)
Seen by patient Signe Colt on 05/14/2022 11:05 AM; repeat labs ordered and mailed to patient along with follow up mychart message.    ----- Message from Loel Dubonnet, NP sent at 05/14/2022 11:03 AM EDT ----- Kidney function slightly decreased from previous. This is very common after adjusting dose of Valsartan and often normalizes over time. Would recommend staying well hydrated and repeat BMP in 2-3 weeks for monitoring.

## 2022-06-04 DIAGNOSIS — I10 Essential (primary) hypertension: Secondary | ICD-10-CM | POA: Diagnosis not present

## 2022-06-05 LAB — BASIC METABOLIC PANEL
BUN/Creatinine Ratio: 14 (ref 12–28)
BUN: 17 mg/dL (ref 8–27)
CO2: 22 mmol/L (ref 20–29)
Calcium: 9.9 mg/dL (ref 8.7–10.3)
Chloride: 103 mmol/L (ref 96–106)
Creatinine, Ser: 1.19 mg/dL — ABNORMAL HIGH (ref 0.57–1.00)
Glucose: 102 mg/dL — ABNORMAL HIGH (ref 70–99)
Potassium: 3.7 mmol/L (ref 3.5–5.2)
Sodium: 142 mmol/L (ref 134–144)
eGFR: 47 mL/min/{1.73_m2} — ABNORMAL LOW (ref >59)

## 2022-06-19 NOTE — Progress Notes (Signed)
Cardiology Office Note   Date:  07/03/2022   ID:  Alicia, Rodgers [birth year], MRN [id number]  PCP:  Orma Render, NP  Cardiologist:  Dr. Johnsie Cancel     No chief complaint on file.      History of Present Illness: Alicia Rodgers is a 76 y.o. female who presents for atrial fib. Diagnosed on office visit 08/25/18 TTE normal EF LA 44 mm  [facility] 10/06/18 with conversion but ERAF Decision made to rate control CHADVAS 3 on Eliquis and beta blocker   Eliquis is expensive until she reaches her deductible   No cardiac issues History of gout in right foot Rx colchicine   Seen in ED 04/07/22 with headache and elevated BP.  CT/MRI head negative Noted 2 cm tyroid nodule being w/u now [place] FNA BX done 11/15  BP improved on current meds My reading today 130/80 mmHg  Past Medical History:  Diagnosis Date   Anemia    Atrial fibrillation ([facility])    CERVICAL CANCER, HX OF    Fibrocystic breast    HYPERLIPIDEMIA    HYPERTENSION    Peripheral neuropathy    RHEUMATIC FEVER, HX OF     Past Surgical History:  Procedure Laterality Date   APPENDECTOMY     BREAST CYST EXCISION     BREAST EXCISIONAL BIOPSY Left    benign   CARDIOVERSION N/A 10/06/2018   Procedure: CARDIOVERSION;  Surgeon: Josue Hector, MD;  Location: [facility] ENDOSCOPY;  Service: Cardiovascular;  Laterality: N/A;   CESAREAN SECTION     x3   TONSILLECTOMY     TOTAL ABDOMINAL HYSTERECTOMY       Current Outpatient Medications  Medication Sig Dispense Refill   allopurinol (ZYLOPRIM) 100 MG tablet Take 1 tablet (100 mg total) by mouth daily. 90 tablet 3   atenolol (TENORMIN) 25 MG tablet TAKE 1 TABLET BY MOUTH TWICE DAILY. 180 tablet 3   colchicine 0.6 MG tablet Take 1 tablet (0.6 mg total) by mouth daily. Take for gout flare. (Patient taking differently: Take 0.6 mg by mouth as needed. Take for gout flare.) 30 tablet 2   ELIQUIS 5 MG TABS tablet TAKE ONE TABLET BY MOUTH TWICE DAILY 60 tablet 6   esomeprazole (NEXIUM) 20 MG capsule Take 20  mg by mouth daily as needed (heartburn).     hydrochlorothiazide (HYDRODIURIL) 12.5 MG tablet Take 1 tablet (12.5 mg total) by mouth daily. 90 tablet 3   Multiple Vitamins-Calcium (DAILY VITAMINS FOR WOMEN PO) Take 1 tablet by mouth daily.     pravastatin (PRAVACHOL) 10 MG tablet Take 1 tablet (10 mg total) by mouth daily. 90 tablet 3   valsartan (DIOVAN) 320 MG tablet Take 1 tablet (320 mg total) by mouth daily. 90 tablet 3   Current Facility-Administered Medications  Medication Dose Route Frequency Provider Last Rate Last Admin   dexamethasone (DECADRON) injection 4 mg  4 mg Intra-articular Once Trula Slade, DPM        Allergies:   Ciprofloxacin and Penicillins    Social History:  The patient  reports that she has never smoked. She has never been exposed to tobacco smoke. She has never used smokeless tobacco. She reports that she does not drink alcohol and does not use drugs.   Family History:  The patient's family history includes Breast cancer in her maternal aunt and paternal aunt; Cancer in her maternal grandmother; Heart disease in her father and maternal grandfather; Hyperlipidemia in her brother; Hypertension in  her brother; Stroke in her mother and paternal grandfather; Thyroid cancer in her paternal aunt.    ROS:  General:no colds or fevers currently, no weight changes Skin:no rashes or ulcers HEENT:one episode of blurred vision with rapid HR, no congestion CV:see HPI PUL:see HPI GI:no diarrhea constipation or melena, no indigestion GU:no hematuria, no dysuria MS:no joint pain, no claudication Neuro:no syncope, no lightheadedness Endo:no diabetes, no thyroid disease  Wt Readings from Last 3 Encounters:  07/03/22 137 lb (62.1 kg)  06/23/22 137 lb (62.1 kg)  04/29/22 137 lb (62.1 kg)     PHYSICAL EXAM: VS:  BP (!) 140/80   Pulse 73   Ht '5\' 2"'$  (1.575 m)   Wt 137 lb (62.1 kg)   SpO2 97%   BMI 25.06 kg/m  , BMI Body mass index is 25.06 kg/m.  Affect  appropriate Healthy:  appears stated age 83: palpable right thyroid nodule  Neck supple with no adenopathy JVP normal no bruits no thyromegaly Lungs clear with no wheezing and good diaphragmatic motion Heart:  S1/S2 1/6 SEM murmur, no rub, gallop or click PMI normal Abdomen: benighn, BS positve, no tenderness, no AAA no bruit.  No HSM or HJR Distal pulses intact with no bruits No edema Neuro non-focal Skin warm and dry No muscular weakness  EKG:    Atrial flutter rate 67 nonspecific ST changes 07/03/2022 afib rate 66 PVC no acute changes   Recent Labs: 12/30/2021: ALT 19 04/07/2022: Hemoglobin 14.1; Platelets 205 04/14/2022: TSH 1.530 06/04/2022: BUN 17; Creatinine, Ser 1.19; Potassium 3.7; Sodium 142    Lipid Panel    Component Value Date/Time   CHOL 183 12/30/2021 0900   TRIG 97 12/30/2021 0900   HDL 54 12/30/2021 0900   CHOLHDL 3.4 12/30/2021 0900   LDLCALC 111 (H) 12/30/2021 0900     Other studies Reviewed: Additional studies/ records that were reviewed today include: . Echo 08/2018 Study Conclusions   - Left ventricle: The cavity size was normal. Systolic function was   normal. The estimated ejection fraction was in the range of 60%   to 65%. Wall motion was normal; there were no regional wall   motion abnormalities. - Mitral valve: Valve area by continuity equation (using LVOT   flow): 2.52 cm^2. - Left atrium: The atrium was severely dilated. - Right atrium: The atrium was mildly dilated. - Pulmonary arteries: Systolic pressure was within the normal   range. PA peak pressure: 34 mm Hg (S).    ASSESSMENT AND PLAN:  1.  Afib: permanent rate control and anticoagulation good Severe LAE failed [facility] Labs done by primary  [place] Rankin q 6 months   2. Hx of COVID CXR 08/21/19 NAD Received vaccine April 2021      3.  Murmur with clindamycin for dental procedures with hx of rheumatic fever. Echo done 09/01/18 no significant valve disease will update   4  HTN :  Well controlled.  Continue current medications and low sodium Dash type diet.    5. HLD:  On statin labs with primary Discussed utility of calcium score  6. Gout:  post rx steroid injection and colchicine has f/u with rheumatology on allopurinol and adhering to low purine diet   7. Thyroid:  2 cm right nodule for biopsy f/u endocrine/primary    Current medicines are reviewed with the patient today.  The patient Has no concerns regarding medicines.  TTE   Disposition:   FU: 6 months   Signed, [name], MD  07/03/2022  9:[facility] [place], [place], [place], [place] Phone: [phone]; Fax: [phone]

## 2022-06-23 ENCOUNTER — Encounter (HOSPITAL_BASED_OUTPATIENT_CLINIC_OR_DEPARTMENT_OTHER): Payer: Self-pay | Admitting: Nurse Practitioner

## 2022-06-23 ENCOUNTER — Ambulatory Visit (INDEPENDENT_AMBULATORY_CARE_PROVIDER_SITE_OTHER): Payer: Medicare Other | Admitting: Nurse Practitioner

## 2022-06-23 VITALS — BP 132/68 | HR 72 | Ht 62.0 in | Wt 137.0 lb

## 2022-06-23 DIAGNOSIS — E1169 Type 2 diabetes mellitus with other specified complication: Secondary | ICD-10-CM

## 2022-06-23 DIAGNOSIS — I480 Paroxysmal atrial fibrillation: Secondary | ICD-10-CM | POA: Diagnosis not present

## 2022-06-23 DIAGNOSIS — I4811 Longstanding persistent atrial fibrillation: Secondary | ICD-10-CM

## 2022-06-23 DIAGNOSIS — I10 Essential (primary) hypertension: Secondary | ICD-10-CM

## 2022-06-23 DIAGNOSIS — Z23 Encounter for immunization: Secondary | ICD-10-CM

## 2022-06-23 DIAGNOSIS — N183 Chronic kidney disease, stage 3 unspecified: Secondary | ICD-10-CM

## 2022-06-23 LAB — POCT GLYCOSYLATED HEMOGLOBIN (HGB A1C): Hemoglobin A1C: 6.2 % — AB (ref 4.0–5.6)

## 2022-06-23 NOTE — Patient Instructions (Signed)
Thank you for choosing [place] at [facility] for your Primary Care needs. I am excited for the opportunity to partner with you to meet your health care goals. It was a pleasure meeting you today!  Recommendations from today's visit: I recommend trying Flonase for your runny nose and ear symptoms. This should help with the symptoms of allergies. If you try this for about 4 weeks and don't notice a big difference let us know.    Information on diet, exercise, and health maintenance recommendations are listed below. This is information to help you be sure you are on track for optimal health and monitoring.   Please look over this and let us know if you have any questions or if you have completed any of the health maintenance outside of [place] so that we can be sure your records are up to date.  ___________________________________________________________ About Me: I am an Adult-Geriatric Nurse Practitioner with a background in caring for patients for more than 20 years with a strong intensive care background. I provide primary care and sports medicine services to patients age 62 and older within this office. My education had a strong focus on caring for the older adult population, which I am passionate about. I am also the director of the APP Fellowship with [facility].   My desire is to provide you with the best service through preventive medicine and supportive care. I consider you a part of the medical team and value your input. I work diligently to ensure that you are heard and your needs are met in a safe and effective manner. I want you to feel comfortable with me as your provider and want you to know that your health concerns are important to me.  For your information, our office hours are: Monday, Tuesday, and Thursday 8:00 AM - 5:00 PM Wednesday and Friday 8:00 AM - 12:00 PM.   In my time away from the office I am teaching new APP's within the system and am  unavailable, but my partner, Dr. Burnard Bunting is in the office for emergent needs.   If you have questions or concerns, please call our office at [phone] or send [place] a MyChart message and we will respond as quickly as possible.  ____________________________________________________________ MyChart:  For all urgent or time sensitive needs we ask that you please call the office to avoid delays. Our number is [phone]. MyChart is not constantly monitored and due to the large volume of messages a day, replies may take up to 72 business hours.  MyChart Policy: MyChart allows for you to see your visit notes, after visit summary, provider recommendations, lab and tests results, make an appointment, request refills, and contact your provider or the office for non-urgent questions or concerns. Providers are seeing patients during normal business hours and do not have built in time to review MyChart messages.  We ask that you allow a minimum of 3 business days for responses to [employer]. For this reason, please do not send urgent requests through [place]. Please call the office at [phone]. New and ongoing conditions may require a visit. We have virtual and in person visit available for your convenience.  Complex MyChart concerns may require a visit. Your provider may request you schedule a virtual or in person visit to ensure we are providing the best care possible. MyChart messages sent after 11:00 AM on Friday will not be received by the provider until Monday morning.    Lab and Test  Results: You will receive your lab and test results on MyChart as soon as they are completed and results have been sent by the lab or testing facility. Due to this service, you will receive your results BEFORE your provider.  I review lab and tests results each morning prior to seeing patients. Some results require collaboration with other providers to ensure you are receiving the most appropriate care. For this  reason, we ask that you please allow a minimum of 3-5 business days from the time the ALL results have been received for your provider to receive and review lab and test results and contact you about these.  Most lab and test result comments from the provider will be sent through [place]. Your provider may recommend changes to the plan of care, follow-up visits, repeat testing, ask questions, or request an office visit to discuss these results. You may reply directly to this message or call the office at [phone] to provide information for the provider or set up an appointment. In some instances, you will be called with test results and recommendations. Please let us know if this is preferred and we will make note of this in your chart to provide this for you.    If you have not heard a response to your lab or test results in 5 business days from all results returning to [place], please call the office to let us know. We ask that you please avoid calling prior to this time unless there is an emergent concern. Due to high call volumes, this can delay the resulting process.  After Hours: For all non-emergency after hours needs, please call the office at [phone] and select the option to reach the on-call provider service. On-call services are shared between multiple [place] offices and therefore it will not be possible to speak directly with your provider. On-call providers may provide medical advice and recommendations, but are unable to provide refills for maintenance medications.  For all emergency or urgent medical needs after normal business hours, we recommend that you seek care at the closest Urgent Care or Emergency Department to ensure appropriate treatment in a timely manner.  [facility] [place] has a 24 hour emergency room located on the ground floor for your convenience.   Urgent Concerns During the Business Day Providers are seeing patients from 8AM to [place] with a  busy schedule and are most often not able to respond to non-urgent calls until the end of the day or the next business day. If you should have URGENT concerns during the day, please call and speak to the nurse or schedule a same day appointment so that we can address your concern without delay.   Thank you, again, for choosing me as your health care partner. I appreciate your trust and look forward to learning more about you.   Worthy Keeler, DNP, AGNP-c ___________________________________________________________  Health Maintenance Recommendations Screening Testing Mammogram Every 1 -2 years based on history and risk factors Starting at age 38 Pap Smear Ages 21-39 every 3 years Ages 16-65 every 5 years with HPV testing More frequent testing may be required based on results and history Colon Cancer Screening Every 1-10 years based on test performed, risk factors, and history Starting at age 56 Bone Density Screening Every 2-10 years based on history Starting at age 40 for women Recommendations for men differ based on medication usage, history, and risk factors AAA Screening One time ultrasound Men 31-65 years old who have every smoked Lung  Cancer Screening Low Dose Lung CT every 12 months Age 84-80 years with a 30 pack-year smoking history who still smoke or who have quit within the last 15 years  Screening Labs Routine  Labs: Complete Blood Count (CBC), Complete Metabolic Panel (CMP), Cholesterol (Lipid Panel) Every 6-12 months based on history and medications May be recommended more frequently based on current conditions or previous results Hemoglobin A1c Lab Every 3-12 months based on history and previous results Starting at age 60 or earlier with diagnosis of diabetes, high cholesterol, BMI >26, and/or risk factors Frequent monitoring for patients with diabetes to ensure blood sugar control Thyroid Panel (TSH w/ T3 & T4) Every 6 months based on history, symptoms, and risk  factors May be repeated more often if on medication HIV One time testing for all patients 11 and older May be repeated more frequently for patients with increased risk factors or exposure Hepatitis C One time testing for all patients 88 and older May be repeated more frequently for patients with increased risk factors or exposure Gonorrhea, Chlamydia Every 12 months for all sexually active persons 13-24 years Additional monitoring may be recommended for those who are considered high risk or who have symptoms PSA Men 38-64 years old with risk factors Additional screening may be recommended from age 8-69 based on risk factors, symptoms, and history  Vaccine Recommendations Tetanus Booster All adults every 10 years Flu Vaccine All patients 6 months and older every year COVID Vaccine All patients 12 years and older Initial dosing with booster May recommend additional booster based on age and health history HPV Vaccine 2 doses all patients age 62-26 Dosing may be considered for patients over 26 Shingles Vaccine (Shingrix) 2 doses all adults 74 years and older Pneumonia (Pneumovax 23) All adults 57 years and older May recommend earlier dosing based on health history Pneumonia (Prevnar 54) All adults 94 years and older Dosed 1 year after Pneumovax 23  Additional Screening, Testing, and Vaccinations may be recommended on an individualized basis based on family history, health history, risk factors, and/or exposure.  __________________________________________________________  Diet Recommendations for All Patients  I recommend that all patients maintain a diet low in saturated fats, carbohydrates, and cholesterol. While this can be challenging at first, it is not impossible and small changes can make big differences.  Things to try: Decreasing the amount of soda, sweet tea, and/or juice to one or less per day and replace with water While water is always the first choice, if you do  not like water you may consider adding a water additive without sugar to improve the taste other sugar free drinks Replace potatoes with a brightly colored vegetable at dinner Use healthy oils, such as canola oil or olive oil, instead of butter or hard margarine Limit your bread intake to two pieces or less a day Replace regular pasta with low carb pasta options Bake, broil, or grill foods instead of frying Monitor portion sizes  Eat smaller, more frequent meals throughout the day instead of large meals  An important thing to remember is, if you love foods that are not great for your health, you don't have to give them up completely. Instead, allow these foods to be a reward when you have done well. Allowing yourself to still have special treats every once in a while is a nice way to tell yourself thank you for working hard to keep yourself healthy.   Also remember that every day is a new day. If you have  a bad day and "fall off the wagon", you can still climb right back up and keep moving along on your journey!  We have resources available to help you!  Some websites that may be helpful include: www.http://carter.biz/  Www.VeryWellFit.com _____________________________________________________________  Activity Recommendations for All Patients  I recommend that all adults get at least 20 minutes of moderate physical activity that elevates your heart rate at least 5 days out of the week.  Some examples include: Walking or jogging at a pace that allows you to carry on a conversation Cycling (stationary bike or outdoors) Water aerobics Yoga Weight lifting Dancing If physical limitations prevent you from putting stress on your joints, exercise in a pool or seated in a chair are excellent options.  Do determine your MAXIMUM heart rate for activity: YOUR AGE - 220 = MAX HeartRate   Remember! Do not push yourself too hard.  Start slowly and build up your pace, speed, weight, time in exercise,  etc.  Allow your body to rest between exercise and get good sleep. You will need more water than normal when you are exerting yourself. Do not wait until you are thirsty to drink. Drink with a purpose of getting in at least 8, 8 ounce glasses of water a day plus more depending on how much you exercise and sweat.    If you begin to develop dizziness, chest pain, abdominal pain, jaw pain, shortness of breath, headache, vision changes, lightheadedness, or other concerning symptoms, stop the activity and allow your body to rest. If your symptoms are severe, seek emergency evaluation immediately. If your symptoms are concerning, but not severe, please let us know so that we can recommend further evaluation.

## 2022-06-23 NOTE — Progress Notes (Signed)
Alicia Render, DNP, AGNP-c Primary Care & Sports Medicine [address]  [place], [place] [address] [phone]  New patient visit   Patient: Alicia Rodgers   DOB: 07/03/1946   76 y.o. Female  MRN: [id number] Visit Date: 06/23/2022  Patient Care Team: de [place], Blondell Reveal, MD as PCP - General (Family Medicine) Josue Hector, MD as PCP - Cardiology (Cardiology)  Today's Vitals   06/23/22 0900 06/23/22 0933  BP: (!) 153/78 132/68  Pulse: 72   SpO2: 98%   Weight: 137 lb (62.1 kg)   Height: '5\' 2"'$  (1.575 m)    Body mass index is 25.06 kg/m.   Today's healthcare provider: Orma Render, NP   Chief Complaint  Patient presents with   New Patient (Initial Visit)    Pt presents today to establish care she will be following Dr Tennis Must [place].    Subjective    Alicia Rodgers is a 76 y.o. female who presents today as a new patient to establish care.  At this time she is unsure if she will follow me to the new practice or transition her care to Dr. Burnard Bunting.  She was provided on information with the new practice in the event that she would like to transition.   Patient endorses the following concerns presently:  HTN Tamana tells me she has a chronic history of hypertension.  Recently she did have an exacerbation elevation in her blood pressure and she was unable to get in with her routine cardiologist and therefore was referred to Laurann Montana here at [facility].  She tells me that since seeing cardiology or her blood pressure has been much improved at home.  She does endorse a history of whitecoat hypertension.  She endorses typical blood pressure readings 1 28-1 37/60-70's when monitored at home.  She is currently taking valsartan 320 mg and hydrochlorothiazide 12.5 mg.  She currently denies chest pain, dizziness, headaches.  She does endorse intermittent shortness of breath and palpitations due to chronic atrial fibrillation.  She denies any worsening symptoms at  this time.  She will be seeing Dr. Johnsie Cancel next week.  She tells me that she feels her blood pressure is slightly elevated today because of anxiety being in the new doctor's office as well as the upcoming biopsy on her thyroid next Wednesday.  She tells me that she does have a history of cervical cancer many years ago when her daughter was 49 months old and this increases her anxiety about the need for the biopsy.  Afib Latreshia endorses chronic atrial fibrillation for which she has had 3 ablations however none of these were successful in eradicating the abnormal rhythm.  She tells me that she occasionally will have palpitations or shortness of breath when they are particularly bad however that is rare and she has not noticed any new or worsening symptoms lately.  She is chronically anticoagulated on Eliquis.  She does express concerns about her Eliquis as the cost is very expensive and not well covered under her current insurance plan.  She tells me that she plans to discuss this with cardiology to see if there are alternatives that she could be transition to that would give her the same efficacy to help reduce the financial burden.     Liel tells me that she does have a history of rheumatoid arthritis and gout flares in the dorsal surface of the right foot.  She is currently taking allopurinol consistently to help  reduce flares and this has been effective for her.  In the event of a flare she has taken colchicine successfully in the past.  History reviewed and reveals the following: Past Medical History:  Diagnosis Date   Anemia    Atrial fibrillation (HCC)    CERVICAL CANCER, HX OF    Fibrocystic breast    HYPERLIPIDEMIA    HYPERTENSION    Peripheral neuropathy    RHEUMATIC FEVER, HX OF    Past Surgical History:  Procedure Laterality Date   APPENDECTOMY     BREAST CYST EXCISION     BREAST EXCISIONAL BIOPSY Left    benign   CARDIOVERSION N/A 10/06/2018   Procedure: CARDIOVERSION;  Surgeon:  Josue Hector, MD;  Location: [facility] ENDOSCOPY;  Service: Cardiovascular;  Laterality: N/A;   CESAREAN SECTION     x3   TONSILLECTOMY     TOTAL ABDOMINAL HYSTERECTOMY     Family Status  Relation Name Status   Mother  Deceased   Father  Deceased   Brother  Alive   MGM  (Not Specified)   MGF  (Not Specified)   PGF  (Not Specified)   Mat Aunt  (Not Specified)   Alicia Rodgers  (Not Specified)   Alicia Rodgers  (Not Specified)   Family History  Problem Relation Age of Onset   Stroke Mother    Heart disease Father    Hypertension Brother    Hyperlipidemia Brother    Cancer Maternal Grandmother        Unsure   Heart disease Maternal Grandfather    Stroke Paternal Grandfather    Breast cancer Maternal Aunt    Breast cancer Paternal Aunt    Thyroid cancer Paternal Aunt    Social History   Socioeconomic History   Marital status: Widowed    Spouse name: Not on file   Number of children: 3   Years of education: 1 yr coll   Highest education level: Not on file  Occupational History   Occupation: Optometrist    Comment: Retired  Tobacco Use   Smoking status: Never    Passive exposure: Never   Smokeless tobacco: Never  Vaping Use   Vaping Use: Never used  Substance and Sexual Activity   Alcohol use: No   Drug use: No   Sexual activity: Not on file  Other Topics Concern   Not on file  Social History Narrative   Lives with her daughter and her family.   Right-handed.   1 cup caffeine daily.   Social Determinants of Health   Financial Resource Strain: Not on file  Food Insecurity: Not on file  Transportation Needs: Not on file  Physical Activity: Not on file  Stress: Not on file  Social Connections: Not on file   Outpatient Medications Prior to Visit  Medication Sig   allopurinol (ZYLOPRIM) 100 MG tablet Take 1 tablet (100 mg total) by mouth daily.   colchicine 0.6 MG tablet Take 1 tablet (0.6 mg total) by mouth daily. Take for gout flare. (Patient taking differently: Take 0.6  mg by mouth as needed. Take for gout flare.)   ELIQUIS 5 MG TABS tablet TAKE ONE TABLET BY MOUTH TWICE DAILY   esomeprazole (NEXIUM) 20 MG capsule Take 20 mg by mouth daily as needed (heartburn).   hydrochlorothiazide (HYDRODIURIL) 12.5 MG tablet Take 1 tablet (12.5 mg total) by mouth daily.   Multiple Vitamins-Calcium (DAILY VITAMINS FOR WOMEN PO) Take 1 tablet by mouth daily.   pravastatin (  PRAVACHOL) 10 MG tablet Take 1 tablet (10 mg total) by mouth daily.   valsartan (DIOVAN) 320 MG tablet Take 1 tablet (320 mg total) by mouth daily.   [DISCONTINUED] atenolol (TENORMIN) 25 MG tablet TAKE 1 TABLET BY MOUTH TWICE DAILY.   Facility-Administered Medications Prior to Visit  Medication Dose Route Frequency Provider   dexamethasone (DECADRON) injection 4 mg  4 mg Intra-articular Once Trula Slade, DPM   Allergies  Allergen Reactions   Ciprofloxacin Other (See Comments)   Penicillins Swelling and Rash    Did it involve swelling of the face/tongue/throat, SOB, or low BP? No Did it involve sudden or severe rash/hives, skin peeling, or any reaction on the inside of your mouth or nose? No Did you need to seek medical attention at a hospital or doctor's office? No When did it last happen?      40 years ago If all above answers are "NO", may proceed with cephalosporin use.     Immunization History  Administered Date(s) Administered   Influenza, Quadrivalent, Recombinant, Inj, Pf 06/23/2022   PFIZER(Purple Top)SARS-COV-2 Vaccination 10/27/2019, 11/22/2019    Review of Systems All review of systems negative except what is listed in the HPI   Objective    BP 132/68   Pulse 72   Ht '5\' 2"'$  (1.575 m)   Wt 137 lb (62.1 kg)   SpO2 98%   BMI 25.06 kg/m  Physical Exam Vitals and nursing note reviewed.  Constitutional:      General: She is not in acute distress.    Appearance: Normal appearance.  Eyes:     Extraocular Movements: Extraocular movements intact.     Conjunctiva/sclera:  Conjunctivae normal.     Pupils: Pupils are equal, round, and reactive to light.  Neck:     Thyroid: Thyromegaly present. No thyroid tenderness.     Vascular: No carotid bruit.  Cardiovascular:     Rate and Rhythm: Normal rate. Rhythm irregular.     Pulses: Normal pulses.     Heart sounds: Normal heart sounds. No murmur heard. Pulmonary:     Effort: Pulmonary effort is normal.     Breath sounds: Normal breath sounds. No wheezing.  Abdominal:     General: Bowel sounds are normal.     Palpations: Abdomen is soft.  Musculoskeletal:        General: Normal range of motion.     Cervical back: Normal range of motion.     Right lower leg: No edema.     Left lower leg: No edema.  Skin:    General: Skin is warm and dry.     Capillary Refill: Capillary refill takes less than 2 seconds.  Neurological:     General: No focal deficit present.     Mental Status: She is alert and oriented to person, place, and time.  Psychiatric:        Mood and Affect: Mood normal.        Behavior: Behavior normal.        Thought Content: Thought content normal.        Judgment: Judgment normal.     Results for orders placed or performed in visit on 06/23/22  POCT glycosylated hemoglobin (Hb A1C)  Result Value Ref Range   Hemoglobin A1C 6.2 (A) 4.0 - 5.6 %   HbA1c POC (<> result, manual entry)     HbA1c, POC (prediabetic range)     HbA1c, POC (controlled diabetic range)      Assessment &  Plan      Problem List Items Addressed This Visit     Essential hypertension    Chronic.  Blood pressure slightly elevated today however she does have a history of whitecoat hypertension.  There are no alarm symptoms present at this time.  At this time recommend continuation of current medication regimen and monitor for elevations in blood pressure very closely while at home.  Recommend follow-up in 6 months or sooner if needed.      Relevant Orders   POCT glycosylated hemoglobin (Hb A1C) (Completed)   CKD  (chronic kidney disease)    Chronic.  Medications reviewed today with no concerning findings for worsening kidney function.  Recommend staying well-hydrated and avoiding medications that can be harsh on the kidneys such as NSAIDs.      Type 2 diabetes mellitus with other specified complication (HCC) - Primary    Chronic.  She is currently not on any daily medications for blood sugar and is controlling with diet and exercise.  Recommend continuation of close monitoring and notify immediately if new symptoms develop such as increased hunger, increased thirst, increased urination, or signs of infection.      Relevant Orders   POCT glycosylated hemoglobin (Hb A1C) (Completed)   RESOLVED: Paroxysmal atrial fibrillation (HCC)   Longstanding persistent atrial fibrillation (HCC)    Chronic.  She is currently anticoagulated appropriately.  No alarm symptoms are present at this time.  Heart rate is irregular rhythm with regular rate on examination.  Continue to follow with cardiology.      Other Visit Diagnoses     Flu vaccine need       Relevant Orders   Flu vaccine, recombinat, quadrivalent, inj (Completed)        Return in about 6 months (around 12/22/2022) for DM.      Ciarah Peace, Coralee Pesa, NP, DNP, AGNP-C Primary Care & Sports Medicine at [place] Maintenance Due Health Maintenance Topics with due status: Overdue     Topic Date Due   FOOT EXAM Never done   OPHTHALMOLOGY EXAM Never done   Diabetic kidney evaluation - Urine ACR Never done   Hepatitis C Screening Never done   DTaP/Tdap/Td Never done   Zoster Vaccines- Shingrix Never done   Pneumonia Vaccine 69+ Years old Never done   COVID-19 Vaccine 12/20/2019   Medicare Annual Wellness (AWV) 01/21/2022

## 2022-06-24 ENCOUNTER — Telehealth: Payer: Self-pay | Admitting: Cardiovascular Disease

## 2022-06-24 NOTE — Telephone Encounter (Signed)
I s/w the pt today and I had stated that we faxed clearance on 05/06/22 and again today to Dr. Chalmers Cater. Pt states Dr. Chalmers Cater is not the one doing the procedure; that she is having the procedure done at [facility] 07/01/22. I informed he pt that it is recommended by her cardiologist to hold her Eliquis x 2 days prior. Pt advised to take Eliquis 06/28/22 and then hold on 11/13 and 11/15, procedure 11/15, pt to resume Eliquis per the doctor doing her procedure, once felt safe to resume. Pt agreeable to plan of care and gave verbal understanding to instructions.   I called [facility] and was able to obtain fax # [phone]; ph# [phone]. Requesting office is aware of the instructions given to the pt as well.

## 2022-06-24 NOTE — Telephone Encounter (Signed)
Patient is following up regarding clearance (see 9/20 phone encounter). Patient states Dr. Almetta Lovely office never received the clearance recommendation regarding Eliquis. Patient is also requesting a call back to discuss the recommendation.

## 2022-07-01 ENCOUNTER — Ambulatory Visit
Admission: RE | Admit: 2022-07-01 | Discharge: 2022-07-01 | Disposition: A | Discharge status: Home / Self Care | Payer: Medicare Other | Source: Ambulatory Visit | Attending: Endocrinology | Admitting: Endocrinology

## 2022-07-01 ENCOUNTER — Other Ambulatory Visit (HOSPITAL_COMMUNITY)
Admission: RE | Admit: 2022-07-01 | Discharge: 2022-07-01 | Disposition: A | Discharge status: Home / Self Care | Payer: Medicare Other | Source: Ambulatory Visit | Attending: Physician Assistant | Admitting: Physician Assistant

## 2022-07-01 DIAGNOSIS — E042 Nontoxic multinodular goiter: Secondary | ICD-10-CM

## 2022-07-01 DIAGNOSIS — E041 Nontoxic single thyroid nodule: Secondary | ICD-10-CM | POA: Diagnosis not present

## 2022-07-03 ENCOUNTER — Encounter: Payer: Self-pay | Admitting: Cardiovascular Disease

## 2022-07-03 ENCOUNTER — Ambulatory Visit: Payer: Medicare Other | Attending: Cardiovascular Disease | Admitting: Cardiovascular Disease

## 2022-07-03 VITALS — BP 140/80 | HR 73 | Ht 62.0 in | Wt 137.0 lb

## 2022-07-03 DIAGNOSIS — R011 Cardiac murmur, unspecified: Secondary | ICD-10-CM | POA: Insufficient documentation

## 2022-07-03 DIAGNOSIS — I10 Essential (primary) hypertension: Secondary | ICD-10-CM | POA: Diagnosis not present

## 2022-07-03 DIAGNOSIS — I4821 Permanent atrial fibrillation: Secondary | ICD-10-CM | POA: Insufficient documentation

## 2022-07-03 DIAGNOSIS — E782 Mixed hyperlipidemia: Secondary | ICD-10-CM | POA: Diagnosis not present

## 2022-07-03 LAB — CYTOLOGY - NON PAP

## 2022-07-03 NOTE — Patient Instructions (Signed)
Medication Instructions:  Your physician recommends that you continue on your current medications as directed. Please refer to the Current Medication list given to you today.  *If you need a refill on your cardiac medications before your next appointment, please call your pharmacy*  Lab Work: If you have labs (blood work) drawn today and your tests are completely normal, you will receive your results only by: MyChart Message (if you have MyChart) OR A paper copy in the mail If you have any lab test that is abnormal or we need to change your treatment, we will call you to review the results.   Testing/Procedures: Your physician has requested that you have an echocardiogram. Echocardiography is a painless test that uses sound waves to create images of your heart. It provides your doctor with information about the size and shape of your heart and how well your heart's chambers and valves are working. This procedure takes approximately one hour. There are no restrictions for this procedure. Please do NOT wear cologne, perfume, aftershave, or lotions (deodorant is allowed). Please arrive 15 minutes prior to your appointment time.  Follow-Up: At [facility], you and your health needs are our priority.  As part of our continuing mission to provide you with exceptional heart care, we have created designated Provider Care Teams.  These Care Teams include your primary Cardiologist (physician) and Advanced Practice Providers (APPs -  Physician Assistants and Nurse Practitioners) who all work together to provide you with the care you need, when you need it.  We recommend signing up for the patient portal called "MyChart".  Sign up information is provided on this After Visit Summary.  MyChart is used to connect with patients for Virtual Visits (Telemedicine).  Patients are able to view lab/test results, encounter notes, upcoming appointments, etc.  Non-urgent messages can be sent to your provider as  well.   To learn more about what you can do with MyChart, go to [email or web address].    Your next appointment:   6 month(s)  The format for your next appointment:   In Person  Provider:   Peter Nishan, MD     Important Information About Sugar       

## 2022-07-20 ENCOUNTER — Other Ambulatory Visit: Payer: Self-pay | Admitting: Cardiovascular Disease

## 2022-07-20 DIAGNOSIS — I10 Essential (primary) hypertension: Secondary | ICD-10-CM

## 2022-07-29 ENCOUNTER — Ambulatory Visit (HOSPITAL_COMMUNITY): Payer: Medicare Other | Attending: Cardiovascular Disease

## 2022-07-29 DIAGNOSIS — E785 Hyperlipidemia, unspecified: Secondary | ICD-10-CM | POA: Insufficient documentation

## 2022-07-29 DIAGNOSIS — R011 Cardiac murmur, unspecified: Secondary | ICD-10-CM | POA: Diagnosis not present

## 2022-07-29 DIAGNOSIS — I4891 Unspecified atrial fibrillation: Secondary | ICD-10-CM | POA: Insufficient documentation

## 2022-07-29 DIAGNOSIS — I4821 Permanent atrial fibrillation: Secondary | ICD-10-CM

## 2022-07-29 DIAGNOSIS — I10 Essential (primary) hypertension: Secondary | ICD-10-CM | POA: Insufficient documentation

## 2022-07-29 DIAGNOSIS — I080 Rheumatic disorders of both mitral and aortic valves: Secondary | ICD-10-CM | POA: Diagnosis not present

## 2022-07-29 DIAGNOSIS — E782 Mixed hyperlipidemia: Secondary | ICD-10-CM

## 2022-07-29 LAB — ECHOCARDIOGRAM COMPLETE
Area-P 1/2: 4.31 cm2
S' Lateral: 3 cm

## 2022-08-13 DIAGNOSIS — I4811 Longstanding persistent atrial fibrillation: Secondary | ICD-10-CM | POA: Insufficient documentation

## 2022-08-13 DIAGNOSIS — I480 Paroxysmal atrial fibrillation: Secondary | ICD-10-CM | POA: Insufficient documentation

## 2022-08-13 NOTE — Assessment & Plan Note (Signed)
Chronic.  She is currently not on any daily medications for blood sugar and is controlling with diet and exercise.  Recommend continuation of close monitoring and notify immediately if new symptoms develop such as increased hunger, increased thirst, increased urination, or signs of infection.

## 2022-08-13 NOTE — Assessment & Plan Note (Signed)
Chronic.  Medications reviewed today with no concerning findings for worsening kidney function.  Recommend staying well-hydrated and avoiding medications that can be harsh on the kidneys such as NSAIDs.

## 2022-08-13 NOTE — Assessment & Plan Note (Signed)
Chronic.  Blood pressure slightly elevated today however she does have a history of whitecoat hypertension.  There are no alarm symptoms present at this time.  At this time recommend continuation of current medication regimen and monitor for elevations in blood pressure very closely while at home.  Recommend follow-up in 6 months or sooner if needed.

## 2022-08-13 NOTE — Assessment & Plan Note (Signed)
Chronic.  She is currently anticoagulated appropriately.  No alarm symptoms are present at this time.  Heart rate is irregular rhythm with regular rate on examination.  Continue to follow with cardiology.

## 2022-09-09 ENCOUNTER — Other Ambulatory Visit: Payer: Self-pay

## 2022-09-09 DIAGNOSIS — I482 Chronic atrial fibrillation, unspecified: Secondary | ICD-10-CM

## 2022-09-09 MED ORDER — APIXABAN 5 MG PO TABS: 5.0000 mg | ORAL_TABLET | Freq: Two times a day (BID) | ORAL | 3 refills | Status: DC

## 2022-09-09 NOTE — Telephone Encounter (Signed)
Prescription refill request for Eliquis received. Indication: Afib  Last office visit: 07/03/22 Johnsie Cancel)  Scr: 1.19 (06/04/22)  Age: 77 Weight: 62.1kg  Appropriate dose and refill sent to requested pharmacy.

## 2022-09-09 NOTE — Telephone Encounter (Signed)
Pts pharmacy is requesting a refill on this medication. Please send to [facility]. Please address. Thank you

## 2022-09-29 ENCOUNTER — Ambulatory Visit (INDEPENDENT_AMBULATORY_CARE_PROVIDER_SITE_OTHER): Payer: Medicare Other

## 2022-09-29 ENCOUNTER — Encounter (HOSPITAL_BASED_OUTPATIENT_CLINIC_OR_DEPARTMENT_OTHER): Payer: Self-pay

## 2022-09-29 VITALS — BP 146/79 | HR 65 | Temp 96.5°F | Ht 62.0 in | Wt 137.9 lb

## 2022-09-29 DIAGNOSIS — Z0000 Encounter for general adult medical examination without abnormal findings: Secondary | ICD-10-CM

## 2022-09-29 NOTE — Patient Instructions (Addendum)
Ms. Buechel , Thank you for taking time to come for your Medicare Wellness Visit. I appreciate your ongoing commitment to your health goals. Please review the following plan we discussed and let me know if I can assist you in the future.   These are the goals we discussed:  Goals       No Current goals (pt-stated)        This is a list of the screening recommended for you and due dates:  Health Maintenance  Topic Date Due   DTaP/Tdap/Td vaccine (1 - Tdap) Never done   Eye exam for diabetics  09/29/2022*   Yearly kidney health urinalysis for diabetes  09/30/2022*   Complete foot exam   09/30/2022*   COVID-19 Vaccine (3 - 2023-24 season) 10/15/2022*   Zoster (Shingles) Vaccine (1 of 2) 12/28/2022*   Pneumonia Vaccine (1 of 1 - PCV) 09/30/2023*   Hepatitis C Screening: USPSTF Recommendation to screen - Ages 18-79 yo.  09/30/2023*   Hemoglobin A1C  12/22/2022   Yearly kidney function blood test for diabetes  06/05/2023   Medicare Annual Wellness Visit  09/30/2023   Flu Shot  Completed   DEXA scan (bone density measurement)  Completed   HPV Vaccine  Aged Out  *Topic was postponed. The date shown is not the original due date.    Advanced directives: Advance directive discussed with you today. Even though you declined this today, please call our office should you change your mind, and we can give you the proper paperwork for you to fill out.   Conditions/risks identified: None  Next appointment: Follow up in one year for your annual wellness visit    Preventive Care 65 Years and Older, Female Preventive care refers to lifestyle choices and visits with your health care provider that can promote health and wellness. What does preventive care include? A yearly physical exam. This is also called an annual well check. Dental exams once or twice a year. Routine eye exams. Ask your health care provider how often you should have your eyes checked. Personal lifestyle choices,  including: Daily care of your teeth and gums. Regular physical activity. Eating a healthy diet. Avoiding tobacco and drug use. Limiting alcohol use. Practicing safe sex. Taking low-dose aspirin every day. Taking vitamin and mineral supplements as recommended by your health care provider. What happens during an annual well check? The services and screenings done by your health care provider during your annual well check will depend on your age, overall health, lifestyle risk factors, and family history of disease. Counseling  Your health care provider may ask you questions about your: Alcohol use. Tobacco use. Drug use. Emotional well-being. Home and relationship well-being. Sexual activity. Eating habits. History of falls. Memory and ability to understand (cognition). Work and work Statistician. Reproductive health. Screening  You may have the following tests or measurements: Height, weight, and BMI. Blood pressure. Lipid and cholesterol levels. These may be checked every 5 years, or more frequently if you are over 64 years old. Skin check. Lung cancer screening. You may have this screening every year starting at age 34 if you have a 30-pack-year history of smoking and currently smoke or have quit within the past 15 years. Fecal occult blood test (FOBT) of the stool. You may have this test every year starting at age 31. Flexible sigmoidoscopy or colonoscopy. You may have a sigmoidoscopy every 5 years or a colonoscopy every 10 years starting at age 58. Hepatitis C blood test. Hepatitis B  blood test. Sexually transmitted disease (STD) testing. Diabetes screening. This is done by checking your blood sugar (glucose) after you have not eaten for a while (fasting). You may have this done every 1-3 years. Bone density scan. This is done to screen for osteoporosis. You may have this done starting at age 76. Mammogram. This may be done every 1-2 years. Talk to your health care provider  about how often you should have regular mammograms. Talk with your health care provider about your test results, treatment options, and if necessary, the need for more tests. Vaccines  Your health care provider may recommend certain vaccines, such as: Influenza vaccine. This is recommended every year. Tetanus, diphtheria, and acellular pertussis (Tdap, Td) vaccine. You may need a Td booster every 10 years. Zoster vaccine. You may need this after age 99. Pneumococcal 13-valent conjugate (PCV13) vaccine. One dose is recommended after age 76. Pneumococcal polysaccharide (PPSV23) vaccine. One dose is recommended after age 19. Talk to your health care provider about which screenings and vaccines you need and how often you need them. This information is not intended to replace advice given to you by your health care provider. Make sure you discuss any questions you have with your health care provider. Document Released: 08/30/2015 Document Revised: 04/22/2016 Document Reviewed: 06/04/2015 Elsevier Interactive Patient Education  2017 [employer] Prevention in the Home Falls can cause injuries. They can happen to people of all ages. There are many things you can do to make your home safe and to help prevent falls. What can I do on the outside of my home? Regularly fix the edges of walkways and driveways and fix any cracks. Remove anything that might make you trip as you walk through a door, such as a raised step or threshold. Trim any bushes or trees on the path to your home. Use bright outdoor lighting. Clear any walking paths of anything that might make someone trip, such as rocks or tools. Regularly check to see if handrails are loose or broken. Make sure that both sides of any steps have handrails. Any raised decks and porches should have guardrails on the edges. Have any leaves, snow, or ice cleared regularly. Use sand or salt on walking paths during winter. Clean up any spills in  your garage right away. This includes oil or grease spills. What can I do in the bathroom? Use night lights. Install grab bars by the toilet and in the tub and shower. Do not use towel bars as grab bars. Use non-skid mats or decals in the tub or shower. If you need to sit down in the shower, use a plastic, non-slip stool. Keep the floor dry. Clean up any water that spills on the floor as soon as it happens. Remove soap buildup in the tub or shower regularly. Attach bath mats securely with double-sided non-slip rug tape. Do not have throw rugs and other things on the floor that can make you trip. What can I do in the bedroom? Use night lights. Make sure that you have a light by your bed that is easy to reach. Do not use any sheets or blankets that are too big for your bed. They should not hang down onto the floor. Have a firm chair that has side arms. You can use this for support while you get dressed. Do not have throw rugs and other things on the floor that can make you trip. What can I do in the kitchen? Clean up any spills  right away. Avoid walking on wet floors. Keep items that you use a lot in easy-to-reach places. If you need to reach something above you, use a strong step stool that has a grab bar. Keep electrical cords out of the way. Do not use floor polish or wax that makes floors slippery. If you must use wax, use non-skid floor wax. Do not have throw rugs and other things on the floor that can make you trip. What can I do with my stairs? Do not leave any items on the stairs. Make sure that there are handrails on both sides of the stairs and use them. Fix handrails that are broken or loose. Make sure that handrails are as long as the stairways. Check any carpeting to make sure that it is firmly attached to the stairs. Fix any carpet that is loose or worn. Avoid having throw rugs at the top or bottom of the stairs. If you do have throw rugs, attach them to the floor with carpet  tape. Make sure that you have a light switch at the top of the stairs and the bottom of the stairs. If you do not have them, ask someone to add them for you. What else can I do to help prevent falls? Wear shoes that: Do not have high heels. Have rubber bottoms. Are comfortable and fit you well. Are closed at the toe. Do not wear sandals. If you use a stepladder: Make sure that it is fully opened. Do not climb a closed stepladder. Make sure that both sides of the stepladder are locked into place. Ask someone to hold it for you, if possible. Clearly mark and make sure that you can see: Any grab bars or handrails. First and last steps. Where the edge of each step is. Use tools that help you move around (mobility aids) if they are needed. These include: Canes. Walkers. Scooters. Crutches. Turn on the lights when you go into a dark area. Replace any light bulbs as soon as they burn out. Set up your furniture so you have a clear path. Avoid moving your furniture around. If any of your floors are uneven, fix them. If there are any pets around you, be aware of where they are. Review your medicines with your doctor. Some medicines can make you feel dizzy. This can increase your chance of falling. Ask your doctor what other things that you can do to help prevent falls. This information is not intended to replace advice given to you by your health care provider. Make sure you discuss any questions you have with your health care provider. Document Released: 05/30/2009 Document Revised: 01/09/2016 Document Reviewed: 09/07/2014 Elsevier Interactive Patient Education  2017 [employer].

## 2022-09-29 NOTE — Progress Notes (Signed)
Subjective:   Alicia Rodgers is a 77 y.o. female who presents for Medicare Annual (Subsequent) preventive examination.  Review of Systems     Cardiac Risk Factors include: advanced age (>60mn, >>19women);diabetes mellitus;hypertension     Objective:    Today's Vitals   09/29/22 1311  BP: (!) 146/79  Pulse: 65  Temp: (!) 96.5 F (35.8 C)  TempSrc: Oral  SpO2: 98%  Weight: 137 lb 14.4 oz (62.6 kg)  Height: 5' 2"$  (1.575 m)   Body mass index is 25.22 kg/m.     09/29/2022    1:31 PM 10/06/2018   11:25 AM  Advanced Directives  Does Patient Have a Medical Advance Directive? No Yes  Type of [place]porate [place]f [place] will  Copy of [place] Chart?  No - copy requested  Would patient like information on creating a medical advance directive? No - Patient declined     Current Medications (verified) Outpatient Encounter Medications as of 09/29/2022  Medication Sig   allopurinol (ZYLOPRIM) 100 MG tablet Take 1 tablet (100 mg total) by mouth daily.   apixaban (ELIQUIS) 5 MG TABS tablet Take 1 tablet (5 mg total) by mouth 2 (two) times daily.   atenolol (TENORMIN) 25 MG tablet TAKE 1 TABLET BY MOUTH TWICE DAILY.   colchicine 0.6 MG tablet Take 1 tablet (0.6 mg total) by mouth daily. Take for gout flare. (Patient taking differently: Take 0.6 mg by mouth as needed. Take for gout flare.)   esomeprazole (NEXIUM) 20 MG capsule Take 20 mg by mouth daily as needed (heartburn).   hydrochlorothiazide (HYDRODIURIL) 12.5 MG tablet Take 1 tablet (12.5 mg total) by mouth daily.   Multiple Vitamins-Calcium (DAILY VITAMINS FOR WOMEN PO) Take 1 tablet by mouth daily.   pravastatin (PRAVACHOL) 10 MG tablet Take 1 tablet (10 mg total) by mouth daily.   valsartan (DIOVAN) 320 MG tablet Take 1 tablet (320 mg total) by mouth daily.   Facility-Administered Encounter Medications as of 09/29/2022  Medication   dexamethasone (DECADRON) injection 4 mg     Allergies (verified) Ciprofloxacin and Penicillins   History: Past Medical History:  Diagnosis Date   Anemia    Atrial fibrillation (HCC)    CERVICAL CANCER, HX OF    Fibrocystic breast    HYPERLIPIDEMIA    HYPERTENSION    Peripheral neuropathy    RHEUMATIC FEVER, HX OF    Past Surgical History:  Procedure Laterality Date   APPENDECTOMY     BREAST CYST EXCISION     BREAST EXCISIONAL BIOPSY Left    benign   CARDIOVERSION N/A 10/06/2018   Procedure: CARDIOVERSION;  Surgeon: NJosue Hector MD;  [facility];  Service: Cardiovascular;  Laterality: N/A;   CESAREAN SECTION     x3   TONSILLECTOMY     TOTAL ABDOMINAL HYSTERECTOMY     Family History  Problem Relation Age of Onset   Stroke Mother    Heart disease Father    Hypertension Brother    Hyperlipidemia Brother    Cancer Maternal Grandmother        Unsure   Heart disease Maternal Grandfather    Stroke Paternal Grandfather    Breast cancer Maternal Aunt    Breast cancer Paternal Aunt    Thyroid cancer Paternal Aunt    Social History   Socioeconomic History   Marital status: Widowed    Spouse name: Not on file   Number of children: 3  Years of education: 1 yr coll   Highest education level: Not on file  Occupational History   Occupation: Optometrist    Comment: Retired  Tobacco Use   Smoking status: Never    Passive exposure: Never   Smokeless tobacco: Never  Vaping Use   Vaping Use: Never used  Substance and Sexual Activity   Alcohol use: No   Drug use: No   Sexual activity: Not on file  Other Topics Concern   Not on file  Social History Narrative   Lives with her daughter and her family.   Right-handed.   1 cup caffeine daily.   Social Determinants of Health   Financial Resource Strain: Low Risk  (09/29/2022)   Overall Financial Resource Strain (CARDIA)    Difficulty of Paying Living Expenses: Not hard at all  Food Insecurity: No Food Insecurity (09/29/2022)   Hunger Vital Sign     Worried About Running Out of Food in the Last Year: Never true    Ran Out of Food in the Last Year: Never true  Transportation Needs: No Transportation Needs (09/29/2022)   PRAPARE - Hydrologist (Medical): No    Lack of Transportation (Non-Medical): No  Physical Activity: Insufficiently Active (09/29/2022)   Exercise Vital Sign    Days of Exercise per Week: 5 days    Minutes of Exercise per Session: 20 min  Stress: No Stress Concern Present (09/29/2022)   [place]    Feeling of Stress : Not at all  Social Connections: Moderately Integrated (09/29/2022)   Social Connection and Isolation Panel [NHANES]    Frequency of Communication with Friends and Family: More than three times a week    Frequency of Social Gatherings with Friends and Family: More than three times a week    Attends Religious Services: More than 4 times per year    Active Member of [employer] or Organizations: Yes    Attends Archivist Meetings: More than 4 times per year    Marital Status: Widowed    Tobacco Counseling Counseling given: Not Answered   Clinical Intake:  Pre-visit preparation completed: No  Pain : No/denies pain   Nutrition Risk Assessment:  Has the patient had any N/V/D within the last 2 months?  No  Does the patient have any non-healing wounds?  No  Has the patient had any unintentional weight loss or weight gain?  No   Diabetes:  Is the patient diabetic?  Yes  Pre Diabetic If diabetic, was a CBG obtained today?  No  Did the patient bring in their glucometer from home?  No  How often do you monitor your CBG's? PRN./A1c   Financial Strains and Diabetes Management:  Are you having any financial strains with the device, your supplies or your medication? No .  Does the patient want to be seen by Chronic Care Management for management of their diabetes?  No  Would the patient like to be  referred to a Nutritionist or for Diabetic Management?  No   Diabetic Exams:  Diabetic Eye Exam: Completed No. Overdue for diabetic eye exam. Pt has been advised about the importance in completing this exam. A referral has been placed today. Message sent to referral coordinator for scheduling purposes. Advised pt to expect a call from office referred to regarding appt.  Diabetic Foot Exam: Completed No. Pt has been advised about the importance in completing this exam. Pt is scheduled  for diabetic foot exam on Followed by PCP.    BMI - recorded: 25.22 Nutritional Status: BMI 25 -29 Overweight Nutritional Risks: None Diabetes: Yes CBG done?: No Did pt. bring in CBG monitor from home?: No  How often do you need to have someone help you when you read instructions, pamphlets, or other written materials from your doctor or pharmacy?: 1 - Never  Diabetic?  Yes  Interpreter Needed?: No  Comments: Rolene Arbour LPN   Activities of Daily Living    09/29/2022    1:30 PM  In your present state of health, do you have any difficulty performing the following activities:  Hearing? 0  Vision? 0  Difficulty concentrating or making decisions? 0  Walking or climbing stairs? 0  Dressing or bathing? 0  Doing errands, shopping? 0  Preparing Food and eating ? N  Using the Toilet? N  In the past six months, have you accidently leaked urine? N  Do you have problems with loss of bowel control? N  Managing your Medications? N  Managing your Finances? N  Housekeeping or managing your Housekeeping? N    Patient Care Team: [place], Blondell Reveal, MD as PCP - General (Family Medicine) Josue Hector, MD as PCP - Cardiology (Cardiology)  Indicate any recent Medical Services you may have received from other than [facility] providers in the past year (date may be approximate).     Assessment:   This is a routine wellness examination for [place].  Hearing/Vision screen Hearing Screening - Comments:: Denies  hearing difficulties   Vision Screening - Comments:: Wears reading glasses - up to date with routine eye exams with Dr Talbert Forest   Dietary issues and exercise activities discussed: Exercise limited by: None identified   Goals Addressed               This Visit's Progress     No Current goals (pt-stated)         Depression Screen    09/29/2022    1:28 PM  PHQ 2/9 Scores  PHQ - 2 Score 0    Fall Risk    09/29/2022    1:30 PM 06/23/2022    9:18 AM 04/17/2016    3:32 PM  Fall Risk   Falls in the past year? 0 0 No  Comment   Emmi Telephone Survey: data to providers prior to load  Number falls in past yr: 0 0   Injury with Fall? 0 0   Risk for fall due to : No Fall Risks No Fall Risks   Follow up Falls prevention discussed Falls evaluation completed     [place]:  Any stairs in or around the home? Yes  If so, are there any without handrails? No  Home free of loose throw rugs in walkways, pet beds, electrical cords, etc? Yes  Adequate lighting in your home to reduce risk of falls? Yes   ASSISTIVE DEVICES UTILIZED TO PREVENT FALLS:  Life alert? No  Use of a cane, walker or w/c? No  Grab bars in the bathroom? No  Shower chair or bench in shower? No  Elevated toilet seat or a handicapped toilet? No   TIMED UP AND GO:  Was the test performed? Yes .  Length of time to ambulate 10 feet: 10 sec.   Gait steady and fast without use of assistive device  Cognitive Function:        09/29/2022    1:31 PM  6CIT Screen  What Year? 0 points  What month? 0 points  What time? 0 points  Count back from 20 0 points  Months in reverse 0 points  Repeat phrase 0 points  Total Score 0 points    Immunizations Immunization History  Administered Date(s) Administered   Influenza, Quadrivalent, Recombinant, Inj, Pf 06/23/2022   PFIZER(Purple Top)SARS-COV-2 Vaccination 10/27/2019, 11/22/2019    TDAP status: Due, Education has been provided  regarding the importance of this vaccine. Advised may receive this vaccine at local pharmacy or Health Dept. Aware to provide a copy of the vaccination record if obtained from local pharmacy or Health Dept. Verbalized acceptance and understanding.  Flu Vaccine status: Up to date  Pneumococcal vaccine status: Due, Education has been provided regarding the importance of this vaccine. Advised may receive this vaccine at local pharmacy or Health Dept. Aware to provide a copy of the vaccination record if obtained from local pharmacy or Health Dept. Verbalized acceptance and understanding.  Covid-19 vaccine status: Completed vaccines  Qualifies for Shingles Vaccine? Yes   Zostavax completed No   Shingrix Completed?: No.    Education has been provided regarding the importance of this vaccine. Patient has been advised to call insurance company to determine out of pocket expense if they have not yet received this vaccine. Advised may also receive vaccine at local pharmacy or Health Dept. Verbalized acceptance and understanding.  Screening Tests Health Maintenance  Topic Date Due   DTaP/Tdap/Td (1 - Tdap) Never done   OPHTHALMOLOGY EXAM  09/29/2022 (Originally 01/12/1956)   Diabetic kidney evaluation - Urine ACR  09/30/2022 (Originally 01/12/1964)   FOOT EXAM  09/30/2022 (Originally 01/12/1956)   COVID-19 Vaccine (3 - 2023-24 season) 10/15/2022 (Originally 04/17/2022)   Zoster Vaccines- Shingrix (1 of 2) 12/28/2022 (Originally 01/12/1995)   Pneumonia Vaccine 78+ Years old (1 of 1 - PCV) 09/30/2023 (Originally 01/11/2010)   Hepatitis C Screening  09/30/2023 (Originally 01/12/1964)   HEMOGLOBIN A1C  12/22/2022   Diabetic kidney evaluation - eGFR measurement  06/05/2023   Medicare Annual Wellness (AWV)  09/30/2023   INFLUENZA VACCINE  Completed   DEXA SCAN  Completed   HPV VACCINES  Aged Out    Health Maintenance  Health Maintenance Due  Topic Date Due   DTaP/Tdap/Td (1 - Tdap) Never done     Colorectal cancer screening: No longer required.   Mammogram status: No longer required due to Age.  Bone Density status: Completed 01/12/18. Results reflect: Bone density results: OSTEOPOROSIS. Repeat every   years.  Lung Cancer Screening: (Low Dose CT Chest recommended if Age 80-80 years, 30 pack-year currently smoking OR have quit w/in 15years.) does not qualify.     Additional Screening:  Hepatitis C Screening: does qualify; Deferred  Vision Screening: Recommended annual ophthalmology exams for early detection of glaucoma and other disorders of the eye. Is the patient up to date with their annual eye exam?  Yes  Who is the provider or what is the name of the office in which the patient attends annual eye exams? Dr Talbert Forest If pt is not established with a provider, would they like to be referred to a provider to establish care? No .   Dental Screening: Recommended annual dental exams for proper oral hygiene  Community Resource Referral / Chronic Care Management:  CRR required this visit?  No   CCM required this visit?  No      Plan:     I have personally reviewed and noted the following in  the patient's chart:   Medical and social history Use of alcohol, tobacco or illicit drugs  Current medications and supplements including opioid prescriptions. Patient is not currently taking opioid prescriptions. Functional ability and status Nutritional status Physical activity Advanced directives List of other physicians Hospitalizations, surgeries, and ER visits in previous 12 months Vitals Screenings to include cognitive, depression, and falls Referrals and appointments  In addition, I have reviewed and discussed with patient certain preventive protocols, quality metrics, and best practice recommendations. A written personalized care plan for preventive services as well as general preventive health recommendations were provided to patient.     Criselda Peaches,  LPN   [id number]   Nurse Notes: Patient due Diabetic kidney evaluation- Urine ACR and Hep=C Screening

## 2022-10-09 DIAGNOSIS — Z961 Presence of intraocular lens: Secondary | ICD-10-CM | POA: Diagnosis not present

## 2022-10-09 DIAGNOSIS — H18413 Arcus senilis, bilateral: Secondary | ICD-10-CM | POA: Diagnosis not present

## 2022-10-09 DIAGNOSIS — H02839 Dermatochalasis of unspecified eye, unspecified eyelid: Secondary | ICD-10-CM | POA: Diagnosis not present

## 2022-10-09 DIAGNOSIS — H04123 Dry eye syndrome of bilateral lacrimal glands: Secondary | ICD-10-CM | POA: Diagnosis not present

## 2022-10-12 NOTE — Progress Notes (Unsigned)
Office Visit Note  Patient: Alicia Rodgers             Date of Birth: [birth year]           MRN: [id number]             PCP: de [place], [name], MD Referring: Aretta Nip, MD Visit Date: 10/13/2022   Subjective:  No chief complaint on file.   History of Present Illness: Alicia Rodgers is a 77 y.o. female here for follow up ***   Previous HPI 10/14/21 Alicia Rodgers is a 77 y.o. female here for follow up for gout on allopurinol 100 mg daily.  She has no new flare up of gout since our last visit in November and no trouble taking the allopurinol. She remains very adherent to a low purine diet her daughter is very health conscious and helps keep track of this such as rarely eating sweets.   Previous HPI 07/15/21 Alicia Rodgers is a 77 y.o. female here for follow up for gout 1 month follow up after starting allopurinol 100 mg PO daily. Uric acid was 7.7 and slow dose titration started due to CKD. She did not notice any trouble starting the allopurinol. She suffered a flare up of pain and swelling in the left foot starting in the forefoot eventually extending to the whole foot below the ankle within 2-3 days. She saw the ED and was prescribed mitigare and took as directed total of 3 tablets after an hour and experienced 12 hours of severe watery diarrhea afterwards but foot symptoms have improved. She still has mild swelling left, and feels partial numbness sensation in the forefoot.    Previous HPI 06/06/21 Alicia Rodgers is a 77 y.o. female here for gouty arthritis of the right foot with recurrent episodes treated with local steroid injection and most recent with daily colchicine. This started with the first time about 2 years ago and now 4 total episodes. This year was worse with one episode in June and another in September, with the last time taking a month to resolve. She has had local steroid injections to the 1st MTP joint usually with good relief but the last time slower to improve. Xrays  from earlier this year demonstrated osteoarthritis change in the midfoot, with past traumatic injury to the site, there was no erosive disease at the MTPs. She is on eliquis anticoagulation for permanent Afib. She previously took dyazide but not on any current diuretic treatments.   No Rheumatology ROS completed.   PMFS History:  Patient Active Problem List   Diagnosis Date Noted   Longstanding persistent atrial fibrillation ([place]) 08/13/2022   Type 2 diabetes mellitus with other specified complication ([place]) 123456   Podagra 06/06/2021   CKD (chronic kidney disease) 06/06/2021   Post-traumatic osteoarthritis, right ankle and foot 06/06/2021   Paresthesia 01/21/2016   PVC (premature ventricular contraction) 12/07/2011   Elevated lipids 03/29/2009   Essential hypertension 03/29/2009   CERVICAL CANCER, HX OF 03/29/2009   RHEUMATIC FEVER, HX OF 03/29/2009    Past Medical History:  Diagnosis Date   Anemia    Atrial fibrillation (HCC)    CERVICAL CANCER, HX OF    Fibrocystic breast    HYPERLIPIDEMIA    HYPERTENSION    Peripheral neuropathy    RHEUMATIC FEVER, HX OF     Family History  Problem Relation Age of Onset   Stroke Mother    Heart disease Father  Hypertension Brother    Hyperlipidemia Brother    Cancer Maternal Grandmother        Unsure   Heart disease Maternal Grandfather    Stroke Paternal Grandfather    Breast cancer Maternal Aunt    Breast cancer Paternal Aunt    Thyroid cancer Paternal Aunt    Past Surgical History:  Procedure Laterality Date   APPENDECTOMY     BREAST CYST EXCISION     BREAST EXCISIONAL BIOPSY Left    benign   CARDIOVERSION N/A 10/06/2018   Procedure: CARDIOVERSION;  Surgeon: Josue Hector, MD;  Location: [facility];  Service: Cardiovascular;  Laterality: N/A;   CESAREAN SECTION     x3   TONSILLECTOMY     TOTAL ABDOMINAL HYSTERECTOMY     Social History   Social History Narrative   Lives with her daughter and her family.    Right-handed.   1 cup caffeine daily.   Immunization History  Administered Date(s) Administered   Influenza, Quadrivalent, Recombinant, Inj, Pf 06/23/2022   PFIZER(Purple Top)SARS-COV-2 Vaccination 10/27/2019, 11/22/2019     Objective: Vital Signs: There were no vitals taken for this visit.   Physical Exam   Musculoskeletal Exam: ***  CDAI Exam: CDAI Score: -- Patient Global: --; Provider Global: -- Swollen: --; Tender: -- Joint Exam 10/13/2022   No joint exam has been documented for this visit   There is currently no information documented on the homunculus. Go to the Rheumatology activity and complete the homunculus joint exam.  Investigation: No additional findings.  Imaging: No results found.  Recent Labs: Lab Results  Component Value Date   WBC 6.6 04/07/2022   HGB 14.1 04/07/2022   PLT 205 04/07/2022   NA 142 06/04/2022   K 3.7 06/04/2022   CL 103 06/04/2022   CO2 22 06/04/2022   GLUCOSE 102 (H) 06/04/2022   BUN 17 06/04/2022   CREATININE 1.19 (H) 06/04/2022   BILITOT 0.6 12/30/2021   ALKPHOS 77 12/30/2021   AST 19 12/30/2021   ALT 19 12/30/2021   PROT 7.1 12/30/2021   ALBUMIN 4.2 12/30/2021   CALCIUM 9.9 06/04/2022   GFRAA 55 (L) 08/21/2019    Speciality Comments: No specialty comments available.  Procedures:  No procedures performed Allergies: Ciprofloxacin and Penicillins   Assessment / Plan:     Visit Diagnoses: No diagnosis found.  ***  Orders: No orders of the defined types were placed in this encounter.  No orders of the defined types were placed in this encounter.    Follow-Up Instructions: No follow-ups on file.   Collier Salina, MD  Note - This record has been created using [employer].  Chart creation errors have been sought, but may not always  have been located. Such creation errors do not reflect on  the standard of medical care.

## 2022-10-13 ENCOUNTER — Encounter: Payer: Self-pay | Admitting: Internal Medicine

## 2022-10-13 ENCOUNTER — Ambulatory Visit: Payer: Medicare Other | Attending: Internal Medicine | Admitting: Internal Medicine

## 2022-10-13 VITALS — BP 148/72 | HR 67 | Resp 12 | Ht 62.0 in | Wt 139.0 lb

## 2022-10-13 DIAGNOSIS — N189 Chronic kidney disease, unspecified: Secondary | ICD-10-CM | POA: Diagnosis not present

## 2022-10-13 DIAGNOSIS — M19171 Post-traumatic osteoarthritis, right ankle and foot: Secondary | ICD-10-CM

## 2022-10-13 DIAGNOSIS — N183 Chronic kidney disease, stage 3 unspecified: Secondary | ICD-10-CM

## 2022-10-13 DIAGNOSIS — M109 Gout, unspecified: Secondary | ICD-10-CM | POA: Diagnosis not present

## 2022-10-14 LAB — URIC ACID: Uric Acid, Serum: 6.9 mg/dL (ref 2.5–7.0)

## 2022-10-16 MED ORDER — ALLOPURINOL 100 MG PO TABS: 100.0000 mg | ORAL_TABLET | Freq: Every day | ORAL | 3 refills | Status: DC

## 2022-10-16 NOTE — Addendum Note (Signed)
Addended by: Collier Salina on: 10/16/2022 04:39 PM   Modules accepted: Orders

## 2022-10-16 NOTE — Progress Notes (Signed)
Uric acid is 6.9 slightly higher than ideal for gout but with no recent symptoms she is okay to continue on allopurinol 100 mg 1 pill daily.

## 2022-12-15 NOTE — Progress Notes (Signed)
Cardiology Office Note   Date:  12/25/2022   ID:  Khyli, Weyer 11-Oct-1945, MRN [id number]  PCP:  [place], Alicia Rodgers  Cardiologist:  Dr. Eden Rodgers     No chief complaint on file.      History of Present Illness: Alicia Rodgers is a 77 y.o. female who presents for atrial fib. Diagnosed on office visit 08/25/18 TTE normal EF LA 44 mm  [facility] 10/06/18 with conversion but ERAF Decision made to rate control CHADVAS 3 on Eliquis and beta blocker   Eliquis is expensive until she reaches her deductible   No cardiac issues History of gout in right foot Rx colchicine   Seen in ED 04/07/22 with headache and elevated BP.  CT/MRI head negative Noted 2 cm tyroid nodule being w/u now [place] FNA BX done 11/15  BP improved on current meds My reading today 130/80 mmHg  TTE 07/29/22 reviewed EF 60-65% mild MR severe bi atrial enlargement   Seems to be developing mild DM and neuropathy in feet  Past Medical History:  Diagnosis Date   Anemia    Atrial fibrillation (HCC)    CERVICAL CANCER, HX OF    Fibrocystic breast    HYPERLIPIDEMIA    HYPERTENSION    Peripheral neuropathy    RHEUMATIC FEVER, HX OF     Past Surgical History:  Procedure Laterality Date   APPENDECTOMY     BREAST CYST EXCISION     BREAST EXCISIONAL BIOPSY Left    benign   CARDIOVERSION N/A 10/06/2018   Procedure: CARDIOVERSION;  Surgeon: Wendall Stade, Rodgers;  Location: [facility] ENDOSCOPY;  Service: Cardiovascular;  Laterality: N/A;   CESAREAN SECTION     x3   TONSILLECTOMY     TOTAL ABDOMINAL HYSTERECTOMY       Current Outpatient Medications  Medication Sig Dispense Refill   allopurinol (ZYLOPRIM) 100 MG tablet Take 1 tablet (100 mg total) by mouth daily. 90 tablet 3   apixaban (ELIQUIS) 5 MG TABS tablet Take 1 tablet (5 mg total) by mouth 2 (two) times daily. 180 tablet 3   atenolol (TENORMIN) 25 MG tablet TAKE 1 TABLET BY MOUTH TWICE DAILY. 180 tablet 3   colchicine 0.6 MG tablet Take 0.6 mg by mouth as needed (gout).      esomeprazole (NEXIUM) 20 MG capsule Take 20 mg by mouth daily as needed (heartburn).     hydrochlorothiazide (HYDRODIURIL) 12.5 MG tablet Take 1 tablet (12.5 mg total) by mouth daily. 90 tablet 3   Multiple Vitamins-Calcium (DAILY VITAMINS FOR WOMEN PO) Take 1 tablet by mouth daily.     pravastatin (PRAVACHOL) 10 MG tablet Take 1 tablet (10 mg total) by mouth daily. 90 tablet 3   valsartan (DIOVAN) 320 MG tablet Take 1 tablet (320 mg total) by mouth daily. 90 tablet 3   colchicine 0.6 MG tablet Take 1 tablet (0.6 mg total) by mouth daily. Take for gout flare. (Patient not taking: Reported on 12/25/2022) 30 tablet 2   Current Facility-Administered Medications  Medication Dose Route Frequency Provider Last Rate Last Admin   dexamethasone (DECADRON) injection 4 mg  4 mg Intra-articular Once Vivi Barrack, DPM        Allergies:   Ciprofloxacin and Penicillins    Social History:  The patient  reports that she has never smoked. She has never been exposed to tobacco smoke. She has never used smokeless tobacco. She reports that she does not drink alcohol and does not use  drugs.   Family History:  The patient's family history includes Breast cancer in her maternal aunt and paternal aunt; Cancer in her maternal grandmother; Heart disease in her father and maternal grandfather; Hyperlipidemia in her brother; Hypertension in her brother; Stroke in her mother and paternal grandfather; Thyroid cancer in her paternal aunt.    ROS:  General:no colds or fevers currently, no weight changes Skin:no rashes or ulcers HEENT:one episode of blurred vision with rapid HR, no congestion CV:see HPI PUL:see HPI GI:no diarrhea constipation or melena, no indigestion GU:no hematuria, no dysuria MS:no joint pain, no claudication Neuro:no syncope, no lightheadedness Endo:no diabetes, no thyroid disease  Wt Readings from Last 3 Encounters:  12/25/22 139 lb 3.2 oz (63.1 kg)  12/23/22 139 lb 4.8 oz (63.2 kg)   10/13/22 139 lb (63 kg)     PHYSICAL EXAM: VS:  BP 120/68   Pulse 65   Ht 5\' 3"  (1.6 m)   Wt 139 lb 3.2 oz (63.1 kg)   SpO2 99%   BMI 24.66 kg/m  , BMI Body mass index is 24.66 kg/m.  Affect appropriate Healthy:  appears stated age HEENT: palpable right thyroid nodule  Neck supple with no adenopathy JVP normal no bruits no thyromegaly Lungs clear with no wheezing and good diaphragmatic motion Heart:  S1/S2 1/6 SEM murmur, no rub, gallop or click PMI normal Abdomen: benighn, BS positve, no tenderness, no AAA no bruit.  No HSM or HJR Distal pulses intact with no bruits No edema Neuro non-focal Skin warm and dry No muscular weakness  EKG:    Atrial flutter rate 67 nonspecific ST changes 12/25/2022 afib rate 66 PVC no acute changes   Recent Labs: 04/07/2022: Hemoglobin 14.1; Platelets 205 04/14/2022: TSH 1.530 12/23/2022: ALT 25; BUN 22; Creatinine, Ser 1.13; Potassium 4.0; Sodium 141    Lipid Panel    Component Value Date/Time   CHOL 183 12/30/2021 0900   TRIG 97 12/30/2021 0900   HDL 54 12/30/2021 0900   CHOLHDL 3.4 12/30/2021 0900   LDLCALC 111 (H) 12/30/2021 0900     Other studies Reviewed: Additional studies/ records that were reviewed today include: . Echo 08/2018 Study Conclusions   - Left ventricle: The cavity size was normal. Systolic function was   normal. The estimated ejection fraction was in the range of 60%   to 65%. Wall motion was normal; there were no regional wall   motion abnormalities. - Mitral valve: Valve area by continuity equation (using LVOT   flow): 2.52 cm^2. - Left atrium: The atrium was severely dilated. - Right atrium: The atrium was mildly dilated. - Pulmonary arteries: Systolic pressure was within the normal   range. PA peak pressure: 34 mm Hg (S).    ASSESSMENT AND PLAN:  1.  Afib: permanent rate control and anticoagulation good Severe LAE failed [facility] Labs done by primary  [name] q 6 months  EF preserved by TTE  07/29/22   2. Hx of COVID CXR 08/21/19 NAD Received vaccine April 2021      3.  Murmur with clindamycin for dental procedures with hx of rheumatic fever. Echo done 07/29/22 with just mild MR AV sclerosis and trivial AR   4  HTN : Well controlled.  Continue current medications and low sodium Dash type diet.    5. HLD:  On statin labs with primary Discussed utility of calcium score  6. Gout:  post rx steroid injection and colchicine has f/u with rheumatology on allopurinol and adhering to low  purine diet Sees DR Sheliah Hatch Rheumat[facility]   7. Thyroid:  2 cm right nodule for biopsy f/u endocrine/primary Benign follicular nodule  8. Neuropathy:  peripheral in feet f/u primary for labs ? Start Gabapentin  9. DM-2  diet control for now A1c 6.2     Disposition:   FU: 6 months   Signed, Charlton Haws, Rodgers  12/25/2022 9:38 AM    [facility] [address] [place], [place], [place]  [address]/ [address] [place], [place] Phone: [phone]; Fax: [phone]  [phone]

## 2022-12-22 ENCOUNTER — Ambulatory Visit (HOSPITAL_BASED_OUTPATIENT_CLINIC_OR_DEPARTMENT_OTHER): Payer: Medicare Other | Admitting: Family Medicine

## 2022-12-23 ENCOUNTER — Encounter (HOSPITAL_BASED_OUTPATIENT_CLINIC_OR_DEPARTMENT_OTHER): Payer: Self-pay | Admitting: Family Medicine

## 2022-12-23 ENCOUNTER — Ambulatory Visit (INDEPENDENT_AMBULATORY_CARE_PROVIDER_SITE_OTHER): Payer: Medicare Other | Admitting: Family Medicine

## 2022-12-23 VITALS — BP 154/78 | HR 56 | Ht 62.0 in | Wt 139.3 lb

## 2022-12-23 DIAGNOSIS — R7303 Prediabetes: Secondary | ICD-10-CM | POA: Insufficient documentation

## 2022-12-23 DIAGNOSIS — E1169 Type 2 diabetes mellitus with other specified complication: Secondary | ICD-10-CM | POA: Diagnosis not present

## 2022-12-23 DIAGNOSIS — I10 Essential (primary) hypertension: Secondary | ICD-10-CM | POA: Diagnosis not present

## 2022-12-23 LAB — COMPREHENSIVE METABOLIC PANEL
ALT: 25 IU/L (ref 0–32)
AST: 22 IU/L (ref 0–40)
Albumin/Globulin Ratio: 1.7 (ref 1.2–2.2)
Albumin: 4.3 g/dL (ref 3.8–4.8)
Alkaline Phosphatase: 77 IU/L (ref 44–121)
BUN/Creatinine Ratio: 19 (ref 12–28)
BUN: 22 mg/dL (ref 8–27)
Bilirubin Total: 0.7 mg/dL (ref 0.0–1.2)
CO2: 21 mmol/L (ref 20–29)
Calcium: 10 mg/dL (ref 8.7–10.3)
Chloride: 103 mmol/L (ref 96–106)
Creatinine, Ser: 1.13 mg/dL — ABNORMAL HIGH (ref 0.57–1.00)
Globulin, Total: 2.5 g/dL (ref 1.5–4.5)
Glucose: 111 mg/dL — ABNORMAL HIGH (ref 70–99)
Potassium: 4 mmol/L (ref 3.5–5.2)
Sodium: 141 mmol/L (ref 134–144)
Total Protein: 6.8 g/dL (ref 6.0–8.5)
eGFR: 50 mL/min/{1.73_m2} — ABNORMAL LOW (ref >59)

## 2022-12-23 LAB — POCT GLYCOSYLATED HEMOGLOBIN (HGB A1C): Hemoglobin A1C: 6.3 % — AB (ref 4.0–5.6)

## 2022-12-23 NOTE — Progress Notes (Signed)
   Established Patient Office Visit  Subjective   Patient ID: Alicia Rodgers, female    DOB: [birth year]  Age: 77 y.o. MRN: [id number]  Alicia Rodgers is a 77 yo female patient who presents for diabetes follow-up.   Pre-DM: Last A1c 6.2 on 06/23/2022 Focusing on lifestyle modifications- she is getting back to walking now that weather is getting nice- starting at 20 mins and plans to increase to 30 mins. Does report some shortness of breath with exertion due to her afib.  POCT A1c today 6.3  Limits sugar intake, tries to watch carbohydrates intake  Following mediterranean diet, daughter is focused on healthy diet   Denies chest pain, shortness of breath, vision changes, polydipsia, polyphagia, polyuria.   HTN: atenolol 25mg , HCTZ 12.5mg , valsartan 320mg   Home blood pressure readings: 135/60-70s Reports she does have white coat HTN  Patient has history of afib   Review of Systems  Constitutional:  Negative for malaise/fatigue.  Eyes:  Negative for blurred vision and double vision.  Respiratory:  Negative for cough and shortness of breath.   Cardiovascular:  Negative for chest pain and palpitations.  Gastrointestinal:  Negative for abdominal pain, nausea and vomiting.  Genitourinary:  Negative for frequency and urgency.  Musculoskeletal:  Negative for myalgias.  Neurological:  Negative for dizziness, weakness and headaches.  Endo/Heme/Allergies:  Negative for polydipsia.  Psychiatric/Behavioral:  Negative for depression and suicidal ideas. The patient is not nervous/anxious.       Objective:     BP (!) 154/78   Pulse (!) 56   Ht 5\' 2"  (1.575 m)   Wt 139 lb 4.8 oz (63.2 kg)   SpO2 100%   BMI 25.48 kg/m  BP Readings from Last 3 Encounters:  12/23/22 (!) 154/78  10/13/22 (!) 148/72  09/29/22 (!) 146/79     Physical Exam Constitutional:      Appearance: Normal appearance.  Cardiovascular:     Rate and Rhythm: Normal rate. Rhythm irregularly irregular.     Heart sounds:  Normal heart sounds.  Pulmonary:     Effort: Pulmonary effort is normal.     Breath sounds: Normal breath sounds.  Neurological:     Mental Status: She is alert.    Assessment & Plan:  1. Pre-diabetes A1c rechecked today at 6.3. Advised patient to continue focusing on healthy lifestyle modifications- including daily exercise and healthy food choices. Patient able to void today- will complete urine microalbumin/creatinine ratio. Will obtain CMP today to assess kidney function and will also obtain lipid panel to assess ASCVD risk. Follow-up in 3-6 months for A1c recheck.  - POCT HgB A1C - Comprehensive metabolic panel - Lipid Profile  2. Essential hypertension Blood pressure slightly elevated today, even after blood pressure recheck. Patient advises that she does suffer from white-coat hypertension. She does check her blood pressure readings at home and reports that they are often 120-130s/60-70s. Current medication regimen includes atenolol 25mg , HCTZ 12.5mg , and valsartan 320mg . Advised her to watch sodium intake. Denies chest pain, changes in vision, shortness of breath at rest, lower extremity edema, headaches, lightheadedness, weakness, cough. Cardiovascular exam with heart regular irregularly irregular rate and rhythm. Normal heart sounds, no murmurs present. No lower extremity edema present. Lungs clear to auscultation bilaterally. No medication changes made today. No refills requested today. - Lipid Profile    Return in about 3 months (around 03/25/2023) for Diabetes f/u.    Alyson Reedy, FNP

## 2022-12-24 LAB — MICROALBUMIN / CREATININE URINE RATIO
Creatinine, Urine: 180 mg/dL
Microalb/Creat Ratio: 5 mg/g creat (ref 0–29)
Microalbumin, Urine: 9.6 ug/mL

## 2022-12-25 ENCOUNTER — Encounter: Payer: Self-pay | Admitting: Cardiovascular Disease

## 2022-12-25 ENCOUNTER — Ambulatory Visit: Payer: Medicare Other | Attending: Cardiovascular Disease | Admitting: Cardiovascular Disease

## 2022-12-25 VITALS — BP 120/68 | HR 65 | Ht 63.0 in | Wt 139.2 lb

## 2022-12-25 DIAGNOSIS — E782 Mixed hyperlipidemia: Secondary | ICD-10-CM | POA: Diagnosis not present

## 2022-12-25 DIAGNOSIS — I10 Essential (primary) hypertension: Secondary | ICD-10-CM | POA: Diagnosis not present

## 2022-12-25 DIAGNOSIS — I4821 Permanent atrial fibrillation: Secondary | ICD-10-CM | POA: Diagnosis not present

## 2022-12-25 DIAGNOSIS — E041 Nontoxic single thyroid nodule: Secondary | ICD-10-CM | POA: Diagnosis not present

## 2022-12-25 MED ORDER — PRAVASTATIN SODIUM 10 MG PO TABS: 10.0000 mg | ORAL_TABLET | Freq: Every day | ORAL | 3 refills | Status: DC

## 2022-12-25 MED ORDER — HYDROCHLOROTHIAZIDE 12.5 MG PO TABS: 12.5000 mg | ORAL_TABLET | Freq: Every day | ORAL | 3 refills | Status: DC

## 2022-12-25 NOTE — Patient Instructions (Signed)
Medication Instructions:  Your physician recommends that you continue on your current medications as directed. Please refer to the Current Medication list given to you today.  *If you need a refill on your cardiac medications before your next appointment, please call your pharmacy*  Lab Work: If you have labs (blood work) drawn today and your tests are completely normal, you will receive your results only by: MyChart Message (if you have MyChart) OR A paper copy in the mail If you have any lab test that is abnormal or we need to change your treatment, we will call you to review the results.  Testing/Procedures: None ordered today.  Follow-Up: At [facility], you and your health needs are our priority.  As part of our continuing mission to provide you with exceptional heart care, we have created designated Provider Care Teams.  These Care Teams include your primary Cardiologist (physician) and Advanced Practice Providers (APPs -  Physician Assistants and Nurse Practitioners) who all work together to provide you with the care you need, when you need it.  We recommend signing up for the patient portal called "MyChart".  Sign up information is provided on this After Visit Summary.  MyChart is used to connect with patients for Virtual Visits (Telemedicine).  Patients are able to view lab/test results, encounter notes, upcoming appointments, etc.  Non-urgent messages can be sent to your provider as well.   To learn more about what you can do with MyChart, go to [email or web address].    Your next appointment:   6 month(s)  Provider:   Peter Nishan, MD     

## 2022-12-27 ENCOUNTER — Other Ambulatory Visit (HOSPITAL_BASED_OUTPATIENT_CLINIC_OR_DEPARTMENT_OTHER): Payer: Self-pay | Admitting: Family Medicine

## 2022-12-27 DIAGNOSIS — R7303 Prediabetes: Secondary | ICD-10-CM

## 2023-03-25 ENCOUNTER — Encounter (HOSPITAL_BASED_OUTPATIENT_CLINIC_OR_DEPARTMENT_OTHER): Payer: Self-pay | Admitting: Family Medicine

## 2023-03-25 ENCOUNTER — Ambulatory Visit (INDEPENDENT_AMBULATORY_CARE_PROVIDER_SITE_OTHER): Payer: Medicare Other | Admitting: Family Medicine

## 2023-03-25 VITALS — BP 168/94 | HR 66 | Ht 63.0 in | Wt 139.0 lb

## 2023-03-25 DIAGNOSIS — R7303 Prediabetes: Secondary | ICD-10-CM | POA: Diagnosis not present

## 2023-03-25 DIAGNOSIS — I10 Essential (primary) hypertension: Secondary | ICD-10-CM

## 2023-03-25 DIAGNOSIS — E041 Nontoxic single thyroid nodule: Secondary | ICD-10-CM | POA: Insufficient documentation

## 2023-03-25 NOTE — Assessment & Plan Note (Signed)
Uncertain history of prediabetes versus diabetes mellitus.  A1c available for review in chart from labs that we have done have been within prediabetes range.  Patient denies ever being told about diagnosis of diabetes.  On reviewing records through Care Everywhere, can see billing including diagnosis of type 2 diabetes, however cannot see documentation.  We will need to request records to review prior labs clarify diagnosis At this time, patient continues with lifestyle modifications, no medications utilized No current issues with polyuria or polydipsia For now we can continue with lifestyle modifications, plan to manage as possible diabetes for monitoring of any possible complications, however can consider adjusting this pending review of outside records

## 2023-03-25 NOTE — Assessment & Plan Note (Signed)
Patient has had evaluation with Dr. Talmage Nap related to multinodular goiter/thyroid nodule.  She did have thyroid nodule biopsy completed and reports that this was reassuring.  She indicates that she will have follow-up in 1 year for monitoring.  She does not have any concerns today, cannot any new symptoms

## 2023-03-25 NOTE — Progress Notes (Signed)
    Procedures performed today:    None.  Independent interpretation of notes and tests performed by another provider:   None.  Brief History, Exam, Impression, and Recommendations:    BP (!) 168/94 (BP Location: Left Arm, Patient Position: Sitting, Cuff Size: Normal)   Pulse 66   Ht 5\' 3"  (1.6 m)   Wt 139 lb (63 kg)   SpO2 100%   BMI 24.62 kg/m   Essential hypertension Assessment & Plan: Blood pressure initially elevated in office, did improve slightly on recheck.  Patient continues with atenolol, hydrochlorothiazide, valsartan.  She continues to follow with cardiology as well, last visit was about 3 months ago.  At last visit with cardiology, blood pressure was better controlled then.  She does occasionally check her blood pressure at home, however typically she feels that she will know when her blood pressure is running high and will check it at that time.  No current issues with chest pain, headaches, lightheadedness or dizziness. On exam, patient in no acute distress, cardiovascular exam with irregularly irregular rhythm, normal rate.  Lungs clear to auscultation bilaterally. At this time, can continue with current medication regimen, no changes today Recommend continuing with follow-up with cardiology as scheduled.  Recommend intermittent monitoring of blood pressure at home, DASH diet   Pre-diabetes Assessment & Plan: Uncertain history of prediabetes versus diabetes mellitus.  A1c available for review in chart from labs that we have done have been within prediabetes range.  Patient denies ever being told about diagnosis of diabetes.  On reviewing records through Care Everywhere, can see billing including diagnosis of type 2 diabetes, however cannot see documentation.  We will need to request records to review prior labs clarify diagnosis At this time, patient continues with lifestyle modifications, no medications utilized No current issues with polyuria or polydipsia For now  we can continue with lifestyle modifications, plan to manage as possible diabetes for monitoring of any possible complications, however can consider adjusting this pending review of outside records   Thyroid nodule Assessment & Plan: Patient has had evaluation with Dr. Talmage Nap related to multinodular goiter/thyroid nodule.  She did have thyroid nodule biopsy completed and reports that this was reassuring.  She indicates that she will have follow-up in 1 year for monitoring.  She does not have any concerns today, cannot any new symptoms   Return in about 4 months (around 07/25/2023) for hypertension, prediabetes.   ___________________________________________  [place], MD, ABFM, CAQSM Primary Care and Sports Medicine [facility]

## 2023-03-25 NOTE — Assessment & Plan Note (Signed)
Blood pressure initially elevated in office, did improve slightly on recheck.  Patient continues with atenolol, hydrochlorothiazide, valsartan.  She continues to follow with cardiology as well, last visit was about 3 months ago.  At last visit with cardiology, blood pressure was better controlled then.  She does occasionally check her blood pressure at home, however typically she feels that she will know when her blood pressure is running high and will check it at that time.  No current issues with chest pain, headaches, lightheadedness or dizziness. On exam, patient in no acute distress, cardiovascular exam with irregularly irregular rhythm, normal rate.  Lungs clear to auscultation bilaterally. At this time, can continue with current medication regimen, no changes today Recommend continuing with follow-up with cardiology as scheduled.  Recommend intermittent monitoring of blood pressure at home, DASH diet

## 2023-04-20 ENCOUNTER — Other Ambulatory Visit (HOSPITAL_BASED_OUTPATIENT_CLINIC_OR_DEPARTMENT_OTHER): Payer: Self-pay | Admitting: Family

## 2023-04-20 DIAGNOSIS — I10 Essential (primary) hypertension: Secondary | ICD-10-CM

## 2023-04-20 NOTE — Telephone Encounter (Signed)
Patient of Dr. Nishan. Please review for refill. Thank you!  

## 2023-04-26 ENCOUNTER — Ambulatory Visit (HOSPITAL_BASED_OUTPATIENT_CLINIC_OR_DEPARTMENT_OTHER): Payer: Medicare Other | Admitting: Family Medicine

## 2023-04-26 ENCOUNTER — Encounter (HOSPITAL_BASED_OUTPATIENT_CLINIC_OR_DEPARTMENT_OTHER): Payer: Self-pay | Admitting: Family Medicine

## 2023-04-26 ENCOUNTER — Ambulatory Visit (HOSPITAL_BASED_OUTPATIENT_CLINIC_OR_DEPARTMENT_OTHER): Payer: Medicare Other

## 2023-04-26 ENCOUNTER — Encounter (HOSPITAL_BASED_OUTPATIENT_CLINIC_OR_DEPARTMENT_OTHER): Payer: Self-pay

## 2023-04-26 VITALS — BP 156/82 | HR 66 | Temp 98.1°F | Ht 63.0 in | Wt 140.0 lb

## 2023-04-26 DIAGNOSIS — R0989 Other specified symptoms and signs involving the circulatory and respiratory systems: Secondary | ICD-10-CM | POA: Diagnosis not present

## 2023-04-26 DIAGNOSIS — R0602 Shortness of breath: Secondary | ICD-10-CM | POA: Diagnosis not present

## 2023-04-26 DIAGNOSIS — R059 Cough, unspecified: Secondary | ICD-10-CM | POA: Diagnosis not present

## 2023-04-26 DIAGNOSIS — R55 Syncope and collapse: Secondary | ICD-10-CM | POA: Diagnosis not present

## 2023-04-26 DIAGNOSIS — R61 Generalized hyperhidrosis: Secondary | ICD-10-CM | POA: Diagnosis not present

## 2023-04-26 DIAGNOSIS — R509 Fever, unspecified: Secondary | ICD-10-CM | POA: Diagnosis not present

## 2023-04-26 MED ORDER — LEVOFLOXACIN 500 MG PO TABS
ORAL_TABLET | ORAL | 0 refills | Status: DC
Start: 2023-04-26 — End: 2023-05-24

## 2023-04-26 MED ORDER — ALBUTEROL SULFATE HFA 108 (90 BASE) MCG/ACT IN AERS: 2.0000 | INHALATION_SPRAY | Freq: Four times a day (QID) | RESPIRATORY_TRACT | 2 refills | Status: DC | PRN

## 2023-04-26 MED ORDER — BENZONATATE 100 MG PO CAPS: 100.0000 mg | ORAL_CAPSULE | Freq: Two times a day (BID) | ORAL | 0 refills | Status: DC | PRN

## 2023-04-26 NOTE — Patient Instructions (Addendum)
Go downstairs to 2nd floor for Chest Xray (Ortho)   Please increase clear hydration and use Tylenol for fever/aches  Use PLAIN mucinex (not DM) for congestion. Use coricidin Hbp for cough as well if needed.   If symptoms worsen, or the patient develop shortness of breath, pulse oximeter reading of < 90%, or chest pain, seek immediate care at nearest emergency department or call 911.    Non-Medication Therapy:  Drink plenty of fluids, warm if possible.   A teaspoon of honey may help ease coughing symptoms.   Cough drops or hard candy for coughing.   If you have high blood pressure, medication such as Coricidin HBP is safe to take for your cough and will not increase your blood pressure.

## 2023-04-26 NOTE — Progress Notes (Unsigned)
Acute Care Office Visit  Subjective:   Alicia Rodgers September 12, 1945 04/26/2023  Chief Complaint  Patient presents with   URI    Pt has had a cough and has been getting up clear phlegm. Stated that she has had a low grade fever. Pt said that it has been hard to get the phlegm up but did cough up some dark yellow phlegm today 9/9. Pt's daughter said that pt did pass out today.    HPI: URI SYMPTOMS: Onset: 1 day ago. States symptoms began 1 day ago with cough and low grade fever. She reports decreased appetite and did not eat well yesterday. She did have syncopal episode after starting to eat breakfast this morning that lasted a few seconds. EMS was called and they did get an EKG which showed Atrial Fibrillation. CBG was 136, BP 132/86 and SpO2 98%. Pt was advised to come to PCP for evaluation. Denies dizziness, chest pain, SHOB or palpitations at this time. Pt reports her heart rhythm is constantly in afibb, does not come in and out of afibb and previous EKGs confirm this per chart review.    Fever: Low grade fever last night (denies today)  Runny nose: No Nasal congestion: No  Sinus pressure: No Post nasal drip: no Cough: Yes, very congested and productive Ear pain: No Sore throat: No  Treatments tried: None  Recent sick contacts: Yes, she lives with granddaughter who recently had bacterial pneumonia and is finishing antibiotics. She was tested for flu and COVID and both were negative.    The following portions of the patient's history were reviewed and updated as appropriate: past medical history, past surgical history, family history, social history, allergies, medications, and problem list.   Patient Active Problem List   Diagnosis Date Noted   Thyroid nodule 03/25/2023   Pre-diabetes 12/23/2022   Longstanding persistent atrial fibrillation (HCC) 08/13/2022   Type 2 diabetes mellitus with other specified complication (HCC) 06/24/2021   Podagra 06/06/2021   CKD (chronic  kidney disease) 06/06/2021   Post-traumatic osteoarthritis, right ankle and foot 06/06/2021   Paresthesia 01/21/2016   PVC (premature ventricular contraction) 12/07/2011   Elevated lipids 03/29/2009   Essential hypertension 03/29/2009   CERVICAL CANCER, HX OF 03/29/2009   RHEUMATIC FEVER, HX OF 03/29/2009   Past Medical History:  Diagnosis Date   Anemia    Atrial fibrillation (HCC)    CERVICAL CANCER, HX OF    Fibrocystic breast    HYPERLIPIDEMIA    HYPERTENSION    Peripheral neuropathy    RHEUMATIC FEVER, HX OF    Past Surgical History:  Procedure Laterality Date   APPENDECTOMY     BREAST CYST EXCISION     BREAST EXCISIONAL BIOPSY Left    benign   CARDIOVERSION N/A 10/06/2018   Procedure: CARDIOVERSION;  Surgeon: Wendall Stade, MD;  Location: [facility];  Service: Cardiovascular;  Laterality: N/A;   CESAREAN SECTION     x3   TONSILLECTOMY     TOTAL ABDOMINAL HYSTERECTOMY     Family History  Problem Relation Age of Onset   Stroke Mother    Heart disease Father    Hypertension Brother    Hyperlipidemia Brother    Cancer Maternal Grandmother        Unsure   Heart disease Maternal Grandfather    Stroke Paternal Grandfather    Breast cancer Maternal Aunt    Breast cancer Paternal Aunt    Thyroid cancer Paternal Aunt  Outpatient Medications Prior to Visit  Medication Sig Dispense Refill   allopurinol (ZYLOPRIM) 100 MG tablet Take 1 tablet (100 mg total) by mouth daily. 90 tablet 3   apixaban (ELIQUIS) 5 MG TABS tablet Take 1 tablet (5 mg total) by mouth 2 (two) times daily. 180 tablet 3   atenolol (TENORMIN) 25 MG tablet TAKE 1 TABLET BY MOUTH TWICE DAILY. 180 tablet 3   colchicine 0.6 MG tablet Take 1 tablet (0.6 mg total) by mouth daily. Take for gout flare. 30 tablet 2   esomeprazole (NEXIUM) 20 MG capsule Take 20 mg by mouth daily as needed (heartburn).     hydrochlorothiazide (HYDRODIURIL) 12.5 MG tablet Take 1 tablet (12.5 mg total) by mouth daily. 90  tablet 3   Multiple Vitamins-Calcium (DAILY VITAMINS FOR WOMEN PO) Take 1 tablet by mouth daily.     pravastatin (PRAVACHOL) 10 MG tablet Take 1 tablet (10 mg total) by mouth daily. 90 tablet 3   valsartan (DIOVAN) 320 MG tablet Take 1 tablet (320 mg total) by mouth daily. 90 tablet 2   colchicine 0.6 MG tablet Take 0.6 mg by mouth as needed (gout).     Facility-Administered Medications Prior to Visit  Medication Dose Route Frequency Provider Last Rate Last Admin   dexamethasone (DECADRON) injection 4 mg  4 mg Intra-articular Once Vivi Barrack, DPM       Allergies  Allergen Reactions   Penicillins Swelling and Rash    Did it involve swelling of the face/tongue/throat, SOB, or low BP? No Did it involve sudden or severe rash/hives, skin peeling, or any reaction on the inside of your mouth or nose? No Did you need to seek medical attention at a hospital or doctor's office? No When did it last happen?      40 years ago If all above answers are "NO", may proceed with cephalosporin use.       ROS: A complete ROS was performed with pertinent positives/negatives noted in the HPI. The remainder of the ROS are negative.    Objective:   Today's Vitals   04/26/23 1547 04/26/23 1600  BP: (!) 156/76 (!) 142/82  Pulse: 66   Temp: 98.1 F (36.7 C)   TempSrc: Oral   SpO2: 98%   Weight: 140 lb (63.5 kg)   Height: 5\' 3"  (1.6 m)     GENERAL: Well-appearing, in NAD. Well nourished.  SKIN: Pink, warm and dry. No rash, lesion, ulceration, or ecchymoses.  Head: Normocephalic. NECK: Trachea midline. Full ROM w/o pain or tenderness. No lymphadenopathy.  EARS: Tympanic membranes are intact, translucent without bulging and without drainage. Appropriate landmarks visualized.  EYES: Conjunctiva clear without exudates. EOMI, PERRL, no drainage present.  NOSE: Septum midline w/o deformity. Nares patent, mucosa pink and non-inflamed w/o drainage. No sinus tenderness.  RESPIRATORY: Chest wall  symmetrical. Respirations even and non-labored. Breath sounds diminished bilaterally and rhonchi present to left lower lung base to auscultation. Cough is congested and productive.  CARDIAC: S1, S2 present, regular rate and rhythm without murmur or gallops. Peripheral pulses 2+ bilaterally.  MSK: Muscle tone and strength appropriate for age. Joints w/o tenderness, redness, or swelling.  EXTREMITIES: Without clubbing, cyanosis, or edema.  NEUROLOGIC: No motor or sensory deficits. Steady, even gait. C2-C12 intact.  PSYCH/MENTAL STATUS: Alert, oriented x 3. Cooperative, appropriate mood and affect.     EKG at 1621 on 04/26/2023: unchanged from previous tracings, atrial fibrillation, rate 61, nonspecific t wave abnormality.  Assessment & Plan:  1. Shortness  of breath EKG is unchanged from previous comparison. Will send patient for chest xray today for concern of bacterial pneumonia. Pending result, will send in antibiotic therapy. Will send in Tessalon perles for cough and albuterol to use sparingly as needed for wheezing or SHOB.   - EKG 12-Lead - DG Chest 2 View; Future   Meds ordered this encounter  Medications   benzonatate (TESSALON) 100 MG capsule    Sig: Take 1 capsule (100 mg total) by mouth 2 (two) times daily as needed for cough.    Dispense:  20 capsule    Refill:  0    Order Specific Question:   Supervising Provider    Answer:   DE [place], [name] [[id number]]   albuterol (VENTOLIN HFA) 108 (90 Base) MCG/ACT inhaler    Sig: Inhale 2 puffs into the lungs every 6 (six) hours as needed for wheezing or shortness of breath.    Dispense:  8 g    Refill:  2    Order Specific Question:   Supervising Provider    Answer:   DE [place], [name] [[id number]]   levofloxacin (LEVAQUIN) 500 MG tablet    Sig: Take 1 tablet by mouth every 2 days (48 hours) for 5 doses.    Dispense:  5 tablet    Refill:  0    Order Specific Question:   Supervising Provider    Answer:   DE [place], [name] [[id number]]     Return in about 4 weeks (around 05/24/2023) for Follow up Pneumonia Clearance .   Patient to reach out to office if new, worrisome, or unresolved symptoms arise or if no improvement in patient's condition. Patient verbalized understanding and is agreeable to treatment plan. All questions answered to patient's satisfaction.   Of note, portions of this note may have been created with voice recognition software Physicist, medical). While this note has been edited for accuracy, occasional wrong-word or 'sound-a-like' substitutions may have occurred due to the inherent limitations of voice recognition software.  Yolanda Manges, FNP

## 2023-04-27 NOTE — Progress Notes (Signed)
Relayed result of chest xray to patient. I did recommend changing her abx to doxycyline and she stated she had started levaquin Q48 hours and was doing well on it and felt better this morning. I believe she should continue on an antibiotic course given symptoms, physical exam findings and recent CAP exposure regardless of chest xray. She would prefer to stay on levaquin per patient request. I recommend she take probiotic daily and increase clear hydration. Stop if diarrhea occurs. She verbalized understanding.

## 2023-05-24 ENCOUNTER — Ambulatory Visit (HOSPITAL_BASED_OUTPATIENT_CLINIC_OR_DEPARTMENT_OTHER): Payer: Medicare Other | Admitting: Family Medicine

## 2023-05-24 ENCOUNTER — Encounter (HOSPITAL_BASED_OUTPATIENT_CLINIC_OR_DEPARTMENT_OTHER): Payer: Self-pay | Admitting: Family Medicine

## 2023-05-24 VITALS — BP 150/89 | HR 63 | Ht 63.0 in | Wt 138.9 lb

## 2023-05-24 DIAGNOSIS — Z23 Encounter for immunization: Secondary | ICD-10-CM

## 2023-05-24 DIAGNOSIS — Z09 Encounter for follow-up examination after completed treatment for conditions other than malignant neoplasm: Secondary | ICD-10-CM | POA: Diagnosis not present

## 2023-05-24 NOTE — Patient Instructions (Signed)
Please get your Pneumonia vaccine.

## 2023-05-24 NOTE — Progress Notes (Signed)
Subjective:   Alicia Rodgers 1946/06/16 05/24/2023  Chief Complaint  Patient presents with   pneumonia follow up    Patient is here following up after recent pneumonia. Patient states she is doing much better compared to last visit.    HPI: Patient is here for follow up of likely community acquired pneumonia diagnosed on 04/26/23. She completed Levaquin therapy and states she is feeling much better. She states she still has mild fatigue that is at baseline. She denies cough that is impairing sleep or causing SHOB. She reports she is to her baseline of activity and remains active. She is saturating 99% on room air today.    The following portions of the patient's history were reviewed and updated as appropriate: past medical history, past surgical history, family history, social history, allergies, medications, and problem list.   Patient Active Problem List   Diagnosis Date Noted   Thyroid nodule 03/25/2023   Pre-diabetes 12/23/2022   Longstanding persistent atrial fibrillation (HCC) 08/13/2022   Type 2 diabetes mellitus with other specified complication (HCC) 06/24/2021   Podagra 06/06/2021   CKD (chronic kidney disease) 06/06/2021   Post-traumatic osteoarthritis, right ankle and foot 06/06/2021   Paresthesia 01/21/2016   PVC (premature ventricular contraction) 12/07/2011   Elevated lipids 03/29/2009   Essential hypertension 03/29/2009   CERVICAL CANCER, HX OF 03/29/2009   RHEUMATIC FEVER, HX OF 03/29/2009   Past Medical History:  Diagnosis Date   Anemia    Atrial fibrillation (HCC)    CERVICAL CANCER, HX OF    Fibrocystic breast    HYPERLIPIDEMIA    HYPERTENSION    Peripheral neuropathy    RHEUMATIC FEVER, HX OF    Past Surgical History:  Procedure Laterality Date   APPENDECTOMY     BREAST CYST EXCISION     BREAST EXCISIONAL BIOPSY Left    benign   CARDIOVERSION N/A 10/06/2018   Procedure: CARDIOVERSION;  Surgeon: Wendall Stade, MD;  Location: [facility];   Service: Cardiovascular;  Laterality: N/A;   CESAREAN SECTION     x3   TONSILLECTOMY     TOTAL ABDOMINAL HYSTERECTOMY     Family History  Problem Relation Age of Onset   Stroke Mother    Heart disease Father    Hypertension Brother    Hyperlipidemia Brother    Cancer Maternal Grandmother        Unsure   Heart disease Maternal Grandfather    Stroke Paternal Grandfather    Breast cancer Maternal Aunt    Breast cancer Paternal Aunt    Thyroid cancer Paternal Aunt    Outpatient Medications Prior to Visit  Medication Sig Dispense Refill   albuterol (VENTOLIN HFA) 108 (90 Base) MCG/ACT inhaler Inhale 2 puffs into the lungs every 6 (six) hours as needed for wheezing or shortness of breath. 8 g 2   allopurinol (ZYLOPRIM) 100 MG tablet Take 1 tablet (100 mg total) by mouth daily. 90 tablet 3   apixaban (ELIQUIS) 5 MG TABS tablet Take 1 tablet (5 mg total) by mouth 2 (two) times daily. 180 tablet 3   atenolol (TENORMIN) 25 MG tablet TAKE 1 TABLET BY MOUTH TWICE DAILY. 180 tablet 3   benzonatate (TESSALON) 100 MG capsule Take 1 capsule (100 mg total) by mouth 2 (two) times daily as needed for cough. 20 capsule 0   colchicine 0.6 MG tablet Take 1 tablet (0.6 mg total) by mouth daily. Take for gout flare. 30 tablet 2   esomeprazole (  NEXIUM) 20 MG capsule Take 20 mg by mouth daily as needed (heartburn).     hydrochlorothiazide (HYDRODIURIL) 12.5 MG tablet Take 1 tablet (12.5 mg total) by mouth daily. 90 tablet 3   Multiple Vitamins-Calcium (DAILY VITAMINS FOR WOMEN PO) Take 1 tablet by mouth daily.     pravastatin (PRAVACHOL) 10 MG tablet Take 1 tablet (10 mg total) by mouth daily. 90 tablet 3   valsartan (DIOVAN) 320 MG tablet Take 1 tablet (320 mg total) by mouth daily. 90 tablet 2   levofloxacin (LEVAQUIN) 500 MG tablet Take 1 tablet by mouth every 2 days (48 hours) for 5 doses. 5 tablet 0   Facility-Administered Medications Prior to Visit  Medication Dose Route Frequency Provider Last Rate  Last Admin   dexamethasone (DECADRON) injection 4 mg  4 mg Intra-articular Once Vivi Barrack, DPM       Allergies  Allergen Reactions   Penicillins Swelling and Rash    Did it involve swelling of the face/tongue/throat, SOB, or low BP? No Did it involve sudden or severe rash/hives, skin peeling, or any reaction on the inside of your mouth or nose? No Did you need to seek medical attention at a hospital or doctor's office? No When did it last happen?      40 years ago If all above answers are "NO", may proceed with cephalosporin use.       ROS: A complete ROS was performed with pertinent positives/negatives noted in the HPI. The remainder of the ROS are negative.    Objective:   Today's Vitals   05/24/23 1028  BP: (!) 150/89  Pulse: 63  SpO2: 99%  Weight: 138 lb 14.4 oz (63 kg)  Height: 5\' 3"  (1.6 m)    Physical Exam          GENERAL: Well-appearing, in NAD. Well nourished.  SKIN: Pink, warm and dry. No rash, lesion, ulceration, or ecchymoses.  Head: Normocephalic. NECK: Trachea midline. Full ROM w/o pain or tenderness.  EYES: Conjunctiva clear without exudates. EOMI, PERRL, no drainage present.  RESPIRATORY: Chest wall symmetrical. Respirations even and non-labored. Breath sounds clear to auscultation bilaterally.  CARDIAC: S1, S2 present, irregular rate and rhythm at baseline without murmur or gallops. Peripheral pulses 2+ bilaterally.  MSK: Muscle tone and strength appropriate for age. Joints w/o tenderness, redness, or swelling.  EXTREMITIES: Without clubbing, cyanosis, or edema.  NEUROLOGIC: No motor or sensory deficits. Steady, even gait. C2-C12 intact.  PSYCH/MENTAL STATUS: Alert, oriented x 3. Cooperative, appropriate mood and affect.   Assessment & Plan:  1. Flu vaccine need Fluad given in office today.  - Flu Vaccine Trivalent High Dose (Fluad)  2. Follow-up exam Patient is doing well post CAP. No repeat imaging needed given previous chest xray  results. Discussed prevention with pneumonia vaccinations and patient will consider. She knows she can go to local pharmacy or return to PCP to receive.    Return if symptoms worsen or fail to improve.    Patient to reach out to office if new, worrisome, or unresolved symptoms arise or if no improvement in patient's condition. Patient verbalized understanding and is agreeable to treatment plan. All questions answered to patient's satisfaction.   Of note, portions of this note may have been created with voice recognition software Physicist, medical). While this note has been edited for accuracy, occasional wrong-word or 'sound-a-like' substitutions may have occurred due to the inherent limitations of voice recognition software.  Yolanda Manges, FNP

## 2023-06-23 DIAGNOSIS — E042 Nontoxic multinodular goiter: Secondary | ICD-10-CM | POA: Diagnosis not present

## 2023-06-29 ENCOUNTER — Ambulatory Visit: Payer: Medicare Other | Admitting: Cardiovascular Disease

## 2023-07-08 ENCOUNTER — Encounter (HOSPITAL_BASED_OUTPATIENT_CLINIC_OR_DEPARTMENT_OTHER): Payer: Self-pay | Admitting: Family Medicine

## 2023-07-09 ENCOUNTER — Other Ambulatory Visit: Payer: Self-pay | Admitting: Cardiovascular Disease

## 2023-07-09 DIAGNOSIS — I10 Essential (primary) hypertension: Secondary | ICD-10-CM

## 2023-07-20 ENCOUNTER — Ambulatory Visit (HOSPITAL_BASED_OUTPATIENT_CLINIC_OR_DEPARTMENT_OTHER): Payer: Medicare Other | Admitting: Family Medicine

## 2023-07-22 ENCOUNTER — Ambulatory Visit (HOSPITAL_BASED_OUTPATIENT_CLINIC_OR_DEPARTMENT_OTHER): Payer: Medicare Other | Admitting: Family Medicine

## 2023-07-22 ENCOUNTER — Encounter (HOSPITAL_BASED_OUTPATIENT_CLINIC_OR_DEPARTMENT_OTHER): Payer: Self-pay | Admitting: *Deleted

## 2023-07-22 ENCOUNTER — Encounter (HOSPITAL_BASED_OUTPATIENT_CLINIC_OR_DEPARTMENT_OTHER): Payer: Self-pay | Admitting: Family Medicine

## 2023-07-22 VITALS — BP 114/84 | HR 77 | Ht 62.0 in | Wt 138.2 lb

## 2023-07-22 DIAGNOSIS — M79671 Pain in right foot: Secondary | ICD-10-CM | POA: Diagnosis not present

## 2023-07-22 DIAGNOSIS — I10 Essential (primary) hypertension: Secondary | ICD-10-CM

## 2023-07-22 DIAGNOSIS — M79672 Pain in left foot: Secondary | ICD-10-CM

## 2023-07-22 DIAGNOSIS — R7303 Prediabetes: Secondary | ICD-10-CM | POA: Diagnosis not present

## 2023-07-22 DIAGNOSIS — E041 Nontoxic single thyroid nodule: Secondary | ICD-10-CM

## 2023-07-22 DIAGNOSIS — M79673 Pain in unspecified foot: Secondary | ICD-10-CM | POA: Insufficient documentation

## 2023-07-22 NOTE — Progress Notes (Unsigned)
    Procedures performed today:    None.  Independent interpretation of notes and tests performed by another provider:   None.  Brief History, Exam, Impression, and Recommendations:    BP 114/84 (BP Location: Right Arm, Patient Position: Sitting, Cuff Size: Normal)   Pulse 77   Ht 5\' 2"  (1.575 m)   Wt 138 lb 3.2 oz (62.7 kg)   SpO2 96%   BMI 25.28 kg/m   There are no diagnoses linked to this encounter.No follow-ups on file.   ___________________________________________ Keyarah Mcroy de [place], MD, ABFM, [facility]

## 2023-07-23 LAB — TSH: TSH: 1.08 u[IU]/mL (ref 0.450–4.500)

## 2023-07-23 LAB — HEMOGLOBIN A1C
Est. average glucose Bld gHb Est-mCnc: 131 mg/dL
Hgb A1c MFr Bld: 6.2 % — ABNORMAL HIGH (ref 4.8–5.6)

## 2023-08-17 NOTE — Progress Notes (Signed)
 Cardiology Office Note   Date:  08/30/2023   ID:  Alicia Rodgers, Alicia Rodgers Dec 02, 1945, MRN [id number]  PCP:  [place], Alicia J, MD  Cardiologist:  Alicia Rodgers     No chief complaint on file.      History of Present Illness: Alicia Rodgers is a 77 y.o. female who presents for atrial fib. Diagnosed on office visit 08/25/18 TTE normal EF LA 44 mm  [facility] 10/06/18 with conversion but ERAF Decision made to rate control CHADVAS 3 on Eliquis  and beta blocker   Eliquis  is expensive until she reaches her deductible   No cardiac issues History of gout in right foot Rx colchicine    Seen in ED 04/07/22 with headache and elevated BP.  CT/MRI head negative Noted 2 cm tyroid nodule being w/u now US  FNA BX done 11/15  BP improved on current meds My reading today 130/80 mmHg  TTE 07/29/22 reviewed EF 60-65% mild MR severe bi atrial enlargement   Seems to be developing mild DM and neuropathy in feet  04/26/23 Rx for community acquired pneumonia with Levaquin  CXR did not show any infiltrate    Past Medical History:  Diagnosis Date   Anemia    Atrial fibrillation (HCC)    CERVICAL CANCER, HX OF    Fibrocystic breast    HYPERLIPIDEMIA    HYPERTENSION    Peripheral neuropathy    RHEUMATIC FEVER, HX OF     Past Surgical History:  Procedure Laterality Date   APPENDECTOMY     BREAST CYST EXCISION     BREAST EXCISIONAL BIOPSY Left    benign   CARDIOVERSION N/A 10/06/2018   Procedure: CARDIOVERSION;  Surgeon: Alicia Rodgers Alicia BROCKS, MD;  Location: [facility];  Service: Cardiovascular;  Laterality: N/A;   CESAREAN SECTION     x3   TONSILLECTOMY     TOTAL ABDOMINAL HYSTERECTOMY       Current Outpatient Medications  Medication Sig Dispense Refill   allopurinol  (ZYLOPRIM ) 100 MG tablet Take 1 tablet (100 mg total) by mouth daily. 90 tablet 3   atenolol  (TENORMIN ) 25 MG tablet TAKE ONE TABLET BY MOUTH TWICE DAILY 180 tablet 1   ELIQUIS  5 MG TABS tablet TAKE 1 TABLET TWICE DAILY 180 tablet 3   esomeprazole  (NEXIUM) 20 MG capsule Take 20 mg by mouth daily as needed (heartburn).     hydrochlorothiazide  (HYDRODIURIL ) 12.5 MG tablet Take 1 tablet (12.5 mg total) by mouth daily. 90 tablet 3   Multiple Vitamins-Calcium (DAILY VITAMINS FOR WOMEN PO) Take 1 tablet by mouth daily.     pravastatin  (PRAVACHOL ) 10 MG tablet Take 1 tablet (10 mg total) by mouth daily. 90 tablet 3   valsartan  (DIOVAN ) 320 MG tablet Take 1 tablet (320 mg total) by mouth daily. 90 tablet 2   Current Facility-Administered Medications  Medication Dose Route Frequency Provider Last Rate Last Admin   dexamethasone  (DECADRON ) injection 4 mg  4 mg Intra-articular Once Alicia Rodgers, Alicia Rodgers, DPM        Allergies:   Penicillins    Social History:  The patient  reports that she has never smoked. She has never been exposed to tobacco smoke. She has never used smokeless tobacco. She reports that she does not drink alcohol and does not use drugs.   Family History:  The patient's family history includes Breast cancer in her maternal aunt and paternal aunt; Cancer in her maternal grandmother; Heart disease in her father and maternal grandfather; Hyperlipidemia in her brother;  Hypertension in her brother; Stroke in her mother and paternal grandfather; Thyroid  cancer in her paternal aunt.    ROS:  General:no colds or fevers currently, no weight changes Skin:no rashes or ulcers HEENT:one episode of blurred vision with rapid HR, no congestion CV:see HPI PUL:see HPI GI:no diarrhea constipation or melena, no indigestion GU:no hematuria, no dysuria MS:no joint pain, no claudication Neuro:no syncope, no lightheadedness Endo:no diabetes, no thyroid  disease  Wt Readings from Last 3 Encounters:  08/30/23 138 lb 9.6 oz (62.9 kg)  07/22/23 138 lb 3.2 oz (62.7 kg)  05/24/23 138 lb 14.4 oz (63 kg)     PHYSICAL EXAM: VS:  BP 132/74   Pulse 70   Ht 5' 2 (1.575 m)   Wt 138 lb 9.6 oz (62.9 kg)   SpO2 96%   BMI 25.35 kg/m  , BMI Body mass  index is 25.35 kg/m.  Affect appropriate Healthy:  appears stated age HEENT: palpable right thyroid  nodule  Neck supple with no adenopathy JVP normal no bruits no thyromegaly Lungs clear with no wheezing and good diaphragmatic motion Heart:  S1/S2 1/6 SEM murmur, no rub, gallop or click PMI normal Abdomen: benighn, BS positve, no tenderness, no AAA no bruit.  No HSM or HJR Distal pulses intact with no bruits No edema Neuro non-focal Skin warm and dry No muscular weakness  EKG:    Atrial flutter rate 67 nonspecific ST changes 08/30/2023 afib rate 66 PVC no acute changes   Recent Labs: 12/23/2022: ALT 25; BUN 22; Creatinine, Ser 1.13; Potassium 4.0; Sodium 141 07/22/2023: TSH 1.080    Lipid Panel    Component Value Date/Time   CHOL 183 12/30/2021 0900   TRIG 97 12/30/2021 0900   HDL 54 12/30/2021 0900   CHOLHDL 3.4 12/30/2021 0900   LDLCALC 111 (H) 12/30/2021 0900     Other studies Reviewed: Additional studies/ records that were reviewed today include: . Echo 08/2018 Study Conclusions   - Left ventricle: The cavity size was normal. Systolic function was   normal. The estimated ejection fraction was in the range of 60%   to 65%. Wall motion was normal; there were no regional wall   motion abnormalities. - Mitral valve: Valve area by continuity equation (using LVOT   flow): 2.52 cm^2. - Left atrium: The atrium was severely dilated. - Right atrium: The atrium was mildly dilated. - Pulmonary arteries: Systolic pressure was within the normal   range. PA peak pressure: 34 mm Hg (S).    ASSESSMENT AND PLAN:  1.  Afib: permanent rate control and anticoagulation good Severe LAE failed [facility] Labs done by primary  Alicia Rodgers q 6 months  EF preserved by TTE 07/29/22   2. Hx of COVID CXR 08/21/19 NAD Received vaccine April 2021      3.  Murmur with clindamycin for dental procedures with hx of rheumatic fever. Echo done 07/29/22 with just mild MR AV sclerosis and trivial AR    4  HTN : Well controlled.  Continue current medications and low sodium Dash type diet.    5. HLD:  On statin labs with primary Discussed utility of calcium score  6. Gout:  post rx steroid injection and colchicine  has f/u with rheumatology on allopurinol  and adhering to low purine diet Sees DR Lonni Ester Rheumatology   7. Thyroid :  2 cm right nodule for biopsy f/u endocrine/primary Benign follicular nodule  8. Neuropathy:  peripheral in feet f/u primary for labs ? Start Gabapentin   9. DM-2  diet control for now A1c 6.2     Disposition:   FU: 6 months   Signed, Alicia Emmer, MD  08/30/2023 8:49 AM    [facility] [address] [place], [place], [place]  [address]/ [address] [place], [place] Phone: [phone]; Fax: [phone]  [phone]

## 2023-08-27 ENCOUNTER — Other Ambulatory Visit: Payer: Self-pay | Admitting: Cardiovascular Disease

## 2023-08-27 DIAGNOSIS — I482 Chronic atrial fibrillation, unspecified: Secondary | ICD-10-CM

## 2023-08-27 NOTE — Telephone Encounter (Signed)
 Prescription refill request for Eliquis received. Indication:AFIB Last office visit:5/24 Scr:1.13  5/24 Age: 78 Weight:62.7  kg  Prescription refilled

## 2023-08-30 ENCOUNTER — Encounter: Payer: Self-pay | Admitting: Cardiovascular Disease

## 2023-08-30 ENCOUNTER — Ambulatory Visit: Payer: Medicare Other | Attending: Cardiovascular Disease | Admitting: Cardiovascular Disease

## 2023-08-30 VITALS — BP 132/74 | HR 70 | Ht 62.0 in | Wt 138.6 lb

## 2023-08-30 DIAGNOSIS — E782 Mixed hyperlipidemia: Secondary | ICD-10-CM | POA: Insufficient documentation

## 2023-08-30 DIAGNOSIS — I10 Essential (primary) hypertension: Secondary | ICD-10-CM | POA: Diagnosis not present

## 2023-08-30 DIAGNOSIS — I4821 Permanent atrial fibrillation: Secondary | ICD-10-CM | POA: Insufficient documentation

## 2023-08-30 DIAGNOSIS — D6859 Other primary thrombophilia: Secondary | ICD-10-CM | POA: Diagnosis not present

## 2023-08-30 NOTE — Patient Instructions (Addendum)
Medication Instructions:  Your physician recommends that you continue on your current medications as directed. Please refer to the Current Medication list given to you today.  *If you need a refill on your cardiac medications before your next appointment, please call your pharmacy*  Lab Work: If you have labs (blood work) drawn today and your tests are completely normal, you will receive your results only by: [place] (if you have MyChart) OR A paper copy in the mail If you have any lab test that is abnormal or we need to change your treatment, we will call you to review the results.  Testing/Procedures: None ordered today.  Follow-Up: At [facility], you and your health needs are our priority.  As part of our continuing mission to provide you with exceptional heart care, we have created designated Provider Care Teams.  These Care Teams include your primary Cardiologist (physician) and Advanced Practice Providers (APPs -  Physician Assistants and Nurse Practitioners) who all work together to provide you with the care you need, when you need it.  We recommend signing up for the patient portal called "MyChart".  Sign up information is provided on this After Visit Summary.  MyChart is used to connect with patients for Virtual Visits (Telemedicine).  Patients are able to view lab/test results, encounter notes, upcoming appointments, etc.  Non-urgent messages can be sent to your provider as well.   To learn more about what you can do with MyChart, go to [email or web address].    Your next appointment:   12 month(s)  Provider:   [name], MD

## 2023-09-01 NOTE — Assessment & Plan Note (Signed)
 Patient continues with atenolol , hydrochlorothiazide , valsartan .  She continues to follow with cardiology as well.  She does occasionally check her blood pressure at home, however typically she feels that she will know when her blood pressure is running high and will check it at that time.  No current issues with chest pain, headaches, lightheadedness or dizziness. On exam, patient in no acute distress, cardiovascular exam with irregularly irregular rhythm, normal rate.  Lungs clear to auscultation bilaterally. Blood pressure borderline controlled in office, particularly related to diastolic reading.  At this time, can continue with current medication regimen, no changes today Recommend continuing with follow-up with cardiology as scheduled.  Recommend intermittent monitoring of blood pressure at home, DASH diet

## 2023-09-01 NOTE — Assessment & Plan Note (Signed)
Patient has had evaluation with Dr. Talmage Nap related to multinodular goiter/thyroid nodule.  She did have thyroid nodule biopsy completed and reports that this was reassuring.  She indicates that she will have follow-up in 1 year for monitoring.  She does not have any concerns today, cannot any new symptoms

## 2023-09-01 NOTE — Assessment & Plan Note (Signed)
 Patient reports that she has been having fluctuating symptoms of feeling as though her feet are very cold to feeling that her feet are burning and on fire.  She has not noticed factors associated with these symptoms and symptoms will come and go sporadically.  Discussed possibility of underlying neuropathy as cause of symptoms.  She does not have any current numbness or tingling we can proceed with laboratory assessment related to A1c today.  Can also check thyroid  function as below.  Could consider additional evaluation with EMG for further testing

## 2023-09-01 NOTE — Assessment & Plan Note (Signed)
 Uncertain history of prediabetes versus diabetes mellitus. A1c available for review in chart from labs that we have done have been within prediabetes range.  Patient denies ever being told about diagnosis of diabetes.  On reviewing records through Care Everywhere, can see billing including diagnosis of type 2 diabetes, however cannot see documentation.  We will need to request records to review prior labs clarify diagnosis At this time, patient continues with lifestyle modifications, no medications utilized No current issues with polyuria or polydipsia For now we can continue with lifestyle modifications.  We can proceed with check of hemoglobin A1c today for monitoring

## 2023-09-29 NOTE — Progress Notes (Deleted)
 Office Visit Note  Patient: Alicia Rodgers             Date of Birth: Jun 19, 1946           MRN: [id number]             PCP: de Peru, [name], MD Referring: de [place], [name], MD Visit Date: 10/13/2023   Subjective:  No chief complaint on file.   History of Present Illness: Alicia Rodgers is a 78 y.o. female here for follow up for gout on allopurinol 100 mg daily.    Previous HPI 10/13/2022 Alicia Rodgers is a 78 y.o. female here for follow up for gout on allopurinol 100 mg daily.  She has no new flareups for the past year and has not needed to take any colchicine.  She does not report much trouble with other arthritis either.  She has had some follow-up with her cardiology currently takes atenolol 25 mg twice daily and Eliquis 5 mg twice daily for permanent A-fib.  She takes valsartan 320 mg daily for hypertension and reports having normal blood pressure values at home and consistently runs mildly high at medical office visits.  When other symptoms she has noticed is frequently getting either cold or hot sensation in her toes frequently associated with red discoloration in the feet at the same times.   Previous HPI 10/14/21 Alicia Rodgers is a 78 y.o. female here for follow up for gout on allopurinol 100 mg daily.  She has no new flare up of gout since our last visit in November and no trouble taking the allopurinol. She remains very adherent to a low purine diet her daughter is very health conscious and helps keep track of this such as rarely eating sweets.   Previous HPI 07/15/21 Alicia Rodgers is a 78 y.o. female here for follow up for gout 1 month follow up after starting allopurinol 100 mg PO daily. Uric acid was 7.7 and slow dose titration started due to CKD. She did not notice any trouble starting the allopurinol. She suffered a flare up of pain and swelling in the left foot starting in the forefoot eventually extending to the whole foot below the ankle within 2-3 days. She saw the ED and  was prescribed mitigare and took as directed total of 3 tablets after an hour and experienced 12 hours of severe watery diarrhea afterwards but foot symptoms have improved. She still has mild swelling left, and feels partial numbness sensation in the forefoot.    Previous HPI 06/06/21 Alicia Rodgers is a 78 y.o. female here for gouty arthritis of the right foot with recurrent episodes treated with local steroid injection and most recent with daily colchicine. This started with the first time about 2 years ago and now 4 total episodes. This year was worse with one episode in June and another in September, with the last time taking a month to resolve. She has had local steroid injections to the 1st MTP joint usually with good relief but the last time slower to improve. Xrays from earlier this year demonstrated osteoarthritis change in the midfoot, with past traumatic injury to the site, there was no erosive disease at the MTPs. She is on eliquis anticoagulation for permanent Afib. She previously took dyazide but not on any current diuretic treatments.   No Rheumatology ROS completed.   PMFS History:  Patient Active Problem List   Diagnosis Date Noted   Foot pain 07/22/2023  Thyroid nodule 03/25/2023   Pre-diabetes 12/23/2022   Longstanding persistent atrial fibrillation (HCC) 08/13/2022   Type 2 diabetes mellitus with other specified complication (HCC) 06/24/2021   Podagra 06/06/2021   CKD (chronic kidney disease) 06/06/2021   Post-traumatic osteoarthritis, right ankle and foot 06/06/2021   Paresthesia 01/21/2016   PVC (premature ventricular contraction) 12/07/2011   Elevated lipids 03/29/2009   Essential hypertension 03/29/2009   CERVICAL CANCER, HX OF 03/29/2009   RHEUMATIC FEVER, HX OF 03/29/2009    Past Medical History:  Diagnosis Date   Anemia    Atrial fibrillation (HCC)    CERVICAL CANCER, HX OF    Fibrocystic breast    HYPERLIPIDEMIA    HYPERTENSION    Peripheral neuropathy     RHEUMATIC FEVER, HX OF     Family History  Problem Relation Age of Onset   Stroke Mother    Heart disease Father    Hypertension Brother    Hyperlipidemia Brother    Cancer Maternal Grandmother        Unsure   Heart disease Maternal Grandfather    Stroke Paternal Grandfather    Breast cancer Maternal Aunt    Breast cancer Paternal Aunt    Thyroid cancer Paternal Aunt    Past Surgical History:  Procedure Laterality Date   APPENDECTOMY     BREAST CYST EXCISION     BREAST EXCISIONAL BIOPSY Left    benign   CARDIOVERSION N/A 10/06/2018   Procedure: CARDIOVERSION;  Surgeon: Wendall Stade, MD;  Location: [facility];  Service: Cardiovascular;  Laterality: N/A;   CESAREAN SECTION     x3   TONSILLECTOMY     TOTAL ABDOMINAL HYSTERECTOMY     Social History   Social History Narrative   Lives with her daughter and her family.   Right-handed.   1 cup caffeine daily.   Immunization History  Administered Date(s) Administered   Fluad Trivalent(High Dose 65+) 05/24/2023   Influenza, Quadrivalent, Recombinant, Inj, Pf 06/23/2022   Influenza-Unspecified 05/26/2018   PFIZER(Purple Top)SARS-COV-2 Vaccination 10/27/2019, 11/22/2019     Objective: Vital Signs: There were no vitals taken for this visit.   Physical Exam   Musculoskeletal Exam: ***  CDAI Exam: CDAI Score: -- Patient Global: --; Provider Global: -- Swollen: --; Tender: -- Joint Exam 10/13/2023   No joint exam has been documented for this visit   There is currently no information documented on the homunculus. Go to the Rheumatology activity and complete the homunculus joint exam.  Investigation: No additional findings.  Imaging: No results found.  Recent Labs: Lab Results  Component Value Date   WBC 6.6 04/07/2022   HGB 14.1 04/07/2022   PLT 205 04/07/2022   NA 141 12/23/2022   K 4.0 12/23/2022   CL 103 12/23/2022   CO2 21 12/23/2022   GLUCOSE 111 (H) 12/23/2022   BUN 22 12/23/2022   CREATININE  1.13 (H) 12/23/2022   BILITOT 0.7 12/23/2022   ALKPHOS 77 12/23/2022   AST 22 12/23/2022   ALT 25 12/23/2022   PROT 6.8 12/23/2022   ALBUMIN 4.3 12/23/2022   CALCIUM 10.0 12/23/2022   GFRAA 55 (L) 08/21/2019    Speciality Comments: No specialty comments available.  Procedures:  No procedures performed Allergies: Penicillins   Assessment / Plan:     Visit Diagnoses: No diagnosis found.  ***  Orders: No orders of the defined types were placed in this encounter.  No orders of the defined types were placed in this encounter.  Follow-Up Instructions: No follow-ups on file.   Metta Clines, RT  Note - This record has been created using [employer].  Chart creation errors have been sought, but may not always  have been located. Such creation errors do not reflect on  the standard of medical care.

## 2023-10-05 ENCOUNTER — Encounter (HOSPITAL_BASED_OUTPATIENT_CLINIC_OR_DEPARTMENT_OTHER): Payer: Medicare Other

## 2023-10-12 ENCOUNTER — Encounter (HOSPITAL_BASED_OUTPATIENT_CLINIC_OR_DEPARTMENT_OTHER): Payer: Self-pay

## 2023-10-12 ENCOUNTER — Ambulatory Visit (HOSPITAL_BASED_OUTPATIENT_CLINIC_OR_DEPARTMENT_OTHER): Payer: Medicare Other | Admitting: *Deleted

## 2023-10-12 DIAGNOSIS — Z0000 Encounter for general adult medical examination without abnormal findings: Secondary | ICD-10-CM

## 2023-10-12 NOTE — Progress Notes (Signed)
 Subjective:   Alicia Rodgers is a 78 y.o. female who presents for Medicare Annual (Subsequent) preventive examination.  Visit Complete: Virtual I connected with  Alicia Rodgers on 10/12/23 by a audio enabled telemedicine application and verified that I am speaking with the correct person using two identifiers.  Patient Location: Home  Provider Location: Office/Clinic  I discussed the limitations of evaluation and management by telemedicine. The patient expressed understanding and agreed to proceed.  Vital Signs: Because this visit was a virtual/telehealth visit, some criteria may be missing or patient reported. Any vitals not documented were not able to be obtained and vitals that have been documented are patient reported.  Patient Medicare AWV questionnaire was completed by the patient on 10/12/23; I have confirmed that all information answered by patient is correct and no changes since this date.        Objective:    There were no vitals filed for this visit. There is no height or weight on file to calculate BMI.     09/29/2022    1:31 PM 10/06/2018   11:25 AM  Advanced Directives  Does Patient Have a Medical Advance Directive? No Yes  Type of Special educational needs teacher of [place];Living will  Copy of Healthcare Power of Attorney in Chart?  No - copy requested  Would patient like information on creating a medical advance directive? No - Patient declined     Current Medications (verified) Outpatient Encounter Medications as of 10/12/2023  Medication Sig   allopurinol (ZYLOPRIM) 100 MG tablet Take 1 tablet (100 mg total) by mouth daily.   atenolol (TENORMIN) 25 MG tablet TAKE ONE TABLET BY MOUTH TWICE DAILY   ELIQUIS 5 MG TABS tablet TAKE 1 TABLET TWICE DAILY   esomeprazole (NEXIUM) 20 MG capsule Take 20 mg by mouth daily as needed (heartburn).   hydrochlorothiazide (HYDRODIURIL) 12.5 MG tablet Take 1 tablet (12.5 mg total) by mouth daily.   Multiple Vitamins-Calcium  (DAILY VITAMINS FOR WOMEN PO) Take 1 tablet by mouth daily.   pravastatin (PRAVACHOL) 10 MG tablet Take 1 tablet (10 mg total) by mouth daily.   valsartan (DIOVAN) 320 MG tablet Take 1 tablet (320 mg total) by mouth daily.   Facility-Administered Encounter Medications as of 10/12/2023  Medication   dexamethasone (DECADRON) injection 4 mg    Allergies (verified) Penicillins   History: Past Medical History:  Diagnosis Date   Anemia    Atrial fibrillation (HCC)    CERVICAL CANCER, HX OF    Fibrocystic breast    HYPERLIPIDEMIA    HYPERTENSION    Peripheral neuropathy    RHEUMATIC FEVER, HX OF    Past Surgical History:  Procedure Laterality Date   APPENDECTOMY     BREAST CYST EXCISION     BREAST EXCISIONAL BIOPSY Left    benign   CARDIOVERSION N/A 10/06/2018   Procedure: CARDIOVERSION;  Surgeon: Wendall Stade, MD;  Location: [facility];  Service: Cardiovascular;  Laterality: N/A;   CESAREAN SECTION     x3   TONSILLECTOMY     TOTAL ABDOMINAL HYSTERECTOMY     Family History  Problem Relation Age of Onset   Stroke Mother    Heart disease Father    Hypertension Brother    Hyperlipidemia Brother    Cancer Maternal Grandmother        Unsure   Heart disease Maternal Grandfather    Stroke Paternal Grandfather    Breast cancer Maternal Aunt    Breast cancer  Paternal Aunt    Thyroid cancer Paternal Aunt    Social History   Socioeconomic History   Marital status: Widowed    Spouse name: Not on file   Number of children: 3   Years of education: 1 yr coll   Highest education level: Not on file  Occupational History   Occupation: Airline pilot    Comment: Retired  Tobacco Use   Smoking status: Never    Passive exposure: Never   Smokeless tobacco: Never  Vaping Use   Vaping status: Never Used  Substance and Sexual Activity   Alcohol use: No   Drug use: No   Sexual activity: Not on file  Other Topics Concern   Not on file  Social History Narrative   Lives with her  daughter and her family.   Right-handed.   1 cup caffeine daily.   Social Drivers of Corporate investment banker Strain: Low Risk  (09/29/2022)   Overall Financial Resource Strain (CARDIA)    Difficulty of Paying Living Expenses: Not hard at all  Food Insecurity: No Food Insecurity (09/29/2022)   Hunger Vital Sign    Worried About Running Out of Food in the Last Year: Never true    Ran Out of Food in the Last Year: Never true  Transportation Needs: No Transportation Needs (09/29/2022)   PRAPARE - Administrator, Civil Service (Medical): No    Lack of Transportation (Non-Medical): No  Physical Activity: Insufficiently Active (09/29/2022)   Exercise Vital Sign    Days of Exercise per Week: 5 days    Minutes of Exercise per Session: 20 min  Stress: No Stress Concern Present (09/29/2022)   [employer] of Occupational Health - Occupational Stress Questionnaire    Feeling of Stress : Not at all  Social Connections: Moderately Integrated (09/29/2022)   Social Connection and Isolation Panel [NHANES]    Frequency of Communication with Friends and Family: More than three times a week    Frequency of Social Gatherings with Friends and Family: More than three times a week    Attends Religious Services: More than 4 times per year    Active Member of [employer] or Organizations: Yes    Attends Banker Meetings: More than 4 times per year    Marital Status: Widowed    Tobacco Counseling Counseling given: Not Answered   Clinical Intake:                        Activities of Daily Living     No data to display           Patient Care Team: de [place], Buren Kos, MD as PCP - General (Family Medicine) Wendall Stade, MD as PCP - Cardiology (Cardiology)  Indicate any recent Medical Services you may have received from other than [facility] providers in the past year (date may be approximate).     Assessment:   This is a routine wellness examination for  Alicia Rodgers.  Hearing/Vision screen No results found.   Goals Addressed   None   Depression Screen    07/22/2023    9:28 AM 05/24/2023   10:31 AM 04/26/2023    3:51 PM 03/25/2023    8:44 AM 09/29/2022    1:28 PM  PHQ 2/9 Scores  PHQ - 2 Score 0 0 0 0 0  PHQ- 9 Score 0 1 0 0   Exception Documentation    Medical reason  Fall Risk    07/22/2023    9:28 AM 04/26/2023    3:51 PM 03/25/2023    8:44 AM 09/29/2022    1:30 PM 06/23/2022    9:18 AM  Fall Risk   Falls in the past year? 0 1 0 0 0  Number falls in past yr: 0 0 0 0 0  Injury with Fall? 0 0 0 0 0  Risk for fall due to : No Fall Risks No Fall Risks No Fall Risks No Fall Risks No Fall Risks  Follow up Falls evaluation completed Falls evaluation completed Falls evaluation completed Falls prevention discussed Falls evaluation completed    MEDICARE RISK AT HOME:    TIMED UP AND GO:  Was the test performed?  No    Cognitive Function:        09/29/2022    1:31 PM  6CIT Screen  What Year? 0 points  What month? 0 points  What time? 0 points  Count back from 20 0 points  Months in reverse 0 points  Repeat phrase 0 points  Total Score 0 points    Immunizations Immunization History  Administered Date(s) Administered   Fluad Trivalent(High Dose 65+) 05/24/2023   Influenza, Quadrivalent, Recombinant, Inj, Pf 06/23/2022   Influenza-Unspecified 05/26/2018   PFIZER(Purple Top)SARS-COV-2 Vaccination 10/27/2019, 11/22/2019    TDAP status: Up to date  Flu Vaccine status: Up to date  Pneumococcal vaccine status: Declined,  Education has been provided regarding the importance of this vaccine but patient still declined. Advised may receive this vaccine at local pharmacy or Health Dept. Aware to provide a copy of the vaccination record if obtained from local pharmacy or Health Dept. Verbalized acceptance and understanding.   Covid-19 vaccine status: Completed vaccines  Qualifies for Shingles Vaccine? Yes   Zostavax completed No    Shingrix Completed?: No.    Education has been provided regarding the importance of this vaccine. Patient has been advised to call insurance company to determine out of pocket expense if they have not yet received this vaccine. Advised may also receive vaccine at local pharmacy or Health Dept. Verbalized acceptance and understanding.  Screening Tests Health Maintenance  Topic Date Due   Pneumonia Vaccine 88+ Years old (1 of 2 - PCV) Never done   FOOT EXAM  Never done   Hepatitis C Screening  Never done   DTaP/Tdap/Td (1 - Tdap) Never done   Zoster Vaccines- Shingrix (1 of 2) Never done   COVID-19 Vaccine (3 - 2024-25 season) 04/18/2023   OPHTHALMOLOGY EXAM  10/02/2023   Diabetic kidney evaluation - eGFR measurement  12/23/2023   Diabetic kidney evaluation - Urine ACR  12/23/2023   HEMOGLOBIN A1C  01/20/2024   Medicare Annual Wellness (AWV)  10/11/2024   INFLUENZA VACCINE  Completed   DEXA SCAN  Completed   HPV VACCINES  Aged Out   Colonoscopy  Discontinued    Health Maintenance  Health Maintenance Due  Topic Date Due   Pneumonia Vaccine 18+ Years old (1 of 2 - PCV) Never done   FOOT EXAM  Never done   Hepatitis C Screening  Never done   DTaP/Tdap/Td (1 - Tdap) Never done   Zoster Vaccines- Shingrix (1 of 2) Never done   COVID-19 Vaccine (3 - 2024-25 season) 04/18/2023   OPHTHALMOLOGY EXAM  10/02/2023    Colorectal cancer screening: No longer required.   Mammogram status: No longer required due to age.  Lung Cancer Screening: (Low Dose CT Chest recommended if  Age 32-80 years, 20 pack-year currently smoking OR have quit w/in 15years.) does not qualify.   Lung Cancer Screening Referral: NA  Additional Screening:  Hepatitis C Screening: does qualify; Pt declines at this time   Vision Screening: Recommended annual ophthalmology exams for early detection of glaucoma and other disorders of the eye. Is the patient up to date with their annual eye exam?  Yes  Who is the  provider or what is the name of the office in which the patient attends annual eye exams? Dr Darel Hong  If pt is not established with a provider, would they like to be referred to a provider to establish care? No .   Dental Screening: Recommended annual dental exams for proper oral hygiene   Community Resource Referral / Chronic Care Management: CRR required this visit?  No   CCM required this visit?  No     Plan:     I have personally reviewed and noted the following in the patient's chart:   Medical and social history Use of alcohol, tobacco or illicit drugs  Current medications and supplements including opioid prescriptions. Patient is not currently taking opioid prescriptions. Functional ability and status Nutritional status Physical activity Advanced directives List of other physicians Hospitalizations, surgeries, and ER visits in previous 12 months Vitals Screenings to include cognitive, depression, and falls Referrals and appointments  In addition, I have reviewed and discussed with patient certain preventive protocols, quality metrics, and best practice recommendations. A written personalized care plan for preventive services as well as general preventive health recommendations were provided to patient.     Park Breed, CMA   10/12/2023   After Visit Summary: (MyChart) Due to this being a telephonic visit, the after visit summary with patients personalized plan was offered to patient via MyChart   Nurse Notes:  Ms. Brick , Thank you for taking time to come for your Medicare Wellness Visit. I appreciate your ongoing commitment to your health goals. Please review the following plan we discussed and let me know if I can assist you in the future.   These are the goals we discussed:  Goals       No Current goals (pt-stated)      Patient Stated      Patient would like to just keep going along travelling         This is a list of the screening recommended for you  and due dates:  Health Maintenance  Topic Date Due   Pneumonia Vaccine (1 of 2 - PCV) Never done   Complete foot exam   Never done   Hepatitis C Screening  Never done   DTaP/Tdap/Td vaccine (1 - Tdap) Never done   Zoster (Shingles) Vaccine (1 of 2) Never done   COVID-19 Vaccine (3 - 2024-25 season) 04/18/2023   Eye exam for diabetics  10/02/2023   Yearly kidney function blood test for diabetes  12/23/2023   Yearly kidney health urinalysis for diabetes  12/23/2023   Hemoglobin A1C  01/20/2024   Medicare Annual Wellness Visit  10/11/2024   Flu Shot  Completed   DEXA scan (bone density measurement)  Completed   HPV Vaccine  Aged Out   Colon Cancer Screening  Discontinued

## 2023-10-12 NOTE — Patient Instructions (Signed)
 Alicia Rodgers , Thank you for taking time to come for your Medicare Wellness Visit. I appreciate your ongoing commitment to your health goals. Please review the following plan we discussed and let me know if I can assist you in the future.   Screening recommendations/referrals:  Recommended yearly ophthalmology/optometry visit for glaucoma screening and checkup Recommended yearly dental visit for hygiene and checkup  Vaccinations: Influenza vaccine: completed Pneumococcal vaccine: declined Tdap vaccine: completed Shingles vaccine: declined    Advanced directives: declined information  Conditions/risks identified: falls   Next appointment: 1 year   Preventive Care 10 Years and Older, Female Preventive care refers to lifestyle choices and visits with your health care provider that can promote health and wellness. What does preventive care include? A yearly physical exam. This is also called an annual well check. Dental exams once or twice a year. Routine eye exams. Ask your health care provider how often you should have your eyes checked. Personal lifestyle choices, including: Daily care of your teeth and gums. Regular physical activity. Eating a healthy diet. Avoiding tobacco and drug use. Limiting alcohol use. Practicing safe sex. Taking low-dose aspirin every day. Taking vitamin and mineral supplements as recommended by your health care provider. What happens during an annual well check? The services and screenings done by your health care provider during your annual well check will depend on your age, overall health, lifestyle risk factors, and family history of disease. Counseling  Your health care provider may ask you questions about your: Alcohol use. Tobacco use. Drug use. Emotional well-being. Home and relationship well-being. Sexual activity. Eating habits. History of falls. Memory and ability to understand (cognition). Work and work Astronomer. Reproductive  health. Screening  You may have the following tests or measurements: Height, weight, and BMI. Blood pressure. Lipid and cholesterol levels. These may be checked every 5 years, or more frequently if you are over 18 years old. Skin check. Lung cancer screening. You may have this screening every year starting at age 85 if you have a 30-pack-year history of smoking and currently smoke or have quit within the past 15 years. Fecal occult blood test (FOBT) of the stool. You may have this test every year starting at age 56. Flexible sigmoidoscopy or colonoscopy. You may have a sigmoidoscopy every 5 years or a colonoscopy every 10 years starting at age 3. Hepatitis C blood test. Hepatitis B blood test. Sexually transmitted disease (STD) testing. Diabetes screening. This is done by checking your blood sugar (glucose) after you have not eaten for a while (fasting). You may have this done every 1-3 years. Bone density scan. This is done to screen for osteoporosis. You may have this done starting at age 6. Mammogram. This may be done every 1-2 years. Talk to your health care provider about how often you should have regular mammograms. Talk with your health care provider about your test results, treatment options, and if necessary, the need for more tests. Vaccines  Your health care provider may recommend certain vaccines, such as: Influenza vaccine. This is recommended every year. Tetanus, diphtheria, and acellular pertussis (Tdap, Td) vaccine. You may need a Td booster every 10 years. Zoster vaccine. You may need this after age 63. Pneumococcal 13-valent conjugate (PCV13) vaccine. One dose is recommended after age 28. Pneumococcal polysaccharide (PPSV23) vaccine. One dose is recommended after age 74. Talk to your health care provider about which screenings and vaccines you need and how often you need them. This information is not intended to replace  advice given to you by your health care provider.  Make sure you discuss any questions you have with your health care provider. Document Released: 08/30/2015 Document Revised: 04/22/2016 Document Reviewed: 06/04/2015 Elsevier Interactive Patient Education  2017 [employer].  Fall Prevention in the Home Falls can cause injuries. They can happen to people of all ages. There are many things you can do to make your home safe and to help prevent falls. What can I do on the outside of my home? Regularly fix the edges of walkways and driveways and fix any cracks. Remove anything that might make you trip as you walk through a door, such as a raised step or threshold. Trim any bushes or trees on the path to your home. Use bright outdoor lighting. Clear any walking paths of anything that might make someone trip, such as rocks or tools. Regularly check to see if handrails are loose or broken. Make sure that both sides of any steps have handrails. Any raised decks and porches should have guardrails on the edges. Have any leaves, snow, or ice cleared regularly. Use sand or salt on walking paths during winter. Clean up any spills in your garage right away. This includes oil or grease spills. What can I do in the bathroom? Use night lights. Install grab bars by the toilet and in the tub and shower. Do not use towel bars as grab bars. Use non-skid mats or decals in the tub or shower. If you need to sit down in the shower, use a plastic, non-slip stool. Keep the floor dry. Clean up any water that spills on the floor as soon as it happens. Remove soap buildup in the tub or shower regularly. Attach bath mats securely with double-sided non-slip rug tape. Do not have throw rugs and other things on the floor that can make you trip. What can I do in the bedroom? Use night lights. Make sure that you have a light by your bed that is easy to reach. Do not use any sheets or blankets that are too big for your bed. They should not hang down onto the floor. Have a  firm chair that has side arms. You can use this for support while you get dressed. Do not have throw rugs and other things on the floor that can make you trip. What can I do in the kitchen? Clean up any spills right away. Avoid walking on wet floors. Keep items that you use a lot in easy-to-reach places. If you need to reach something above you, use a strong step stool that has a grab bar. Keep electrical cords out of the way. Do not use floor polish or wax that makes floors slippery. If you must use wax, use non-skid floor wax. Do not have throw rugs and other things on the floor that can make you trip. What can I do with my stairs? Do not leave any items on the stairs. Make sure that there are handrails on both sides of the stairs and use them. Fix handrails that are broken or loose. Make sure that handrails are as long as the stairways. Check any carpeting to make sure that it is firmly attached to the stairs. Fix any carpet that is loose or worn. Avoid having throw rugs at the top or bottom of the stairs. If you do have throw rugs, attach them to the floor with carpet tape. Make sure that you have a light switch at the top of the stairs and the bottom  of the stairs. If you do not have them, ask someone to add them for you. What else can I do to help prevent falls? Wear shoes that: Do not have high heels. Have rubber bottoms. Are comfortable and fit you well. Are closed at the toe. Do not wear sandals. If you use a stepladder: Make sure that it is fully opened. Do not climb a closed stepladder. Make sure that both sides of the stepladder are locked into place. Ask someone to hold it for you, if possible. Clearly mark and make sure that you can see: Any grab bars or handrails. First and last steps. Where the edge of each step is. Use tools that help you move around (mobility aids) if they are needed. These include: Canes. Walkers. Scooters. Crutches. Turn on the lights when you  go into a dark area. Replace any light bulbs as soon as they burn out. Set up your furniture so you have a clear path. Avoid moving your furniture around. If any of your floors are uneven, fix them. If there are any pets around you, be aware of where they are. Review your medicines with your doctor. Some medicines can make you feel dizzy. This can increase your chance of falling. Ask your doctor what other things that you can do to help prevent falls. This information is not intended to replace advice given to you by your health care provider. Make sure you discuss any questions you have with your health care provider. Document Released: 05/30/2009 Document Revised: 01/09/2016 Document Reviewed: 09/07/2014 Elsevier Interactive Patient Education  2017 [employer].

## 2023-10-13 ENCOUNTER — Ambulatory Visit: Payer: Medicare Other | Admitting: Internal Medicine

## 2023-10-13 DIAGNOSIS — N183 Chronic kidney disease, stage 3 unspecified: Secondary | ICD-10-CM

## 2023-10-13 DIAGNOSIS — M19171 Post-traumatic osteoarthritis, right ankle and foot: Secondary | ICD-10-CM

## 2023-10-13 DIAGNOSIS — Z5181 Encounter for therapeutic drug level monitoring: Secondary | ICD-10-CM

## 2023-10-13 DIAGNOSIS — M109 Gout, unspecified: Secondary | ICD-10-CM

## 2023-11-22 ENCOUNTER — Ambulatory Visit (INDEPENDENT_AMBULATORY_CARE_PROVIDER_SITE_OTHER): Payer: Medicare Other | Admitting: Family Medicine

## 2023-11-22 ENCOUNTER — Encounter (HOSPITAL_BASED_OUTPATIENT_CLINIC_OR_DEPARTMENT_OTHER): Payer: Self-pay | Admitting: Family Medicine

## 2023-11-22 VITALS — BP 137/73 | HR 58 | Ht 62.0 in | Wt 137.4 lb

## 2023-11-22 DIAGNOSIS — R3916 Straining to void: Secondary | ICD-10-CM | POA: Diagnosis not present

## 2023-11-22 DIAGNOSIS — I10 Essential (primary) hypertension: Secondary | ICD-10-CM | POA: Diagnosis not present

## 2023-11-22 DIAGNOSIS — M79672 Pain in left foot: Secondary | ICD-10-CM | POA: Diagnosis not present

## 2023-11-22 DIAGNOSIS — M79671 Pain in right foot: Secondary | ICD-10-CM

## 2023-11-22 MED ORDER — GABAPENTIN 100 MG PO CAPS: 100.0000 mg | ORAL_CAPSULE | Freq: Three times a day (TID) | ORAL | 1 refills | Status: DC

## 2023-11-22 NOTE — Patient Instructions (Signed)
  Medication Instructions:  Your physician recommends that you continue on your current medications as directed. Please refer to the Current Medication list given to you today. --If you need a refill on any your medications before your next appointment, please call your pharmacy first. If no refills are authorized on file call the office.--   Follow-Up: Your next appointment:   Your physician recommends that you schedule a follow-up appointment in: 4-6 week follow up with Dr. de Peru  You will receive a text message or e-mail with a link to a survey about your care and experience with [place] today! We would greatly appreciate your feedback!   Thanks for letting us be apart of your health journey!!  Primary Care and Sports Medicine   Dr. Ceasar Mons Peru   We encourage you to activate your patient portal called "MyChart".  Sign up information is provided on this After Visit Summary.  MyChart is used to connect with patients for Virtual Visits (Telemedicine).  Patients are able to view lab/test results, encounter notes, upcoming appointments, etc.  Non-urgent messages can be sent to your provider as well. To learn more about what you can do with MyChart, please visit --  [email or web address].

## 2023-11-22 NOTE — Assessment & Plan Note (Signed)
 Patient continues to have fluctuating symptoms in both feet related to feeling like they are very cold and that alternatively feeling like they are burning and on fire.  Symptoms will occur sporadically throughout the day.  Most notable at night, however will have some symptoms during the day as well, particularly if Alicia Rodgers is more active during the day and then will have pain after. On exam, normal appearance of both feet.  Normal DP and PT pulses bilaterally intact sensation throughout both feet when assessing with long element.  No obvious areas of skin breakdown over bottom of feet or between toes. History suggestive of peripheral neuropathy.  As reviewed previously, chart indicates history of diabetes, however find any objective evidence of diabetes diagnosis.  Hemoglobin A1c previously has been within prediabetes range.  We discussed options today.  Patient would be open to trial of medication.  We can proceed with gabapentin.  Will have to align for underlying CKD.  Discussed potential risks and side effects.  Will have patient start with 100 mg at night and we will monitor progress with this.  Will plan to follow-up in about 4 to 6 weeks to assess progress

## 2023-11-22 NOTE — Progress Notes (Signed)
    Procedures performed today:    None.  Independent interpretation of notes and tests performed by another provider:   None.  Brief History, Exam, Impression, and Recommendations:    BP 137/73 (BP Location: Right Arm, Patient Position: Sitting, Cuff Size: Normal)   Pulse (!) 58   Ht 5\' 2"  (1.575 m)   Wt 137 lb 6.4 oz (62.3 kg)   SpO2 100%   BMI 25.13 kg/m   Pain in both feet Assessment & Plan: Patient continues to have fluctuating symptoms in both feet related to feeling like they are very cold and that alternatively feeling like they are burning and on fire.  Symptoms will occur sporadically throughout the day.  Most notable at night, however will have some symptoms during the day as well, particularly if she is more active during the day and then will have pain after. On exam, normal appearance of both feet.  Normal DP and PT pulses bilaterally intact sensation throughout both feet when assessing with long element.  No obvious areas of skin breakdown over bottom of feet or between toes. History suggestive of peripheral neuropathy.  As reviewed previously, chart indicates history of diabetes, however find any objective evidence of diabetes diagnosis.  Hemoglobin A1c previously has been within prediabetes range.  We discussed options today.  Patient would be open to trial of medication.  We can proceed with gabapentin.  Will have to align for underlying CKD.  Discussed potential risks and side effects.  Will have patient start with 100 mg at night and we will monitor progress with this.  Will plan to follow-up in about 4 to 6 weeks to assess progress   Essential hypertension Assessment & Plan: Patient continues with atenolol, hydrochlorothiazide, valsartan.  She continues to follow with cardiology as well.  She does occasionally check her blood pressure at home, however typically she feels that she will know when her blood pressure is running high and will check it at that time.  No  current issues with chest pain, headaches, lightheadedness or dizziness. Blood pressure borderline in office.  At this time, can continue with current medication regimen, no changes today Recommend continuing with follow-up with cardiology as scheduled.  Recommend intermittent monitoring of blood pressure at home, DASH diet   Urinary straining Assessment & Plan: Patient notes several month history of pain or straining.  She feels that at times she will have to bear down and strain just to urinate.  Has some mild symptoms during the day, she does feel it is more notable at night.  She has not noticed any pain or burning with urination.  No blood noted in urine. We can proceed with referral today for further assessment with urology  Orders: -     Ambulatory referral to Urology  Other orders -     Gabapentin; Take 1 capsule (100 mg total) by mouth 3 (three) times daily.  Dispense: 90 capsule; Refill: 1  Return in about 4 weeks (around 12/20/2023) for med check.   ___________________________________________ Marilu Rylander de [place], MD, ABFM, CAQSM Primary Care and Sports Medicine [facility]

## 2023-11-22 NOTE — Assessment & Plan Note (Signed)
 Patient notes several month history of pain or straining.  She feels that at times she will have to bear down and strain just to urinate.  Has some mild symptoms during the day, she does feel it is more notable at night.  She has not noticed any pain or burning with urination.  No blood noted in urine. We can proceed with referral today for further assessment with urology

## 2023-11-22 NOTE — Assessment & Plan Note (Addendum)
 Patient continues with atenolol, hydrochlorothiazide, valsartan.  She continues to follow with cardiology as well.  She does occasionally check her blood pressure at home, however typically she feels that she will know when her blood pressure is running high and will check it at that time.  No current issues with chest pain, headaches, lightheadedness or dizziness. Blood pressure borderline in office.  At this time, can continue with current medication regimen, no changes today Recommend continuing with follow-up with cardiology as scheduled.  Recommend intermittent monitoring of blood pressure at home, DASH diet

## 2024-01-14 ENCOUNTER — Other Ambulatory Visit (HOSPITAL_BASED_OUTPATIENT_CLINIC_OR_DEPARTMENT_OTHER): Payer: Self-pay | Admitting: Cardiovascular Disease

## 2024-01-14 DIAGNOSIS — I10 Essential (primary) hypertension: Secondary | ICD-10-CM

## 2024-01-18 ENCOUNTER — Ambulatory Visit (INDEPENDENT_AMBULATORY_CARE_PROVIDER_SITE_OTHER): Admitting: Family Medicine

## 2024-01-18 ENCOUNTER — Encounter (HOSPITAL_BASED_OUTPATIENT_CLINIC_OR_DEPARTMENT_OTHER): Payer: Self-pay | Admitting: Family Medicine

## 2024-01-18 VITALS — BP 151/72 | HR 64 | Ht 62.0 in | Wt 136.8 lb

## 2024-01-18 DIAGNOSIS — R7303 Prediabetes: Secondary | ICD-10-CM | POA: Diagnosis not present

## 2024-01-18 DIAGNOSIS — I10 Essential (primary) hypertension: Secondary | ICD-10-CM

## 2024-01-18 DIAGNOSIS — Z7984 Long term (current) use of oral hypoglycemic drugs: Secondary | ICD-10-CM

## 2024-01-18 DIAGNOSIS — H18413 Arcus senilis, bilateral: Secondary | ICD-10-CM | POA: Diagnosis not present

## 2024-01-18 DIAGNOSIS — Z961 Presence of intraocular lens: Secondary | ICD-10-CM | POA: Diagnosis not present

## 2024-01-18 DIAGNOSIS — E785 Hyperlipidemia, unspecified: Secondary | ICD-10-CM

## 2024-01-18 DIAGNOSIS — E1169 Type 2 diabetes mellitus with other specified complication: Secondary | ICD-10-CM

## 2024-01-18 DIAGNOSIS — H02839 Dermatochalasis of unspecified eye, unspecified eyelid: Secondary | ICD-10-CM | POA: Diagnosis not present

## 2024-01-18 DIAGNOSIS — H04123 Dry eye syndrome of bilateral lacrimal glands: Secondary | ICD-10-CM | POA: Diagnosis not present

## 2024-01-18 LAB — BASIC METABOLIC PANEL WITH GFR
BUN/Creatinine Ratio: 20 (ref 12–28)
BUN: 23 mg/dL (ref 8–27)
CO2: 22 mmol/L (ref 20–29)
Calcium: 10.2 mg/dL (ref 8.7–10.3)
Chloride: 102 mmol/L (ref 96–106)
Creatinine, Ser: 1.14 mg/dL — ABNORMAL HIGH (ref 0.57–1.00)
Glucose: 110 mg/dL — ABNORMAL HIGH (ref 70–99)
Potassium: 4 mmol/L (ref 3.5–5.2)
Sodium: 142 mmol/L (ref 134–144)
eGFR: 49 mL/min/{1.73_m2} — ABNORMAL LOW (ref >59)

## 2024-01-18 LAB — HEMOGLOBIN A1C
Est. average glucose Bld gHb Est-mCnc: 128 mg/dL
Hgb A1c MFr Bld: 6.1 % — ABNORMAL HIGH (ref 4.8–5.6)

## 2024-01-18 LAB — LIPID PANEL
Chol/HDL Ratio: 3.2 ratio (ref 0.0–4.4)
Cholesterol, Total: 181 mg/dL (ref 100–199)
HDL: 57 mg/dL (ref >39)
LDL Chol Calc (NIH): 107 mg/dL — ABNORMAL HIGH (ref 0–99)
Triglycerides: 91 mg/dL (ref 0–149)
VLDL Cholesterol Cal: 17 mg/dL (ref 5–40)

## 2024-01-18 NOTE — Assessment & Plan Note (Signed)
 Patient continues with pravastatin , denies any issues with medication.  Has not had cholesterol panel checked in about 2 years, we can proceed with checking today as she is fasting at this time.  No other specific concerns currently.

## 2024-01-18 NOTE — Assessment & Plan Note (Signed)
 Patient continues with atenolol, hydrochlorothiazide, valsartan.  She continues to follow with cardiology as well.  She does occasionally check her blood pressure at home, however typically she feels that she will know when her blood pressure is running high and will check it at that time.  No current issues with chest pain, headaches, lightheadedness or dizziness. Blood pressure borderline in office.  At this time, can continue with current medication regimen, no changes today Recommend continuing with follow-up with cardiology as scheduled.  Recommend intermittent monitoring of blood pressure at home, DASH diet

## 2024-01-18 NOTE — Patient Instructions (Signed)
  Medication Instructions:  Your physician recommends that you continue on your current medications as directed. Please refer to the Current Medication list given to you today. --If you need a refill on any your medications before your next appointment, please call your pharmacy first. If no refills are authorized on file call the office.-- Lab Work: Your physician has recommended that you have lab work today: today If you have labs (blood work) drawn today and your tests are completely normal, you will receive your results via MyChart message OR a phone call from our staff.  Please ensure you check your voicemail in the event that you authorized detailed messages to be left on a delegated number. If you have any lab test that is abnormal or we need to change your treatment, we will call you to review the results.   Follow-Up: Your next appointment:   Your physician recommends that you schedule a follow-up appointment in: 4 month follow up  with Dr. de [place]  You will receive a text message or e-mail with a link to a survey about your care and experience with [place] today! We would greatly appreciate your feedback!   Thanks for letting us be apart of your health journey!!  Primary Care and Sports Medicine   Dr. Ceasar Mons [place]   We encourage you to activate your patient portal called "MyChart".  Sign up information is provided on this After Visit Summary.  MyChart is used to connect with patients for Virtual Visits (Telemedicine).  Patients are able to view lab/test results, encounter notes, upcoming appointments, etc.  Non-urgent messages can be sent to your provider as well. To learn more about what you can do with MyChart, please visit --  [email or web address].

## 2024-01-18 NOTE — Progress Notes (Signed)
    Procedures performed today:    None.  Independent interpretation of notes and tests performed by another provider:   None.  Brief History, Exam, Impression, and Recommendations:    BP (!) 151/72 (BP Location: Right Arm, Patient Position: Sitting, Cuff Size: Normal)   Pulse 64   Ht 5\' 2"  (1.575 m)   Wt 136 lb 12.8 oz (62.1 kg)   SpO2 98%   BMI 25.02 kg/m   Essential hypertension Assessment & Plan: Patient continues with atenolol , hydrochlorothiazide , valsartan .  She continues to follow with cardiology as well.  She does occasionally check her blood pressure at home, however typically she feels that she will know when her blood pressure is running high and will check it at that time.  No current issues with chest pain, headaches, lightheadedness or dizziness. Blood pressure borderline in office.  At this time, can continue with current medication regimen, no changes today Recommend continuing with follow-up with cardiology as scheduled.  Recommend intermittent monitoring of blood pressure at home, DASH diet  Orders: -     Basic metabolic panel with GFR  Type 2 diabetes mellitus with other specified complication, without long-term current use of insulin (HCC) -     Microalbumin / creatinine urine ratio  Pre-diabetes Assessment & Plan: Uncertain history of prediabetes versus diabetes mellitus. A1c available for review in chart from labs that we have done have been within prediabetes range.  Patient denies ever being told about diagnosis of diabetes.  On reviewing records through [facility] Everywhere, can see billing including diagnosis of type 2 diabetes, however cannot see documentation.  We will need to request records to review prior labs clarify diagnosis At this time, patient continues with lifestyle modifications, no medications utilized No current issues with polyuria or polydipsia For now we can continue with lifestyle modifications.  We can proceed with check of hemoglobin  A1c today for monitoring  Orders: -     Hemoglobin A1c  Hyperlipidemia, unspecified hyperlipidemia type Assessment & Plan: Patient continues with pravastatin , denies any issues with medication.  Has not had cholesterol panel checked in about 2 years, we can proceed with checking today as she is fasting at this time.  No other specific concerns currently.  Orders: -     Lipid panel  Return in about 4 months (around 05/19/2024) for hypertension, prediabetes.   ___________________________________________ Selah Klang de [place], MD, ABFM, CAQSM Primary Care and Sports Medicine [facility]

## 2024-01-18 NOTE — Assessment & Plan Note (Signed)
 Uncertain history of prediabetes versus diabetes mellitus. A1c available for review in chart from labs that we have done have been within prediabetes range.  Patient denies ever being told about diagnosis of diabetes.  On reviewing records through Care Everywhere, can see billing including diagnosis of type 2 diabetes, however cannot see documentation.  We will need to request records to review prior labs clarify diagnosis At this time, patient continues with lifestyle modifications, no medications utilized No current issues with polyuria or polydipsia For now we can continue with lifestyle modifications.  We can proceed with check of hemoglobin A1c today for monitoring

## 2024-01-19 LAB — MICROALBUMIN / CREATININE URINE RATIO
Creatinine, Urine: 216 mg/dL
Microalb/Creat Ratio: 6 mg/g{creat} (ref 0–29)
Microalbumin, Urine: 13.3 ug/mL

## 2024-01-20 DIAGNOSIS — R3916 Straining to void: Secondary | ICD-10-CM | POA: Diagnosis not present

## 2024-01-25 ENCOUNTER — Other Ambulatory Visit: Payer: Self-pay | Admitting: Urology

## 2024-01-25 DIAGNOSIS — N1831 Chronic kidney disease, stage 3a: Secondary | ICD-10-CM

## 2024-01-26 ENCOUNTER — Encounter: Payer: Self-pay | Admitting: Neurology

## 2024-02-04 ENCOUNTER — Ambulatory Visit
Admission: RE | Admit: 2024-02-04 | Discharge: 2024-02-04 | Disposition: A | Discharge status: Home / Self Care | Source: Ambulatory Visit | Attending: Urology | Admitting: Urology

## 2024-02-04 DIAGNOSIS — N183 Chronic kidney disease, stage 3 unspecified: Secondary | ICD-10-CM | POA: Diagnosis not present

## 2024-02-04 DIAGNOSIS — N1831 Chronic kidney disease, stage 3a: Secondary | ICD-10-CM

## 2024-03-21 ENCOUNTER — Encounter: Payer: Self-pay | Admitting: Neurology

## 2024-03-21 ENCOUNTER — Other Ambulatory Visit

## 2024-03-21 ENCOUNTER — Ambulatory Visit (INDEPENDENT_AMBULATORY_CARE_PROVIDER_SITE_OTHER): Admitting: Neurology

## 2024-03-21 VITALS — BP 126/69 | HR 69 | Ht 62.0 in | Wt 137.0 lb

## 2024-03-21 DIAGNOSIS — R202 Paresthesia of skin: Secondary | ICD-10-CM

## 2024-03-21 NOTE — Patient Instructions (Signed)
Check labs  ELECTROMYOGRAM AND NERVE CONDUCTION STUDIES (EMG/NCS) INSTRUCTIONS  How to Prepare The neurologist conducting the EMG will need to know if you have certain medical conditions. Tell the neurologist and other EMG lab personnel if you: . Have a pacemaker or any other electrical medical device . Take blood-thinning medications . Have hemophilia, a blood-clotting disorder that causes prolonged bleeding Bathing Take a shower or bath shortly before your exam in order to remove oils from your skin. Don't apply lotions or creams before the exam.  What to Expect You'll likely be asked to change into a hospital gown for the procedure and lie down on an examination table. The following explanations can help you understand what will happen during the exam.  . Electrodes. The neurologist or a technician places surface electrodes at various locations on your skin depending on where you're experiencing symptoms. Or the neurologist may insert needle electrodes at different sites depending on your symptoms.  . Sensations. The electrodes will at times transmit a tiny electrical current that you may feel as a twinge or spasm. The needle electrode may cause discomfort or pain that usually ends shortly after the needle is removed. If you are concerned about discomfort or pain, you may want to talk to the neurologist about taking a short break during the exam.  . Instructions. During the needle EMG, the neurologist will assess whether there is any spontaneous electrical activity when the muscle is at rest - activity that isn't present in healthy muscle tissue - and the degree of activity when you slightly contract the muscle.  He or she will give you instructions on resting and contracting a muscle at appropriate times. Depending on what muscles and nerves the neurologist is examining, he or she may ask you to change positions during the exam.  After your EMG You may experience some temporary, minor bruising  where the needle electrode was inserted into your muscle. This bruising should fade within several days. If it persists, contact your primary care doctor.    

## 2024-03-21 NOTE — Progress Notes (Signed)
 [facility] Neurology Division Clinic Note - Initial Visit   Date: 03/21/2024   [name] MRN: [id number] DOB: 02-Jun-1946   Dear Dr. de Peru:  Thank you for your kind referral of Alicia Rodgers for consultation of bilateral feet pain. Although her history is well known to you, please allow us  to reiterate it for the purpose of our medical record. The patient was accompanied to the clinic by daughter who also provides collateral information.     Alicia Rodgers is a 78 y.o. right-handed female with atrial fibrillation, hypertension, hyperlipidemia, history of cervical cancer, and gout presenting for evaluation of bilateral feet pain.   IMPRESSION/PLAN: Bilateral feet paresthesias, worse on the soles of the feet.  Neurological exam is remarkably normal with intact distal sensation, strength, and reflexes.  Small fiber neuropathy is possible.  I will start with NCS/EMG of the legs to see if there is large fiber neuropathy vs S1 radiculopathy.  Consider skin biopsy going forward.   - Check vitamin B12, folate, SPEP with IFE  - NCS/EMG of the legs  - Gabapentin  discussed, she opted to complete testing and then decide about medication  Further recommendations pending results.  ------------------------------------------------------------- History of present illness: Starting around 2024, she began having burning sensation involving the soles of the feet.  Occasionally, she will have cold sensation as if they were in ice.  Symptoms have become more constant and worse with prolonged walking. It is more bothersome at night time.  No low back pain, leg weakness, or imbalance.  She had one mechanical fall over the weekend where a child collided on the stairs.  Fortunately, no injuries.    She does not drink alcohol or smoke.  She is prediabetic.  No history of chemotherapy exposure.   Out-side paper records, electronic medical record, and images have been reviewed where available and  summarized as:  Lab Results  Component Value Date   HGBA1C 6.1 (H) 01/18/2024   Lab Results  Component Value Date   TSH 1.080 07/22/2023    Past Medical History:  Diagnosis Date   Anemia    Atrial fibrillation (HCC)    CERVICAL CANCER, HX OF    Fibrocystic breast    HYPERLIPIDEMIA    HYPERTENSION    Peripheral neuropathy    RHEUMATIC FEVER, HX OF     Past Surgical History:  Procedure Laterality Date   APPENDECTOMY     BREAST CYST EXCISION     BREAST EXCISIONAL BIOPSY Left    benign   CARDIOVERSION N/A 10/06/2018   Procedure: CARDIOVERSION;  Surgeon: Delford Maude BROCKS, MD;  Location: [facility];  Service: Cardiovascular;  Laterality: N/A;   CESAREAN SECTION     x3   TONSILLECTOMY     TOTAL ABDOMINAL HYSTERECTOMY       Medications:  Outpatient Encounter Medications as of 03/21/2024  Medication Sig   atenolol  (TENORMIN ) 25 MG tablet TAKE ONE TABLET BY MOUTH TWICE DAILY   colchicine  0.6 MG tablet Take 0.6 mg by mouth daily.   ELIQUIS  5 MG TABS tablet TAKE 1 TABLET TWICE DAILY   hydrochlorothiazide  (HYDRODIURIL ) 12.5 MG tablet Take 1 tablet (12.5 mg total) by mouth daily.   Multiple Vitamins-Calcium (DAILY VITAMINS FOR WOMEN PO) Take 1 tablet by mouth daily.   pravastatin  (PRAVACHOL ) 10 MG tablet Take 1 tablet (10 mg total) by mouth daily.   valsartan  (DIOVAN ) 320 MG tablet Take 1 tablet (320 mg total) by mouth daily.   allopurinol  (ZYLOPRIM ) 100 MG  tablet Take 1 tablet (100 mg total) by mouth daily. (Patient not taking: Reported on 03/21/2024)   esomeprazole (NEXIUM) 20 MG capsule Take 20 mg by mouth daily as needed (heartburn). (Patient not taking: Reported on 03/21/2024)   gabapentin  (NEURONTIN ) 100 MG capsule Take 1 capsule (100 mg total) by mouth 3 (three) times daily. (Patient not taking: Reported on 03/21/2024)   No facility-administered encounter medications on file as of 03/21/2024.    Allergies:  Allergies  Allergen Reactions   Penicillins Swelling and Rash    Did  it involve swelling of the face/tongue/throat, SOB, or low BP? No Did it involve sudden or severe rash/hives, skin peeling, or any reaction on the inside of your mouth or nose? No Did you need to seek medical attention at a hospital or doctor's office? No When did it last happen?      40 years ago If all above answers are "NO", may proceed with cephalosporin use.      Family History: Family History  Problem Relation Age of Onset   Stroke Mother    Heart disease Father    Hypertension Brother    Hyperlipidemia Brother    Cancer Maternal Grandmother        Unsure   Heart disease Maternal Grandfather    Stroke Paternal Grandfather    Breast cancer Maternal Aunt    Breast cancer Paternal Aunt    Thyroid  cancer Paternal Aunt     Social History: Social History   Tobacco Use   Smoking status: Never    Passive exposure: Never   Smokeless tobacco: Never  Vaping Use   Vaping status: Never Used  Substance Use Topics   Alcohol use: No   Drug use: No   Social History   Social History Narrative   Lives with her daughter and her family.   Right-handed.   1 cup caffeine daily.    Vital Signs:  BP 126/69 (BP Location: Left Arm, Patient Position: Sitting, Cuff Size: Normal)   Pulse 69   Ht 5' 2 (1.575 m)   Wt 137 lb (62.1 kg)   SpO2 97%   BMI 25.06 kg/m   Neurological Exam: MENTAL STATUS including orientation to time, place, person, recent and remote memory, attention span and concentration, language, and fund of knowledge is normal.  Speech is not dysarthric.  CRANIAL NERVES: II:  No visual field defects.     III-IV-VI: Pupils equal round and reactive to light.  Normal conjugate, extra-ocular eye movements in all directions of gaze.  No nystagmus.  No ptosis.   V:  Normal facial sensation.    VII:  Normal facial symmetry and movements.   VIII:  Normal hearing and vestibular function.   IX-X:  Normal palatal movement.   XI:  Normal shoulder shrug and head rotation.    XII:  Normal tongue strength and range of motion, no deviation or fasciculation.  MOTOR:  No atrophy, fasciculations or abnormal movements.  No pronator drift.   Upper Extremity:  Right  Left  Deltoid  5/5   5/5   Biceps  5/5   5/5   Triceps  5/5   5/5   Wrist extensors  5/5   5/5   Wrist flexors  5/5   5/5   Finger extensors  5/5   5/5   Finger flexors  5/5   5/5   Dorsal interossei  5/5   5/5   Abductor pollicis  5/5   5/5   Tone (Ashworth  scale)  0  0   Lower Extremity:  Right  Left  Hip flexors  5/5   5/5   Knee flexors  5/5   5/5   Knee extensors  5/5   5/5   Dorsiflexors  5/5   5/5   Plantarflexors  5/5   5/5   Toe extensors  5/5   5/5   Toe flexors  5/5   5/5   Tone (Ashworth scale)  0  0   MSRs:                                           Right        Left brachioradialis 2+  2+  biceps 2+  2+  triceps 2+  2+  patellar 2+  2+  ankle jerk 2+  2+  Hoffman no  no  plantar response down  down   SENSORY:  Normal and symmetric perception of light touch, pinprick, vibration, and temperature.  Romberg's sign absent.   COORDINATION/GAIT: Normal finger-to- nose-finger.  Intact rapid alternating movements bilaterally.  Gait narrow based and stable. Tandem and stressed gait intact.     Thank you for allowing me to participate in patient's care.  If I can answer any additional questions, I would be pleased to do so.    Sincerely,    Moris Ratchford K. Tobie, DO

## 2024-03-23 ENCOUNTER — Ambulatory Visit (INDEPENDENT_AMBULATORY_CARE_PROVIDER_SITE_OTHER): Admitting: Neurology

## 2024-03-23 DIAGNOSIS — R202 Paresthesia of skin: Secondary | ICD-10-CM

## 2024-03-23 NOTE — Procedures (Signed)
 [facility] Neurology  [address], [address]  [place], [place] [address] Tel: [phone] Fax: [phone] Test Date:  03/23/2024  Patient: Alicia Rodgers DOB: Dec 09, 1945 Physician: Tonita Blanch, DO  Sex: Female Height: 5' 2 Ref Phys: Tonita Blanch, DO  ID#: [id number]   Technician:    History: This is a 78 year old female referred for evaluation of bilateral feet paresthesias and pain.  NCV & EMG Findings: Electrodiagnostic testing of the right lower extremity and additional studies of the left shows: Bilateral sural and superficial peroneal sensory responses are within normal limits. Bilateral peroneal and tibial motor responses are within normal limits. Right tibial H reflex studies are within normal limits. There is no evidence of active or chronic motor axonal changes affecting any of the tested muscles.  Motor unit configuration and recruitment pattern is within normal limits.  Proximal and deep muscles were not tested as the patient is on anticoagulation therapy.   Impression: This is a normal study of the lower extremities.  In particular, there is no evidence of a large fiber sensorimotor polyneuropathy or lumbosacral radiculopathy.    ___________________________ Tonita Blanch, DO    Nerve Conduction Studies   Stim Site NR Peak (ms) Norm Peak (ms) O-P Amp (V) Norm O-P Amp  Left Sup Peroneal Anti Sensory (Ant Lat Mall)  32 C  12 cm    3.2 <4.6 5.5 >3  Right Sup Peroneal Anti Sensory (Ant Lat Mall)  32 C  12 cm    2.8 <4.6 5.8 >3  Left Sural Anti Sensory (Lat Mall)  32 C  Calf    3.3 <4.6 8.7 >3  Right Sural Anti Sensory (Lat Mall)  32 C  Calf    2.8 <4.6 10.5 >3     Stim Site NR Onset (ms) Norm Onset (ms) O-P Amp (mV) Norm O-P Amp Site1 Site2 Delta-0 (ms) Dist (cm) Vel (m/s) Norm Vel (m/s)  Left Peroneal Motor (Ext Dig Brev)  32 C  Ankle    3.8 <6.0 5.4 >2.5 B Fib Ankle 6.7 33.0 49 >40  B Fib    10.5  4.5  Poplt B Fib 1.7 8.0 47 >40  Poplt    12.2   3.9         Right Peroneal Motor (Ext Dig Brev)  32 C  Ankle    2.9 <6.0 5.6 >2.5 B Fib Ankle 6.1 34.0 56 >40  B Fib    9.0  5.0  Poplt B Fib 1.5 8.0 53 >40  Poplt    10.5  4.8         Left Tibial Motor (Abd Hall Brev)  32 C  Ankle    4.5 <6.0 12.8 >4 Knee Ankle 7.3 40.0 55 >40  Knee    11.8  8.4         Right Tibial Motor (Abd Hall Brev)  32 C  Ankle    3.9 <6.0 16.2 >4 Knee Ankle 7.3 42.0 58 >40  Knee    11.2  13.2          Electromyography   Side Muscle Ins.Act Fibs Fasc Recrt Amp Dur Poly Activation Comment  Right AntTibialis Nml Nml Nml Nml Nml Nml Nml Nml N/A  Right Gastroc Nml Nml Nml Nml Nml Nml Nml Nml N/A  Right RectFemoris Nml Nml Nml Nml Nml Nml Nml Nml N/A  Left AntTibialis Nml Nml Nml Nml Nml Nml Nml Nml N/A  Left Gastroc Nml Nml Nml Nml Nml Nml Nml Nml  N/A  Left RectFemoris Nml Nml Nml Nml Nml Nml Nml Nml N/A      Waveforms:

## 2024-03-24 ENCOUNTER — Ambulatory Visit: Payer: Self-pay | Admitting: Neurology

## 2024-03-25 LAB — PROTEIN ELECTROPHORESIS, SERUM
Albumin ELP: 4.1 g/dL (ref 3.8–4.8)
Alpha 1: 0.3 g/dL (ref 0.2–0.3)
Alpha 2: 0.8 g/dL (ref 0.5–0.9)
Beta 2: 0.5 g/dL (ref 0.2–0.5)
Beta Globulin: 0.5 g/dL (ref 0.4–0.6)
Gamma Globulin: 1.1 g/dL (ref 0.8–1.7)
Total Protein: 7.2 g/dL (ref 6.1–8.1)

## 2024-03-25 LAB — IMMUNOFIXATION ELECTROPHORESIS
IgG (Immunoglobin G), Serum: 1302 mg/dL (ref 600–1540)
IgM, Serum: 89 mg/dL (ref 50–300)
Immunoglobulin A: 293 mg/dL (ref 70–320)

## 2024-03-25 LAB — FOLATE: Folate: 16.1 ng/mL

## 2024-03-25 LAB — VITAMIN B12: Vitamin B-12: 387 pg/mL (ref 200–1100)

## 2024-05-01 DIAGNOSIS — R3916 Straining to void: Secondary | ICD-10-CM | POA: Diagnosis not present

## 2024-05-19 ENCOUNTER — Encounter (HOSPITAL_BASED_OUTPATIENT_CLINIC_OR_DEPARTMENT_OTHER): Payer: Self-pay | Admitting: *Deleted

## 2024-05-19 ENCOUNTER — Ambulatory Visit (HOSPITAL_BASED_OUTPATIENT_CLINIC_OR_DEPARTMENT_OTHER): Admitting: Family Medicine

## 2024-05-19 ENCOUNTER — Encounter (HOSPITAL_BASED_OUTPATIENT_CLINIC_OR_DEPARTMENT_OTHER): Payer: Self-pay | Admitting: Family Medicine

## 2024-05-19 VITALS — BP 126/73 | HR 59 | Ht 62.0 in | Wt 137.4 lb

## 2024-05-19 DIAGNOSIS — R7303 Prediabetes: Secondary | ICD-10-CM | POA: Diagnosis not present

## 2024-05-19 DIAGNOSIS — Z7901 Long term (current) use of anticoagulants: Secondary | ICD-10-CM | POA: Insufficient documentation

## 2024-05-19 DIAGNOSIS — I10 Essential (primary) hypertension: Secondary | ICD-10-CM

## 2024-05-19 DIAGNOSIS — R209 Unspecified disturbances of skin sensation: Secondary | ICD-10-CM | POA: Insufficient documentation

## 2024-05-19 DIAGNOSIS — M109 Gout, unspecified: Secondary | ICD-10-CM | POA: Insufficient documentation

## 2024-05-19 DIAGNOSIS — Z23 Encounter for immunization: Secondary | ICD-10-CM | POA: Diagnosis not present

## 2024-05-19 DIAGNOSIS — F419 Anxiety disorder, unspecified: Secondary | ICD-10-CM | POA: Insufficient documentation

## 2024-05-19 LAB — POCT GLYCOSYLATED HEMOGLOBIN (HGB A1C)
HbA1c POC (<> result, manual entry): 5.8 % (ref 4.0–5.6)
HbA1c, POC (controlled diabetic range): 5.8 % (ref 0.0–7.0)
HbA1c, POC (prediabetic range): 5.8 % (ref 5.7–6.4)
Hemoglobin A1C: 5.8 % — AB (ref 4.0–5.6)

## 2024-05-19 NOTE — Progress Notes (Signed)
    Procedures performed today:    None.  Independent interpretation of notes and tests performed by another provider:   None.  Brief History, Exam, Impression, and Recommendations:    BP 126/73 (BP Location: Left Arm, Patient Position: Sitting, Cuff Size: Normal)   Pulse (!) 59   Ht 5' 2 (1.575 m)   Wt 137 lb 6.4 oz (62.3 kg)   SpO2 100%   BMI 25.13 kg/m   Essential hypertension Assessment & Plan: Patient continues with atenolol , hydrochlorothiazide , valsartan .  She continues to follow with cardiology as well.  She does occasionally check her blood pressure at home.  No current issues with chest pain, headaches, lightheadedness or dizziness. Blood pressure borderline in office, did improve on recheck and at goal.  At this time, can continue with current medication regimen, no changes today Recommend continuing with follow-up with cardiology as scheduled.  Recommend intermittent monitoring of blood pressure at home, DASH diet   Encounter for immunization -     Flu vaccine HIGH DOSE PF(Fluzone Trivalent)  Pre-diabetes Assessment & Plan: Uncertain history of prediabetes versus diabetes mellitus. A1c available for review in chart from labs that we have done have been within prediabetes range.  Patient denies ever being told about diagnosis of diabetes.  On reviewing records through Care Everywhere, can see billing including diagnosis of type 2 diabetes, however cannot see documentation. At this time, patient continues with lifestyle modifications, no medications utilized No current issues with polyuria or polydipsia For now we can continue with lifestyle modifications.  We can proceed with check of hemoglobin A1c today for monitoring - this remains appropriate at 5.8%.  Orders: -     POCT glycosylated hemoglobin (Hb A1C)  Return in about 4 months (around 09/19/2024) for hypertension, prediabetes.   ___________________________________________ Alicia Dehoyos de [place], MD, ABFM,  CAQSM Primary Care and Sports Medicine [facility]

## 2024-05-19 NOTE — Assessment & Plan Note (Addendum)
 Uncertain history of prediabetes versus diabetes mellitus. A1c available for review in chart from labs that we have done have been within prediabetes range.  Patient denies ever being told about diagnosis of diabetes.  On reviewing records through Care Everywhere, can see billing including diagnosis of type 2 diabetes, however cannot see documentation. At this time, patient continues with lifestyle modifications, no medications utilized No current issues with polyuria or polydipsia For now we can continue with lifestyle modifications.  We can proceed with check of hemoglobin A1c today for monitoring - this remains appropriate at 5.8%.

## 2024-05-19 NOTE — Patient Instructions (Signed)

## 2024-05-19 NOTE — Assessment & Plan Note (Addendum)
 Patient continues with atenolol , hydrochlorothiazide , valsartan .  She continues to follow with cardiology as well.  She does occasionally check her blood pressure at home.  No current issues with chest pain, headaches, lightheadedness or dizziness. Blood pressure borderline in office, did improve on recheck and at goal.  At this time, can continue with current medication regimen, no changes today Recommend continuing with follow-up with cardiology as scheduled.  Recommend intermittent monitoring of blood pressure at home, DASH diet

## 2024-06-08 ENCOUNTER — Other Ambulatory Visit: Payer: Self-pay | Admitting: Endocrinology

## 2024-06-08 DIAGNOSIS — E042 Nontoxic multinodular goiter: Secondary | ICD-10-CM

## 2024-06-21 ENCOUNTER — Ambulatory Visit
Admission: RE | Admit: 2024-06-21 | Discharge: 2024-06-21 | Disposition: A | Discharge status: Home / Self Care | Source: Ambulatory Visit | Attending: Endocrinology | Admitting: Endocrinology

## 2024-06-21 DIAGNOSIS — E042 Nontoxic multinodular goiter: Secondary | ICD-10-CM | POA: Diagnosis not present

## 2024-07-19 DIAGNOSIS — E042 Nontoxic multinodular goiter: Secondary | ICD-10-CM | POA: Diagnosis not present

## 2024-07-28 ENCOUNTER — Encounter: Payer: Self-pay | Admitting: Cardiovascular Disease

## 2024-08-18 ENCOUNTER — Encounter (HOSPITAL_BASED_OUTPATIENT_CLINIC_OR_DEPARTMENT_OTHER): Payer: Self-pay | Admitting: Family Medicine

## 2024-08-25 NOTE — Progress Notes (Signed)
 "   Cardiology Office Note   Date:  09/05/2024   ID:  Rodgers, Alicia 12/12/1945, MRN [id number]  PCP:  de [place], [name], MD  Cardiologist:  Dr. Delford     Chief Complaint  Patient presents with   Hypertension   Atrial Fibrillation   Follow-up    1 year       History of Present Illness: Alicia Rodgers is a 79 y.o. female who presents for atrial fib. Diagnosed on office visit 08/25/18 TTE normal EF LA 44 mm  [facility] 10/06/18 with conversion but ERAF Decision made to rate control CHADVAS 3 on Eliquis  and beta blocker   Eliquis  is expensive until she reaches her deductible   No cardiac issues History of gout in right foot Rx colchicine    Seen in ED 04/07/22 with headache and elevated BP.  CT/MRI head negative Noted 2 cm tyroid nodule being w/u now US  FNA BX done 11/15  BP improved on current meds My reading today 130/80 mmHg  TTE 07/29/22 reviewed EF 60-65% mild MR severe bi atrial enlargement   Seems to be developing mild DM and neuropathy in feet  04/26/23 Rx for community acquired pneumonia with Levaquin  CXR did not show any infiltrate   I took care of her husband Alicia many years ago before he passed. I also see her daughter Alicia Rodgers who has MVP   Her eliquis  is about $220 / month out of pocket   Past Medical History:  Diagnosis Date   Anemia    Atrial fibrillation (HCC)    CERVICAL CANCER, HX OF    Fibrocystic breast    HYPERLIPIDEMIA    HYPERTENSION    Peripheral neuropathy    RHEUMATIC FEVER, HX OF     Past Surgical History:  Procedure Laterality Date   APPENDECTOMY     BREAST CYST EXCISION     BREAST EXCISIONAL BIOPSY Left    benign   CARDIOVERSION N/A 10/06/2018   Procedure: CARDIOVERSION;  Surgeon: Delford Maude BROCKS, MD;  Location: [facility];  Service: Cardiovascular;  Laterality: N/A;   CESAREAN SECTION     x3   TONSILLECTOMY     TOTAL ABDOMINAL HYSTERECTOMY       Current Outpatient Medications  Medication Sig Dispense Refill   atenolol   (TENORMIN ) 25 MG tablet TAKE ONE TABLET BY MOUTH TWICE DAILY 180 tablet 2   colchicine  0.6 MG tablet Take 0.6 mg by mouth daily.     ELIQUIS  5 MG TABS tablet TAKE 1 TABLET TWICE DAILY 180 tablet 3   hydrochlorothiazide  (HYDRODIURIL ) 12.5 MG tablet Take 1 tablet (12.5 mg total) by mouth daily. 90 tablet 2   Multiple Vitamins-Calcium (DAILY VITAMINS FOR WOMEN PO) Take 1 tablet by mouth daily.     pravastatin  (PRAVACHOL ) 10 MG tablet Take 1 tablet (10 mg total) by mouth daily. 90 tablet 2   valsartan  (DIOVAN ) 320 MG tablet Take 1 tablet (320 mg total) by mouth daily. 90 tablet 2   No current facility-administered medications for this visit.    Allergies:   Penicillins    Social History:  The patient  reports that she has never smoked. She has never been exposed to tobacco smoke. She has never used smokeless tobacco. She reports that she does not drink alcohol and does not use drugs.   Family History:  The patient's family history includes Breast cancer in her maternal aunt and paternal aunt; Cancer in her maternal grandmother; Heart disease in her father and  maternal grandfather; Hyperlipidemia in her brother; Hypertension in her brother; Stroke in her mother and paternal grandfather; Thyroid  cancer in her paternal aunt.    ROS:  General:no colds or fevers currently, no weight changes Skin:no rashes or ulcers HEENT:one episode of blurred vision with rapid HR, no congestion CV:see HPI PUL:see HPI GI:no diarrhea constipation or melena, no indigestion GU:no hematuria, no dysuria MS:no joint pain, no claudication Neuro:no syncope, no lightheadedness Endo:no diabetes, no thyroid  disease  Wt Readings from Last 3 Encounters:  05/19/24 62.3 kg  03/21/24 62.1 kg  01/18/24 62.1 kg     PHYSICAL EXAM: VS:  There were no vitals taken for this visit. , BMI There is no height or weight on file to calculate BMI.  Affect appropriate Healthy:  appears stated age HEENT: palpable right thyroid   nodule  Neck supple with no adenopathy JVP normal no bruits no thyromegaly Lungs clear with no wheezing and good diaphragmatic motion Heart:  S1/S2 1/6 SEM murmur, no rub, gallop or click PMI normal Abdomen: benighn, BS positve, no tenderness, no AAA no bruit.  No HSM or HJR Distal pulses intact with no bruits No edema Neuro non-focal Skin warm and dry No muscular weakness  EKG:    09/05/2024 afib rate 65 otherwise normal   Recent Labs: 01/18/2024: BUN 23; Creatinine, Ser 1.14; Potassium 4.0; Sodium 142    Lipid Panel    Component Value Date/Time   CHOL 181 01/18/2024 1021   TRIG 91 01/18/2024 1021   HDL 57 01/18/2024 1021   CHOLHDL 3.2 01/18/2024 1021   LDLCALC 107 (H) 01/18/2024 1021     Other studies Reviewed: Additional studies/ records that were reviewed today include: . Echo 08/2018 Study Conclusions   - Left ventricle: The cavity size was normal. Systolic function was   normal. The estimated ejection fraction was in the range of 60%   to 65%. Wall motion was normal; there were no regional wall   motion abnormalities. - Mitral valve: Valve area by continuity equation (using LVOT   flow): 2.52 cm^2. - Left atrium: The atrium was severely dilated. - Right atrium: The atrium was mildly dilated. - Pulmonary arteries: Systolic pressure was within the normal   range. PA peak pressure: 34 mm Hg (S).    ASSESSMENT AND PLAN:  1.  Afib: permanent rate control and anticoagulation good Severe LAE failed [facility] Labs done by primary  [name] q 6 months  EF preserved by TTE 07/29/22 Will ask pharm D to review cost of her DOAC  2. Hx of COVID CXR 08/21/19 NAD Received vaccine April 2021      3.  Murmur with clindamycin for dental procedures with hx of rheumatic fever. Echo done 07/29/22 with just mild MR AV sclerosis and trivial AR   4  HTN : Well controlled.  Continue current medications and low sodium Dash type diet.    5. HLD:  On statin labs with primary Discussed  utility of calcium score  LDL 107   6. Gout:  post rx steroid injection and colchicine  has f/u with rheumatology on allopurinol  and adhering to low purine diet Sees DR Lonni Ester Rheumatology   7. Thyroid :  2 cm right nodule for biopsy f/u endocrine/primary Benign follicular nodule  8. Neuropathy:  peripheral in feet f/u primary for labs ? Start Gabapentin  Nerve stim testing was normal 03/23/24   9. DM-2  diet control for now A1c 05/19/24 was 5.8    Disposition:   FU: 6 months  Signed, Maude Emmer, MD  09/05/2024 8:53 AM    [facility] [address] [place], [place], [place]  [address]/ [address] [place], [place] Phone: [phone]; Fax: [phone]  [phone] "

## 2024-09-05 ENCOUNTER — Ambulatory Visit: Attending: Cardiovascular Disease | Admitting: Cardiovascular Disease

## 2024-09-05 ENCOUNTER — Encounter: Payer: Self-pay | Admitting: Cardiovascular Disease

## 2024-09-05 VITALS — BP 163/75 | HR 65 | Ht 62.0 in | Wt 138.6 lb

## 2024-09-05 DIAGNOSIS — I10 Essential (primary) hypertension: Secondary | ICD-10-CM | POA: Diagnosis present

## 2024-09-05 DIAGNOSIS — E782 Mixed hyperlipidemia: Secondary | ICD-10-CM | POA: Diagnosis not present

## 2024-09-05 DIAGNOSIS — I482 Chronic atrial fibrillation, unspecified: Secondary | ICD-10-CM | POA: Insufficient documentation

## 2024-09-05 DIAGNOSIS — I4821 Permanent atrial fibrillation: Secondary | ICD-10-CM | POA: Insufficient documentation

## 2024-09-05 NOTE — Patient Instructions (Signed)
 Medication Instructions:  Your physician recommends that you continue on your current medications as directed. Please refer to the Current Medication list given to you today.  *If you need a refill on your cardiac medications before your next appointment, please call your pharmacy*  Follow-Up: At [facility], you and your health needs are our priority.  As part of our continuing mission to provide you with exceptional heart care, our providers are all part of one team.  This team includes your primary Cardiologist (physician) and Advanced Practice Providers or APPs (Physician Assistants and Nurse Practitioners) who all work together to provide you with the care you need, when you need it.  Your next appointment:   6 month(s)  Provider:   Janelle Mediate, MD  We recommend signing up for the patient portal called "MyChart".  Sign up information is provided on this After Visit Summary.  MyChart is used to connect with patients for Virtual Visits (Telemedicine).  Patients are able to view lab/test results, encounter notes, upcoming appointments, etc.  Non-urgent messages can be sent to your provider as well.   To learn more about what you can do with MyChart, go to [email or web address].

## 2024-09-14 ENCOUNTER — Telehealth: Payer: Self-pay | Admitting: Cardiovascular Disease

## 2024-09-14 DIAGNOSIS — I482 Chronic atrial fibrillation, unspecified: Secondary | ICD-10-CM

## 2024-09-14 NOTE — Telephone Encounter (Signed)
 Patient calling to f/u on refill request. Please advise.

## 2024-09-15 NOTE — Telephone Encounter (Signed)
 Prescription refill request for Eliquis  received. Indication: AFIB Last office visit: 09/05/24 Scr: 1.14 (01/2024) Age: 79 Weight: 62.9KG  Eliquis  refill sent.

## 2024-10-17 ENCOUNTER — Ambulatory Visit (HOSPITAL_BASED_OUTPATIENT_CLINIC_OR_DEPARTMENT_OTHER)
